# Patient Record
Sex: Female | Born: 1935
Health system: Southern US, Community
[De-identification: ages and names within clinical notes are randomized; demographics above are authoritative.]

## PROBLEM LIST (undated history)

## (undated) DIAGNOSIS — F419 Anxiety disorder, unspecified: Secondary | ICD-10-CM

## (undated) DIAGNOSIS — I10 Essential (primary) hypertension: Secondary | ICD-10-CM

## (undated) DIAGNOSIS — R200 Anesthesia of skin: Secondary | ICD-10-CM

## (undated) DIAGNOSIS — E785 Hyperlipidemia, unspecified: Secondary | ICD-10-CM

## (undated) DIAGNOSIS — E876 Hypokalemia: Secondary | ICD-10-CM

## (undated) DIAGNOSIS — R770 Abnormality of albumin: Secondary | ICD-10-CM

## (undated) HISTORY — DX: Anesthesia of skin: R20.0

## (undated) HISTORY — DX: Abnormality of albumin: R77.0

## (undated) HISTORY — DX: Essential (primary) hypertension: I10

## (undated) HISTORY — PX: LUMBAR DISC SURGERY: SHX700

## (undated) HISTORY — DX: Anxiety disorder, unspecified: F41.9

## (undated) HISTORY — DX: Hypokalemia: E87.6

## (undated) HISTORY — DX: Hyperlipidemia, unspecified: E78.5

## (undated) HISTORY — DX: Hypomagnesemia: E83.42

---

## 2003-07-18 ENCOUNTER — Emergency Department (HOSPITAL_COMMUNITY): Admission: EM | Admit: 2003-07-18 | Discharge: 2003-07-18 | Payer: Self-pay | Admitting: Emergency Medicine

## 2004-05-24 ENCOUNTER — Encounter: Admission: RE | Admit: 2004-05-24 | Discharge: 2004-05-24 | Payer: Self-pay | Admitting: Geriatric Medicine

## 2004-07-05 ENCOUNTER — Other Ambulatory Visit: Admission: RE | Admit: 2004-07-05 | Discharge: 2004-07-05 | Payer: Self-pay | Admitting: Geriatric Medicine

## 2005-03-25 ENCOUNTER — Encounter: Admission: RE | Admit: 2005-03-25 | Discharge: 2005-03-25 | Payer: Self-pay | Admitting: Geriatric Medicine

## 2005-04-21 ENCOUNTER — Inpatient Hospital Stay (HOSPITAL_COMMUNITY): Admission: EM | Admit: 2005-04-21 | Discharge: 2005-04-22 | Payer: Self-pay | Admitting: *Deleted

## 2005-08-07 ENCOUNTER — Encounter: Admission: RE | Admit: 2005-08-07 | Discharge: 2005-08-07 | Payer: Self-pay | Admitting: Geriatric Medicine

## 2005-12-11 ENCOUNTER — Encounter: Admission: RE | Admit: 2005-12-11 | Discharge: 2005-12-11 | Payer: Self-pay | Admitting: Geriatric Medicine

## 2006-10-09 ENCOUNTER — Other Ambulatory Visit: Admission: RE | Admit: 2006-10-09 | Discharge: 2006-10-09 | Payer: Self-pay | Admitting: Geriatric Medicine

## 2007-04-16 ENCOUNTER — Encounter: Admission: RE | Admit: 2007-04-16 | Discharge: 2007-04-16 | Payer: Self-pay | Admitting: Geriatric Medicine

## 2007-04-27 ENCOUNTER — Encounter: Admission: RE | Admit: 2007-04-27 | Discharge: 2007-04-27 | Payer: Self-pay | Admitting: Geriatric Medicine

## 2007-08-17 ENCOUNTER — Encounter: Admission: RE | Admit: 2007-08-17 | Discharge: 2007-08-17 | Payer: Self-pay | Admitting: Geriatric Medicine

## 2007-12-24 ENCOUNTER — Emergency Department (HOSPITAL_COMMUNITY): Admission: EM | Admit: 2007-12-24 | Discharge: 2007-12-24 | Payer: Self-pay | Admitting: Emergency Medicine

## 2008-10-24 ENCOUNTER — Other Ambulatory Visit: Admission: RE | Admit: 2008-10-24 | Discharge: 2008-10-24 | Payer: Self-pay | Admitting: Geriatric Medicine

## 2009-11-02 ENCOUNTER — Encounter: Admission: RE | Admit: 2009-11-02 | Discharge: 2009-11-02 | Payer: Self-pay | Admitting: Gastroenterology

## 2010-07-02 NOTE — H&P (Signed)
[name] [name], [name] [name]               ACCOUNT NO.:  [id number]   MEDICAL RECORD NO.:  [id number]          PATIENT TYPE:  AMB   LOCATION:  SDC                           FACILITY:  [facility]   PHYSICIAN:  Charles A. Delcambre, MDDATE OF BIRTH:  [birth year]   DATE OF ADMISSION:  DATE OF DISCHARGE:                              HISTORY & PHYSICAL   CHIEF COMPLAINT:  Cystocele.   HISTORY OF PRESENT ILLNESS:  A 77 year old para 1-0-0-1, postmenopausal  female who presents complaining of 1-2 months of a bulge at her  introitus that is bothersome, rubs on her clothes, becomes irritated,  and just a pressure type of feeling.  She has seen her PCP, Dr.  Pete Glatter and PA and Dorethea Clan, seen in the past but now it is getting  worse so she is referred here for management.  She has minimal stress  urinary incontinence and no bowel changes.   PAST MEDICAL HISTORY:  1. Hypertension.  2. Hypercholesterolemia.   MEDICATION:  1. Verapamil ER one and half tablets per day.  2. Quinapril 20 mg 2 tablets per day.  3. Hydrochlorothiazide 25 mg one half tablet daily.  4. Lipitor 20 mg daily.  5. Calcium 600 mg 2 tablets daily.  6. Aspirin 81 mg once daily.  7. Multivitamin once daily.  8. Omeprazole once daily.   SURGICAL HISTORY:  Lumbar fourth and fifth disease.   ALLERGIES:  PENICILLIN actually not specified.   SOCIAL HISTORY:  She no longer smokes.  She has smoked for 20 years.  No  alcohol or drug use.  She is not sexually active.   FAMILY HISTORY:  Hypertension and osteoporosis.   REVIEW OF SYSTEMS:  Stomach and bowel problems, history of colitis,  otherwise no chest pain, mitral valve, shortness of breath, chronic  cough, liver problems, gallbladder problems, depression, bleeding  disorder, anemia, breast lump or discharge or vaginal discharge, pain  with intercourse, history of STDs, acne or skin problems, no migraine  headaches, seizures, epilepsy, numbness in her arms and legs  (recurring), no  unexplained weight gain or weight loss, currently  without night sweats or hot flashes.   PHYSICAL EXAMINATION:  GENERAL:  Alert and oriented x3.  VITAL SIGNS:  Blood pressure 150/84, heart rate 60, respirations 18, and  weight 140 pounds.  CORONARY:  Regular rate and rhythm without murmur, rub or gallop.  LUNGS:  Clear bilaterally.  ABDOMEN:  Soft, flat, and nontender.  PELVIC:  Normal external female genitalia.  Bartholin, urethral and  Skene within normal limits.  Moderate-to-severe cystocele is noted  prolapsing out the introitus.  Palpating the vaginal vault, I can  palpate the cervix deep and high without descent with Valsalva and at  about 4-5 o'clock, there is a tight band almost going from the cervix to  the sidewall posteriorly almost like an exposed uterosacral ligament  would be if it wished to be so present.  Area was nontender.  Valsalva,  the uterus did not move downward and it was difficult to see because of  the cystocele   ASSESSMENT:  Cystocele.   PLAN:  I  discussed pessary versus expectant management versus surgical  management.  She opted for surgical management with the uterus high and  no significant rectocele will only proceed with cystocele repair.  She  will return for preoperative management excepting this heart and lung  exam, accepts risks of infection, bleeding, blood product risk including  hepatitis and HIV exposure, failed procedure, increased incontinence  rate, increased urge incontinence, bladder injury and postoperative  retaining catheter with inability to void transiently.  All questions  were answered and surgery is scheduled for October 11, 2007, at 12 noon.      Charles A. Sydnee Cabal, MD  Electronically Signed     CAD/MEDQ  D:  09/20/2007  T:  09/21/2007  Job:  [id number]

## 2010-07-05 NOTE — Discharge Summary (Signed)
[name] [name], [name] [name]               ACCOUNT NO.:  [id number]   MEDICAL RECORD NO.:  [id number]          PATIENT TYPE:  INP   LOCATION:  4740                         FACILITY:  [facility]   PHYSICIAN:  Lyn Records, M.D.   DATE OF BIRTH:  26-Aug-1929   DATE OF ADMISSION:  04/21/2005  DATE OF DISCHARGE:  04/22/2005                                 DISCHARGE SUMMARY   ADMISSION DIAGNOSIS:  1.  Left shoulder pain, left neck pain, left arm discomfort, and chest pain.   DISCHARGE DIAGNOSES:  1.  Left shoulder blade pain, left neck pain, left arm discomfort, and chest      pain, resolved.  2.  Dyspnea, resolved.  3.  Hypertension.  4.  Dyslipidemia.  5.  Gastroesophageal reflux disease.  6.  Chronic obstructive pulmonary disease-emphysema.   PROCEDURES:  Stress Cardiolite.   HOSPITAL COURSE:  [name] [name] is a 77 year old Caucasian female with no  known history of coronary artery disease; however, she does have a history  of hypertension, dyslipidemia, GERD, and COPD/emphysema.  She presented to  the emergency department on yesterday after a two-day history of nausea,  flatulence and bloating and then a sudden onset on Sunday evening of  nonexertional left shoulder blade pain rated a 7/10.  She also complained of  left neck pain, left arm discomfort and chest pain that was dull in quality  and rated 3/10.  Associated with the chest pain was shortness of breath,  diaphoresis, and dizziness without syncope.  The duration of the chest pain  was a few seconds.  The symptoms recurred on Monday morning; however, the  chest pain was rated a 4/10.  Upon arrival to the [facility] emergency  department by private car, she was given sublingual nitroglycerin x2 plus  aspirin 81 mg x4 plus Zofran 4 mg plus morphine 2 mg plus Dilaudid 1 mg/hr.,  with complete pain relief.  Her EKG in the emergency department revealed  normal sinus rhythm with a heart rate of 64 beats per minute.  The patient  reported  that her left shoulder pain was aggravated with movement and  palpation.  The patient also reported that she previously had a normal  thallium stress test in the past.  Her initial point of care enzymes were  negative x2 and she was currently pain-free.  On today, she underwent a  stress Cardiolite exercise test with normal results and an EF of 91%.  Her  serial enzymes including CK total, CK-MB and troponin I were negative x2.  The patient is being discharged to home today in stable condition with no  further cardiac workup needed.   LABORATORY DATA:  Most recent BMET:  Sodium 137, potassium 3.9, chloride 99,  CO2 29, glucose of 101, BUN 8, creatinine 0.8, calcium 9.7.  White blood  count 6.1, red blood count 4.84, hemoglobin 14.6, hematocrit 41.7, platelets  255.  Fasting lipid profile:  Total cholesterol 238, triglycerides 220, HDL  cholesterol 52, LDL cholesterol 142.  BNP less than 30.  Magnesium 2.5.  PT  12.2, INR 0.9, PTT 24.  X-RAYS:  Chest x-ray single view, April 21, 2005:  (1) Emphysema.  (2) No  acute cardiopulmonary process.   EKG on April 22, 2005:  Normal sinus rhythm with early repolarization.   CONDITION UPON DISCHARGE:  [name] [name] is currently left shoulder pain, left  neck pain, left arm discomfort and chest pain-free.  She has ambulated well  without angina and is being discharged to home today in stable condition.   DISCHARGE MEDICATIONS:  1.  Accupril 20 mg twice daily.  2.  Verapamil 240 mg one tablet in the a.m. and one-half tablet in the p.m.  3.  Lipitor 20 mg daily.  4.  Hydrochlorothiazide 25 mg one-half tablet daily.  5.  Aspirin 81 mg daily.  6.  Nexium 40 mg daily.  7.  Calcium 600 mg three times daily.  8.  Vitamin E 400 international units daily.  9.  Vitamin B daily.   DISCHARGE INSTRUCTIONS:  It is recommended that the patient follow up with  her primary care physician, Hal T. Stoneking, M.D.  The patient has been  instructed to follow a  low-salt and low-cholesterol diet.   FOLLOW-UP ARRANGEMENTS:  1.  There is no further cardiac workup necessary.  2.  The patient has been instructed to follow up with her primary care      physician, Dr. Merlene Laughter.  The patient will need to schedule an      appointment with Dr. Merlene Laughter.      Tylene Fantasia, [place]      Lyn Records, M.D.  Electronically Signed    RDM/MEDQ  D:  04/22/2005  T:  04/23/2005  Job:  [id number]   cc:   Hal T. Stoneking, M.D.  Fax: [phone]

## 2010-07-05 NOTE — H&P (Signed)
NAMESHIRLINE, [name] [name]               ACCOUNT NO.:  [id number]   MEDICAL RECORD NO.:  [id number]          PATIENT TYPE:  INP   LOCATION:  4740                         FACILITY:  [facility]   PHYSICIAN:  Lyn Records, M.D.   DATE OF BIRTH:  January 23, 1930   DATE OF ADMISSION:  04/21/2005  DATE OF DISCHARGE:                                HISTORY & PHYSICAL   CARDIOLOGIST:  Verdis Prime, M.D.   PRIMARY CARE PHYSICIAN:  Merlene Laughter, M.D.   CHIEF COMPLAINT:  Left shoulder pain, left arm discomfort, and chest pain.   HISTORY AND PHYSICAL:  [name] [name] is a 77 year old Caucasian female with  no known history of coronary artery disease; however, she does have a  history of hypertension, dyslipidemia, and GERD.  She complained of a two-  day history of nausea, flatulence, and bloating.  On yesterday at 5 p.m. she  had a sudden onset of non-exertional left shoulder blade pain.  The pain  felt as if like someone was punching me in the back.  She rated the pain a  7/10.  She also complained of left neck pain as well as left arm discomfort.  She denies heaviness, numbness, or tingling in her left arm.  She also  complained of a dull ache in her chest rated a 3/10.  Accompanied with the  chest pain was shortness of breath, diaphoresis, and dizziness.  She denied  syncope.  The duration of the chest pain was a few seconds.  This morning  the symptoms recurred; however, the chest pain was rated a 4/10.  She  arrived to the [facility] Emergency Department by private car.  Upon arrival  she was given sublingual nitroglycerin x2 plus aspirin 81 mg x4 plus Zofran  4 mg plus morphine 2 mg plus Dilaudid 1 mg per hour with complete pain  relief.  Upon arrival an EKG was obtained and revealed normal sinus rhythm  with a ventricular rate of 64 beats per minute.  Point of care enzymes were  negative x2.  The patient admitted to currently being pain-free; however,  the left shoulder pain is aggravated with movement  and with palpation.  The  patient also reported a normal thallium stress test in the past.   PAST MEDICAL HISTORY:  1.  Hypertension.  2.  Dyslipidemia.  3.  GERD.   ALLERGIES:  PENICILLIN, questionable CONTRAST DYE allergy.   HOME MEDICATIONS:  1.  Verapamil 360 mg daily.  2.  Lipitor 20 mg daily.  3.  Accupril 20 mg daily.  4.  Hydrochlorothiazide 25 mg daily.  5.  Miacalcin.  6.  Calcium chloride.  7.  Aspirin 81 mg daily.   PAST SURGICAL HISTORY:  Lower back surgery L4-L5 in 1987.   FAMILY HISTORY:  Father deceased age 28, cancer.  Mother deceased age 38,  CHF, bradycardia status post permanent pacemaker, hypertension.  Sister  deceased, emphysema.   SOCIAL HISTORY:  [name] [name] is married and lives with her husband in a  retirement community in [place].  The couple retired to [place] two  years ago from [place],  [place].  [name] [name] walks three miles a  day five times per week.  She was a former tobacco user, however, smoking  cessation occurred 15 years ago.  She denies alcohol and illicit drug use.   REVIEW OF SYSTEMS:  Positive for chest pain, shortness of breath,  diaphoresis, dizziness, nausea, flatulence, left shoulder blade pain, left  neck pain, left arm discomfort, left leg and knee pain.  She denies vomiting  and syncope.  All other systems reviewed are negative other than what is  stated in the HPI.   PHYSICAL EXAMINATION:  GENERAL:  [name] [name] is a 77 year old Caucasian  female who is pleasant and cooperative and NAD.  VITAL SIGNS:  Temperature 96.8, pulse 54, respirations 18, blood pressure  140/72, O2 saturation 98% over 4 L.  HEENT:  Unremarkable.  NECK:  No JVD or carotid bruits.  LUNGS:  Breath sounds are equal and clear to auscultation without wheezes,  rhonchi, crackles, or rales.  HEART:  Regular rate and rhythm.  S1 and S2 normal without murmurs, gallops,  clicks, or rubs.  ABDOMEN:  Soft, nontender, nondistended with active  bowel sounds.  No aortic  or iliac bruits.  No masses or hepatomegaly.  EXTREMITIES:  No peripheral edema.  DP and PT pulses 2+/2 bilaterally.  SKIN:  Warm and dry without rashes or lesions.  BACK:  No kyphosis or scoliosis.  NEUROLOGIC:  Alert and oriented x3.  No focal deficits.  Normal gait.  PSYCH:  Normal mood and affect.   LABORATORY DATA:  Sodium 136, potassium 3.9, chloride 104, CO2 30.4, BUN 12,  creatinine 0.4, glucose 95.  Point of care cardiac markers:  Myoglobin 82.8  and 57.3, respectively, CK-MB 3.1 and 1.3, respectively, troponin I less  than 0.05 and less than 0.05, respectively.  Chest x-ray:  COPD, emphysema  noted.  No cardiopulmonary disease process noted.  EKG:  Normal sinus rhythm  with a ventricular rate of 64 BPM.   ASSESSMENT:  1.  Chest pain.  2.  Dyspnea.  3.  Left arm pain.  4.  Left neck pain.  5.  Left shoulder pain.  6.  Hypertension.  7.  Dyslipidemia.  8.  Gastroesophageal reflux disease.  9.  Chronic obstructive pulmonary disease/emphysema.   PLAN:  1.  Rule out MI.  Obtain serial cardiac enzymes now and every eight hours      including CK total, CK-MB, and troponin I.  2.  Stress Cardiolite test in the a.m.  n.p.o. after midnight on April 21, 2005 except medications.  3.  EKG in the a.m. and with recurrent chest/back pain.  4.  Check BMET, PT, INR, PTT.  5.  Sublingual nitroglycerin 0.4 mg every five minutes x3 p.r.n. chest/back      pain.  6.  Initiate cardiac p.r.n. orders.  7.  Admit to a cardiac telemetry unit to Dr. Corky Sing service.      Diagnosis:  Unstable angina.  8.  Check CBC with differential.  9.  Check BNP.  10. Hold verapamil.  11. Start Norvasc 5 mg daily this p.m. in preparation for stress Cardiolite.  12. Start Protonix 40 mg daily.      Tylene Fantasia, [place]      Lyn Records, M.D.  Electronically Signed    RDM/MEDQ  D:  04/21/2005  T:  04/22/2005  Job:  [id number]  cc:   Hal T. Stoneking, M.D.   Fax: [phone]

## 2010-10-15 ENCOUNTER — Emergency Department (HOSPITAL_COMMUNITY): Payer: Medicare Other

## 2010-10-15 ENCOUNTER — Encounter (HOSPITAL_COMMUNITY): Payer: Self-pay

## 2010-10-15 ENCOUNTER — Inpatient Hospital Stay (HOSPITAL_COMMUNITY)
Admission: EM | Admit: 2010-10-15 | Discharge: 2010-10-17 | DRG: 641 | Disposition: A | Discharge status: Home / Self Care | Payer: Medicare Other | Attending: Internal Medicine | Admitting: Internal Medicine

## 2010-10-15 DIAGNOSIS — R209 Unspecified disturbances of skin sensation: Secondary | ICD-10-CM | POA: Diagnosis present

## 2010-10-15 DIAGNOSIS — E8809 Other disorders of plasma-protein metabolism, not elsewhere classified: Secondary | ICD-10-CM | POA: Diagnosis present

## 2010-10-15 DIAGNOSIS — Z7982 Long term (current) use of aspirin: Secondary | ICD-10-CM

## 2010-10-15 DIAGNOSIS — F411 Generalized anxiety disorder: Secondary | ICD-10-CM | POA: Diagnosis present

## 2010-10-15 DIAGNOSIS — Z79899 Other long term (current) drug therapy: Secondary | ICD-10-CM

## 2010-10-15 DIAGNOSIS — I10 Essential (primary) hypertension: Secondary | ICD-10-CM | POA: Diagnosis present

## 2010-10-15 DIAGNOSIS — D45 Polycythemia vera: Secondary | ICD-10-CM | POA: Diagnosis present

## 2010-10-15 DIAGNOSIS — E876 Hypokalemia: Principal | ICD-10-CM | POA: Diagnosis present

## 2010-10-15 DIAGNOSIS — Z87891 Personal history of nicotine dependence: Secondary | ICD-10-CM

## 2010-10-15 DIAGNOSIS — E785 Hyperlipidemia, unspecified: Secondary | ICD-10-CM | POA: Diagnosis present

## 2010-10-15 DIAGNOSIS — R1310 Dysphagia, unspecified: Secondary | ICD-10-CM | POA: Diagnosis present

## 2010-10-15 DIAGNOSIS — E860 Dehydration: Secondary | ICD-10-CM | POA: Diagnosis present

## 2010-10-15 DIAGNOSIS — K219 Gastro-esophageal reflux disease without esophagitis: Secondary | ICD-10-CM | POA: Diagnosis present

## 2010-10-15 HISTORY — DX: Essential (primary) hypertension: I10

## 2010-10-15 LAB — URINALYSIS, ROUTINE W REFLEX MICROSCOPIC
Ketones, ur: NEGATIVE mg/dL
Nitrite: NEGATIVE
Protein, ur: NEGATIVE mg/dL

## 2010-10-15 LAB — CALCIUM: Calcium: 5.6 mg/dL — CL (ref 8.4–10.5)

## 2010-10-15 LAB — BASIC METABOLIC PANEL
BUN: 7 mg/dL (ref 6–23)
BUN: 12 mg/dL (ref 6–23)
CO2: 18 mEq/L — ABNORMAL LOW (ref 19–32)
CO2: 27 mEq/L (ref 19–32)
Calcium: 5.4 mg/dL — CL (ref 8.4–10.5)
Calcium: 9.8 mg/dL (ref 8.4–10.5)
Chloride: 119 mEq/L — ABNORMAL HIGH (ref 96–112)
Creatinine, Ser: <0.47 mg/dL — ABNORMAL LOW (ref 0.50–1.10)
Glucose, Bld: 68 mg/dL — ABNORMAL LOW (ref 70–99)
Glucose, Bld: 105 mg/dL — ABNORMAL HIGH (ref 70–99)
Sodium: 139 mEq/L (ref 135–145)

## 2010-10-15 LAB — POTASSIUM: Potassium: 2.1 mEq/L — CL (ref 3.5–5.1)

## 2010-10-15 LAB — DIFFERENTIAL
Eosinophils Absolute: 0.1 10*3/uL (ref 0.0–0.7)
Lymphs Abs: 2.4 10*3/uL (ref 0.7–4.0)
Monocytes Absolute: 0.4 10*3/uL (ref 0.1–1.0)
Monocytes Relative: 7 % (ref 3–12)
Neutro Abs: 2 10*3/uL (ref 1.7–7.7)
Neutrophils Relative %: 41 % — ABNORMAL LOW (ref 43–77)

## 2010-10-15 LAB — CBC
Hemoglobin: 15.8 g/dL — ABNORMAL HIGH (ref 12.0–15.0)
MCH: 29.2 pg (ref 26.0–34.0)
MCHC: 35 g/dL (ref 30.0–36.0)
MCV: 83.4 fL (ref 78.0–100.0)
RBC: 5.41 MIL/uL — ABNORMAL HIGH (ref 3.87–5.11)

## 2010-10-15 LAB — MAGNESIUM: Magnesium: 1.3 mg/dL — ABNORMAL LOW (ref 1.5–2.5)

## 2010-10-15 LAB — TROPONIN I: Troponin I: <0.3 ng/mL (ref <0.30)

## 2010-10-15 LAB — ALBUMIN: Albumin: 2.3 g/dL — ABNORMAL LOW (ref 3.5–5.2)

## 2010-10-15 LAB — CK TOTAL AND CKMB (NOT AT ARMC): Total CK: 69 U/L (ref 7–177)

## 2010-10-15 LAB — URINE MICROSCOPIC-ADD ON

## 2010-10-15 NOTE — H&P (Signed)
NAMEPATRICIANN, BECHT               ACCOUNT NO.:  [id number]  MEDICAL RECORD NO.:  [id number]  LOCATION:  [facility]                         FACILITY:  [facility]  PHYSICIAN:  Andreas Blower, MD       DATE OF BIRTH:  Aug 06, 1929  DATE OF ADMISSION:  10/15/2010 DATE OF DISCHARGE:                             HISTORY & PHYSICAL   PRIMARY CARE PHYSICIAN:  Hal T. Stoneking, MD  CHIEF COMPLAINT:  Altered sensation on her left side.  HISTORY OF PRESENT ILLNESS:  Ms. Spadoni is a 77 year old Caucasian female with history of hypertension, hyperlipidemia, and GERD, who presented with the above complaints.  The patient reported that about 4 or 5 weeks ago she had gone to her primary care physician's office Dr. Pete Glatter at which time lab showed that she had elevated calcium.  She had cut down her calcium from 2 tablets daily to 1 tablet a day.  Also over the last month or so she has been having some pain in her left thigh and left knee.  She had gone to Dr. Ardyth Gal office with the Orthopedic Service and had workup done which was negative.  Over the last 2 days she has been feeling dizzy at home, but has not fallen or has not lost consciousness.  She woke up this morning at 4 o'clock with having altered sensation on the left side.  She also checked her blood pressure which was elevated at 210/100.  She took her home medications and as a result presented to the ER for further evaluation.  In the ER, the patient had MRI and MRA which did show any acute infarct.  However, labs did show that she was hypokalemic with a potassium of less than 2.0 and hypocalcemia with calcium of 5.4.  As a result, the Hospitalist Service was asked to admit the patient for further care and management. The patient denies any recent fevers or chills.  Denies any nausea or vomiting.  The patient while in the emergency department choked on the potassium tablet and Heimlich maneuver was performed.  Denies any abdominal pain.  Denies  any diarrhea, headaches, or vision changes.  REVIEW OF SYSTEMS:  All systems were reviewed with the patient was positive as per HPI, otherwise all other systems are negative.  PAST MEDICAL HISTORY: 1. Hypertension. 2. Hyperlipidemia. 3. GERD.  SOCIAL HISTORY:  The patient quit smoking about 20 years ago.  Denies any illegal drugs or substances.  Drinks one glass of wine every 2 weeks.  Lives at home with husband, who recently had surgery and had a lung resection.  FAMILY HISTORY:  Significant for mother dying from congestive heart failure at age 45.  Father died at the age of 47, likely from colon cancer.  Sister had COPD.  PHYSICAL EXAMINATION:  VITALS:  Temperature was 97.7, blood pressure is 140/57, heart rate 73, respirations 18, satting at 97% on 3 liters of oxygen. GENERAL:  The patient is alert, oriented, not appeared to be in acute distress, lying in bed comfortably. HEENT:  Extraocular motions are intact.  Pupils equal and round.  Had moist membranes. No oropharyn lesions. NECK:  Supple. HEART:  Regular with S1 and S2. LUNGS:  Clear to auscultation bilaterally. ABDOMEN:  Soft, nontender, nondistended.  Positive bowel sounds. EXTREMITIES:  The patient had good peripheral pulses with trace edema. NEURO:  Cranial nerves II-XII are grossly intact.  Had 5/5 motor strength in upper as well as lower extremities.  Trousseau and Chvostek sign was negative.  RADIOLOGY/IMAGING: 1. The patient had a head CT which was negative for bleed or other     acute intracranial process.  Right basal ganglia was small of     lacunar infarct. 2. The patient had an MRI and MRA of brain which was negative.  It     did show chronic ischemic changes.  LABORATORY DATA:  CBC shows a white count of 4.9, hemoglobin 15.8, hematocrit 45.1, and platelet count 181.  Electrolytes, sodium 143, potassium less than 2.0, chloride 119, CO2 of 18, BUN 7, creatinine less than 0.47, and calcium 5.4.   Initial troponin less than 0.30.  UA negative for nitrites and trace leukocytes.  ASSESSMENT AND PLAN: 1. Hypocalcemia, etiology unclear at this time.  The patient reports     that she has been taking supplemental calcium at home.  However,     has decreased the dose as her laboratory workup about for 5 weeks     ago showed hypercalcemia.  We will initiate hypokalemia workup, we     will send for phosphorus, albumin, PTH, vitamin B, 25 hydroxy, 24-     hour urine calcium, and LDH.  The patient's Chvostek and Trousseau     sign were negative.  We will give the patient calcium gluconate and     will observe. 2. Hypokalemia, etiology unclear.  We will send for a magnesium level.     We will replace the potassium aggressive with both IV and oral     potassium. 3. Dysphagia.  The patient showed on potassium pills.  We will change     oral potassium tablets to liquid.  We will also have the patient on     soft diet.  If the patient continued to have dysphagia, can consider     dysphagia workup with a speech therapy at that time.  The patient     was drinking and swallowing appropriately while she was in the     room. 4. Altered sensation on the left side likely due to hypokalemia     calcium and hypokalemia as the MRI is negative.  On my examination     with the patient, her sensation had improved. If the patient has     persistent symptoms, consider further evaluation with perhaps     Neurology consultation. 5. Hypertension not well controlled.  The patient is on verapamil     which will be held as the patient has hypocalcemia.  We will have     p.r.n. hydralazine.  The patient has accelerated hypertension,     likely due to stress from being here in the hospital.  We will     monitor blood pressure carefully and adjust medications as     needed. 6. Hyperlipidemia.  Continue statin. 7. Polycythemia suspect the patient may be mildly dehydrated. 8. Questionable dehydration.  We will check  orthostatics on admission.     We will hydrate the patient on IV fluids. 9. Gastroesophageal reflux disease.  Continue the patient on PPI. 10.Prophylaxis, Lovenox for DVT prophylaxis. 11.Code status.  The patient is full code.  TIME SPENT ON ADMISSION:  Talking to the patient's family and coordinating care was  1 hour.     Andreas Blower, MD     SR/MEDQ  D:  10/15/2010  T:  10/15/2010  Job:  [id number]  Electronically Signed by Wardell Heath Sharif Rendell  on 10/15/2010 11:33:21 PM

## 2010-10-16 LAB — CBC
Hemoglobin: 13.9 g/dL (ref 12.0–15.0)
MCHC: 34.1 g/dL (ref 30.0–36.0)
RDW: 13.1 % (ref 11.5–15.5)

## 2010-10-16 LAB — MAGNESIUM
Magnesium: 2.5 mg/dL (ref 1.5–2.5)
Magnesium: 2.5 mg/dL (ref 1.5–2.5)

## 2010-10-16 LAB — COMPREHENSIVE METABOLIC PANEL
Alkaline Phosphatase: 92 U/L (ref 39–117)
BUN: 10 mg/dL (ref 6–23)
Creatinine, Ser: 0.54 mg/dL (ref 0.50–1.10)
GFR calc Af Amer: >60 mL/min (ref >60)
Glucose, Bld: 112 mg/dL — ABNORMAL HIGH (ref 70–99)
Potassium: 4.1 mEq/L (ref 3.5–5.1)
Total Bilirubin: 0.4 mg/dL (ref 0.3–1.2)
Total Protein: 7.1 g/dL (ref 6.0–8.3)

## 2010-10-16 LAB — VITAMIN D 25 HYDROXY (VIT D DEFICIENCY, FRACTURES): Vit D, 25-Hydroxy: 44 ng/mL (ref 30–89)

## 2010-10-17 LAB — BASIC METABOLIC PANEL
GFR calc Af Amer: >60 mL/min (ref >60)
GFR calc non Af Amer: >60 mL/min (ref >60)
Potassium: 4.1 mEq/L (ref 3.5–5.1)
Sodium: 135 mEq/L (ref 135–145)

## 2010-10-17 LAB — LIPID PANEL
Cholesterol: 196 mg/dL (ref 0–200)
HDL: 47 mg/dL (ref >39)
Triglycerides: 203 mg/dL — ABNORMAL HIGH (ref <150)

## 2010-10-17 LAB — CBC
MCHC: 33.8 g/dL (ref 30.0–36.0)
RDW: 13.2 % (ref 11.5–15.5)

## 2010-10-17 LAB — VITAMIN B12: Vitamin B-12: 916 pg/mL — ABNORMAL HIGH (ref 211–911)

## 2010-10-17 LAB — CALCIUM, URINE, 24 HOUR: Calcium, 24 hour urine: 84 mg/d — ABNORMAL LOW (ref 100–250)

## 2010-10-19 LAB — PTH-RELATED PEPTIDE

## 2010-11-06 NOTE — Discharge Summary (Signed)
[name], [name]               ACCOUNT NO.:  [id number]  MEDICAL RECORD NO.:  [id number]  LOCATION:  3703                         FACILITY:  [facility]  PHYSICIAN:  Rosanna Randy, MDDATE OF BIRTH:  Nov 02, 1929  DATE OF ADMISSION:  10/15/2010 DATE OF DISCHARGE:  10/17/2010                              DISCHARGE SUMMARY   DISCHARGE DIAGNOSES: 1. Accelerated hypertension. 2. Hypokalemia. 3. Hypomagnesemia. 4. Hypokalemia. 5. Hyperlipidemia. 6. Mild low albumin. 7. Gastroesophageal reflux disease. 8. Anxiety/moderate to severe stress. 9. Numbness of the left side secondary to electrolytes abnormalities.  DISCHARGE MEDICATIONS: 1. Xanax 0.25 mg to take 1 tablet by mouth q.12 h as needed for     anxiety. 2. Ensure 237 mL by mouth daily. 3. HCTZ 25 mg 1 tablet by mouth daily. 4. Amlodipine 10 mg 1 tablet by mouth daily. 5. Aspirin 81 mg 1 tablet by mouth daily. 6. Co-Q 10 over the counter 1 tablet by mouth daily. 7. Crestor 5 mg 1 tablet by mouth daily. 8. Fish oil 1 capsule by mouth daily. 9. Metronidazole cream 0.75% one application transdermally daily to     the affected area on her skin. 10.Multivitamins 1 tablet by mouth daily. 11.Omeprazole 20 mg 1 capsule by mouth daily. 12.Quinapril  2 tablets by mouth daily.  DISPOSITION AND FOLLOWUP:  The patient had been discharged in a stable improved condition:  Not having any acute complaints.  She is going to arrange a followup appointment with primary care physician Dr. Pete [name] over the next 7-10 days.  During that appointment, will be important to repeat a basic metabolic panel to make sure that no further abnormalities of the range into her electrolyte to renal function has happened since discharge.  The patient will also need a followup of her blood pressure and have further adjustment on her medications depending the level.  The patient was advised to follow a low-fat diet and also to follow a low-sodium diet in  order to help with her cholesterol and blood pressure problems.  PROCEDURE PERFORMED DURING THIS HOSPITALIZATION:  On August 28th she had a CT of the head without contrast that demonstrated no acute bleeding or intracranial process.  There was a right basal ganglia small lacunar infarct, age indeterminate and nonspecific deep white matter changes observed during this study.  An MRI of the brain demonstrated chronic ischemic changes without any acute infarct and the MRA was negative.  A chest x-ray done August 28th demonstrated chronic lung changes without acute borderline pulmonary process.  No other procedures were performed during this hospitalization.  No consultations were made during this hospitalization.  HISTORY OF PRESENT ILLNESS:  For full details, please refer to dictation done by Dr. [name] on October 15, 2010, but briefly, this is a 77 year old female with past medical history significant for hypertension, hyperlipidemia and gastroesophageal reflux disease, who presents with an altered sensation on the left side of her body and headaches.  The patient woke up on the day of admission reporting that she had been feeling a little bit dizzy at home, but she did not had any falls or loss of consciousness.  This is something that had been done for the last  2 days prior to admission.  She woke up on the day of admission around 4 o'clock with having altered sensation of the left side and also headache, blood pressure was checked at that time and was 210/100.  The patient took her medications that she normally take for blood pressure, even that it was slightly to be ahead of time for her usual pattern and come to the emergency department for further evaluation and treatment. The patient was admitted for further evaluation.  PERTINENT LABORATORY DATA THROUGHOUT THIS HOSPITALIZATION:  The patient had a CBC with differential on admission with a white blood cells of 4.9, hemoglobin  15.8, platelets 181.  Urinalysis was essentially negative with a microscopy showing 0-2 white blood cells and 0-2 red blood cells.  Cardiac markers negative x3.  Her BMET on admission showed sodium of 143, potassium 2.0, chloride 119, bicarb 18, glucose 68, BUN 7, creatinine 0.47, calcium 5.4, magnesium was 1.3, albumin 2.3, LDH 148, phosphorus was 1.6.  The patient had subsequent BMET and CBC throughout this hospitalization following on cardiac lab work with a BMET at discharge showing a sodium of 135, potassium 4.1, chloride 99, bicarb 25, glucose 103, BUN 16, creatinine 0.68, calcium was 10.6.  A lipid profile done during this admission showed a total cholesterol 196, triglycerides 203, HDL of 47, LDL 108, and magnesium at discharge 2.4 and vitamin D was 44.  Magnesium at discharge 2.5.  HOSPITAL COURSE BY PROBLEMS: 1. The patient's accelerated hypertension, she was treated with     hydralazine and adjustment has been made to her medications to make     easier and to followup as an outpatient.  We have adjust her HCTZ     to 25 mg daily.  We have changed her verapamil to Norvasc 10 mg     daily and she is going to continue using her lisinopril.  She will     follow a low-sodium diet and will follow with primary care     physician Dr. Pete [name] for further adjustment as an outpatient. 2. Hypomagnesemia, hypokalemia, electrolytes were repleted during this     admission. 3. Hypocalcemia which was also repleted and back to normal.  She had a     PTH check with value of 38.6, vitamin D of 44, at this point, after     repletion calcium is a baseline.  Plan is to have a calcium     repeated as an outpatient, at the moment that she will follow with     Dr. Pete [name] and have further adjustment and treatment if needed. 4. Hyperlipidemia.  A fasting lipid profile demonstrated well control     of her cholesterol level.  She will continue using her statins and     also Co-Q10. 5.  Gastroesophageal reflux disease.  We are going to continue PPI. 6. Low albumin.  We are going to recommend the use of supplemental     Ensure. 7. Anxiety/stress all driven by the situation that she is right now     involved with a disease of her husband who had been diagnosed with     a lung cancer and is status post a lobectomy of the right lung.  At     this point, we had started treatment with low dose Xanax q.12 h as     needed, but is something that needs to be followed on adjusted     depending improvement by Dr. Pete [name] as an outpatient. 8. Numbness of the  left side of her body, almost likely secondary to     the electrolytes abnormalities, specifically the low potassium and     low calcium.  Workup including CT and MRI to rule out strokes were     negative.  At this point, no further treatment or workup will be     needed for this problem.  Discharge Vital Signs:  Temperature 97.8, heart rate 73, respiratory rate 16, blood pressure 144/82, oxygen saturation 96% on room air.  In general, the patient in no acute distress.  Respiratory system, clear to auscultation bilaterally with just a scattered rhonchi.  Heart: Regular rate and rhythm.  No murmurs or gallops.  Abdomen: Soft, nontender with positive bowel sounds.  Extremities: There was no edema or cyanosis.  Neurologic exam was nonfocal.     Rosanna Randy, MD     CEM/MEDQ  D:  10/17/2010  T:  10/17/2010  Job:  [id number]  cc:   Hal T. Stoneking, M.D.  Electronically Signed by Vassie Loll MD on 11/06/2010 08:18:53 AM

## 2010-11-19 LAB — URINALYSIS, ROUTINE W REFLEX MICROSCOPIC
Bilirubin Urine: NEGATIVE
Nitrite: NEGATIVE
Specific Gravity, Urine: 1.006
Urobilinogen, UA: 0.2

## 2010-11-19 LAB — CBC
HCT: 44
Platelets: 234
RDW: 13.3

## 2010-11-19 LAB — DIFFERENTIAL
Basophils Absolute: 0.1
Eosinophils Absolute: 0.1
Eosinophils Relative: 1
Lymphocytes Relative: 22

## 2010-11-19 LAB — POCT I-STAT, CHEM 8
BUN: 12
Hemoglobin: 15
Sodium: 141
TCO2: 30

## 2010-11-19 LAB — URINE MICROSCOPIC-ADD ON

## 2011-03-05 DIAGNOSIS — R198 Other specified symptoms and signs involving the digestive system and abdomen: Secondary | ICD-10-CM | POA: Diagnosis not present

## 2011-03-11 DIAGNOSIS — R252 Cramp and spasm: Secondary | ICD-10-CM | POA: Diagnosis not present

## 2011-03-11 DIAGNOSIS — R300 Dysuria: Secondary | ICD-10-CM | POA: Diagnosis not present

## 2011-03-11 DIAGNOSIS — R109 Unspecified abdominal pain: Secondary | ICD-10-CM | POA: Diagnosis not present

## 2011-03-11 DIAGNOSIS — M810 Age-related osteoporosis without current pathological fracture: Secondary | ICD-10-CM | POA: Diagnosis not present

## 2011-03-11 DIAGNOSIS — I10 Essential (primary) hypertension: Secondary | ICD-10-CM | POA: Diagnosis not present

## 2011-03-11 DIAGNOSIS — Z79899 Other long term (current) drug therapy: Secondary | ICD-10-CM | POA: Diagnosis not present

## 2011-03-21 ENCOUNTER — Other Ambulatory Visit: Payer: Self-pay | Admitting: Gastroenterology

## 2011-03-21 DIAGNOSIS — R109 Unspecified abdominal pain: Secondary | ICD-10-CM | POA: Diagnosis not present

## 2011-03-25 ENCOUNTER — Ambulatory Visit
Admission: RE | Admit: 2011-03-25 | Discharge: 2011-03-25 | Disposition: A | Discharge status: Home / Self Care | Payer: Medicare Other | Source: Ambulatory Visit | Attending: Gastroenterology | Admitting: Gastroenterology

## 2011-03-25 DIAGNOSIS — R109 Unspecified abdominal pain: Secondary | ICD-10-CM | POA: Diagnosis not present

## 2011-03-25 DIAGNOSIS — K5730 Diverticulosis of large intestine without perforation or abscess without bleeding: Secondary | ICD-10-CM | POA: Diagnosis not present

## 2011-03-25 DIAGNOSIS — N281 Cyst of kidney, acquired: Secondary | ICD-10-CM | POA: Diagnosis not present

## 2011-03-28 DIAGNOSIS — M810 Age-related osteoporosis without current pathological fracture: Secondary | ICD-10-CM | POA: Diagnosis not present

## 2011-04-01 ENCOUNTER — Other Ambulatory Visit (HOSPITAL_COMMUNITY)
Admission: RE | Admit: 2011-04-01 | Discharge: 2011-04-01 | Disposition: A | Discharge status: Home / Self Care | Payer: Medicare Other | Source: Ambulatory Visit | Attending: Geriatric Medicine | Admitting: Geriatric Medicine

## 2011-04-01 ENCOUNTER — Other Ambulatory Visit: Payer: Self-pay | Admitting: Geriatric Medicine

## 2011-04-01 DIAGNOSIS — Z1159 Encounter for screening for other viral diseases: Secondary | ICD-10-CM | POA: Insufficient documentation

## 2011-04-01 DIAGNOSIS — N949 Unspecified condition associated with female genital organs and menstrual cycle: Secondary | ICD-10-CM | POA: Diagnosis not present

## 2011-04-01 DIAGNOSIS — Z124 Encounter for screening for malignant neoplasm of cervix: Secondary | ICD-10-CM | POA: Insufficient documentation

## 2011-04-01 DIAGNOSIS — I10 Essential (primary) hypertension: Secondary | ICD-10-CM | POA: Diagnosis not present

## 2011-04-04 ENCOUNTER — Other Ambulatory Visit: Payer: Self-pay | Admitting: Dermatology

## 2011-04-04 DIAGNOSIS — L723 Sebaceous cyst: Secondary | ICD-10-CM | POA: Diagnosis not present

## 2011-04-07 DIAGNOSIS — Z800 Family history of malignant neoplasm of digestive organs: Secondary | ICD-10-CM | POA: Diagnosis not present

## 2011-04-07 DIAGNOSIS — R109 Unspecified abdominal pain: Secondary | ICD-10-CM | POA: Diagnosis not present

## 2011-04-07 DIAGNOSIS — Z86010 Personal history of colonic polyps: Secondary | ICD-10-CM | POA: Diagnosis not present

## 2011-04-29 DIAGNOSIS — N8111 Cystocele, midline: Secondary | ICD-10-CM | POA: Diagnosis not present

## 2011-04-29 DIAGNOSIS — R110 Nausea: Secondary | ICD-10-CM | POA: Diagnosis not present

## 2011-04-29 DIAGNOSIS — I10 Essential (primary) hypertension: Secondary | ICD-10-CM | POA: Diagnosis not present

## 2011-04-29 DIAGNOSIS — R300 Dysuria: Secondary | ICD-10-CM | POA: Diagnosis not present

## 2011-06-11 DIAGNOSIS — H01009 Unspecified blepharitis unspecified eye, unspecified eyelid: Secondary | ICD-10-CM | POA: Diagnosis not present

## 2011-06-11 DIAGNOSIS — H353190 Nonexudative age-related macular degeneration, unspecified eye, stage unspecified: Secondary | ICD-10-CM | POA: Diagnosis not present

## 2011-06-11 DIAGNOSIS — H40019 Open angle with borderline findings, low risk, unspecified eye: Secondary | ICD-10-CM | POA: Diagnosis not present

## 2011-06-11 DIAGNOSIS — H04129 Dry eye syndrome of unspecified lacrimal gland: Secondary | ICD-10-CM | POA: Diagnosis not present

## 2011-06-11 DIAGNOSIS — Z961 Presence of intraocular lens: Secondary | ICD-10-CM | POA: Diagnosis not present

## 2011-06-11 DIAGNOSIS — H26499 Other secondary cataract, unspecified eye: Secondary | ICD-10-CM | POA: Diagnosis not present

## 2011-07-01 DIAGNOSIS — M25559 Pain in unspecified hip: Secondary | ICD-10-CM | POA: Diagnosis not present

## 2011-08-12 DIAGNOSIS — R142 Eructation: Secondary | ICD-10-CM | POA: Diagnosis not present

## 2011-08-12 DIAGNOSIS — R141 Gas pain: Secondary | ICD-10-CM | POA: Diagnosis not present

## 2011-08-12 DIAGNOSIS — I10 Essential (primary) hypertension: Secondary | ICD-10-CM | POA: Diagnosis not present

## 2011-08-12 DIAGNOSIS — H6120 Impacted cerumen, unspecified ear: Secondary | ICD-10-CM | POA: Diagnosis not present

## 2011-08-12 DIAGNOSIS — E7800 Pure hypercholesterolemia, unspecified: Secondary | ICD-10-CM | POA: Diagnosis not present

## 2011-08-12 DIAGNOSIS — Z79899 Other long term (current) drug therapy: Secondary | ICD-10-CM | POA: Diagnosis not present

## 2011-08-18 DIAGNOSIS — N390 Urinary tract infection, site not specified: Secondary | ICD-10-CM | POA: Diagnosis not present

## 2011-09-11 DIAGNOSIS — E7800 Pure hypercholesterolemia, unspecified: Secondary | ICD-10-CM | POA: Diagnosis not present

## 2011-09-21 ENCOUNTER — Emergency Department (HOSPITAL_COMMUNITY)
Admission: EM | Admit: 2011-09-21 | Discharge: 2011-09-21 | Disposition: A | Discharge status: Home / Self Care | Payer: Medicare Other | Attending: Emergency Medicine | Admitting: Emergency Medicine

## 2011-09-21 ENCOUNTER — Emergency Department (HOSPITAL_COMMUNITY): Payer: Medicare Other

## 2011-09-21 ENCOUNTER — Encounter (HOSPITAL_COMMUNITY): Payer: Self-pay | Admitting: Emergency Medicine

## 2011-09-21 DIAGNOSIS — K5730 Diverticulosis of large intestine without perforation or abscess without bleeding: Secondary | ICD-10-CM | POA: Diagnosis not present

## 2011-09-21 DIAGNOSIS — N289 Disorder of kidney and ureter, unspecified: Secondary | ICD-10-CM | POA: Diagnosis not present

## 2011-09-21 DIAGNOSIS — R6889 Other general symptoms and signs: Secondary | ICD-10-CM | POA: Diagnosis not present

## 2011-09-21 DIAGNOSIS — R110 Nausea: Secondary | ICD-10-CM | POA: Insufficient documentation

## 2011-09-21 DIAGNOSIS — I10 Essential (primary) hypertension: Secondary | ICD-10-CM | POA: Diagnosis not present

## 2011-09-21 DIAGNOSIS — R109 Unspecified abdominal pain: Secondary | ICD-10-CM | POA: Insufficient documentation

## 2011-09-21 DIAGNOSIS — Z79899 Other long term (current) drug therapy: Secondary | ICD-10-CM | POA: Diagnosis not present

## 2011-09-21 LAB — CBC WITH DIFFERENTIAL/PLATELET
Basophils Absolute: 0 10*3/uL (ref 0.0–0.1)
Eosinophils Relative: 2 % (ref 0–5)
Lymphocytes Relative: 39 % (ref 12–46)
Neutro Abs: 2.6 10*3/uL (ref 1.7–7.7)
Platelets: 230 10*3/uL (ref 150–400)
RDW: 12.9 % (ref 11.5–15.5)
WBC: 4.9 10*3/uL (ref 4.0–10.5)

## 2011-09-21 LAB — OCCULT BLOOD, POC DEVICE: Fecal Occult Bld: NEGATIVE

## 2011-09-21 LAB — COMPREHENSIVE METABOLIC PANEL
ALT: 22 U/L (ref 0–35)
AST: 22 U/L (ref 0–37)
CO2: 25 mEq/L (ref 19–32)
Calcium: 10.3 mg/dL (ref 8.4–10.5)
Chloride: 98 mEq/L (ref 96–112)
GFR calc non Af Amer: 86 mL/min — ABNORMAL LOW (ref >90)
Sodium: 139 mEq/L (ref 135–145)

## 2011-09-21 LAB — URINALYSIS, ROUTINE W REFLEX MICROSCOPIC
Leukocytes, UA: NEGATIVE
Nitrite: NEGATIVE
Protein, ur: NEGATIVE mg/dL
Urobilinogen, UA: 0.2 mg/dL (ref 0.0–1.0)

## 2011-09-21 MED ORDER — POTASSIUM CHLORIDE 20 MEQ/15ML (10%) PO LIQD
40.0000 meq | Freq: Once | ORAL | Status: AC
  Administered 2011-09-21: 40 meq via ORAL
  Filled 2011-09-21: qty 30

## 2011-09-21 MED ORDER — POTASSIUM CHLORIDE CRYS ER 20 MEQ PO TBCR: 40.0000 meq | EXTENDED_RELEASE_TABLET | Freq: Once | ORAL | Status: DC

## 2011-09-21 MED ORDER — HYDROCODONE-ACETAMINOPHEN 5-500 MG PO TABS: 1.0000 | ORAL_TABLET | Freq: Four times a day (QID) | ORAL | Status: AC | PRN

## 2011-09-21 MED ORDER — POTASSIUM CHLORIDE 20 MEQ/15ML (10%) PO LIQD: 40.0000 meq | Freq: Once | ORAL | Status: DC

## 2011-09-21 NOTE — ED Notes (Signed)
Pt d/c home in NAD. Pt voiced understanding of d/c instructions and follow up care. Pt instructed not to drive after taking norco. Pt able to ambulate with quick, steady gait. Pt denies any pain at this time.

## 2011-09-21 NOTE — ED Provider Notes (Signed)
78 yo woman with episodes of lower abdominal pain starting early in the morning, worsened if she has a stool, and subsiding during the morning, only to recur the next night.  She has seen Carman Ching, M.D., her GI physician, about this, without a diagnosis.  Exam shows pain localization to the suprapubic region.  There is no mass or tenderness there.  Lab work including abdominal/pelvic CT negative.  I spoke to Dr. Bosie Clos, covering for Dr. Randa Evens, and their office will call pt tomorrow morning with a time to be seen.  Carleene Cooper III, MD 09/21/11 [phone]

## 2011-09-21 NOTE — ED Notes (Signed)
D/W PA Dahlia Client- Wants to proceed with CT abd/pel with PO contrast (Barium) only due to hx of allergy Given timeline and hx, unknown agent for IVP- Could have been non-ionic at that time (Patient would have been 62) and non-ionic was readily available

## 2011-09-21 NOTE — ED Notes (Signed)
Patient is resting,  She has completed her oral contrast.  She is scheduled for approx 1020 per CT.  Spoke with patient regarding potassium.  She is unable to take the pills.  Will provide in liquid form.  Ok to administer per ct

## 2011-09-21 NOTE — ED Notes (Signed)
Patient complains of lower left quad abdominal pain worse after BM. Pain radiates into the left upper leg causing with numbness. Denies pain on the right side. Last BM this morning. Normal BM, Patient advises that she has had this problem intermittently over the last two months. Abdomen soft with positive bowel sounds.

## 2011-09-21 NOTE — ED Provider Notes (Signed)
History     CSN: [id number]  Arrival date & time 09/21/11  0727   First MD Initiated Contact with Patient 09/21/11 [phone]      Chief Complaint  Patient presents with  . Abdominal Pain    (Consider location/radiation/quality/duration/timing/severity/associated sxs/prior treatment) HPI Comments: SHATERA RENNERT 78 y.o. female   The chief complaint is: Patient presents with:   Abdominal Pain   The patient has medical history significant for:   Past Medical History:   Hypertension                                                The onset of the symptoms was  abrupt starting 3 months ago,  The Course is  intermittent, gradually worsened defecation makes symptoms worse, nothing makes symptoms better Has associated nausea Denies  fever, chills, diaphoresis, headache, chest pain, shortness of breath, vomiting, diarrhea, constipation, back pain, fatigue, syncope or near syncope, dizziness, lightheadedness or blood in her stool.      Patient is a 78 y.o. female presenting with abdominal pain. The history is provided by the patient, the spouse and medical records. History Limited By: none.  Abdominal Pain The primary symptoms of the illness include abdominal pain and nausea. The primary symptoms of the illness do not include fever, fatigue, shortness of breath, vomiting, diarrhea or dysuria.  Symptoms associated with the illness do not include diaphoresis, constipation, urgency, hematuria, frequency or back pain.    KATLYNNE MCKERCHER is a 78 y.o. female who presents to the emergency department complaining of intermittent abdominal pain x3 months. She states the pain begins every night around 2 AM and persist for 6-8 hours the resolves spontaneously. It is described as cramping and pressure, it does not radiate, it is rated a 10 out of 10. The pain is located in the lower abdomen and pelvis region.  The pain is worse on the left. She has tried Metamucil without relief. She states when the  pain begins she feels the need to defecate. When she does so it is often small and the pain intensifies afterwards. She has seen Dr. Randa Evens at [facility] about 3 months ago and he prescribed the Metamucil. She had colonoscopy 3 years ago which was negative. She has associated nausea with the pain and some mild anorexia. She denies fever, chills, diaphoresis, headache, chest pain, shortness of breath, vomiting, diarrhea, constipation, back pain, fatigue, syncope or near syncope, dizziness, lightheadedness or blood in her stool.  Past Medical History  Diagnosis Date  . Hypertension     History reviewed. No pertinent past surgical history.  History reviewed. No pertinent family history.  History  Substance Use Topics  . Smoking status: Not on file  . Smokeless tobacco: Not on file  . Alcohol Use: Not on file    OB History    Grav Para Term Preterm Abortions TAB SAB Ect Mult Living                  Review of Systems  Constitutional: Positive for appetite change (decrease). Negative for fever, diaphoresis, fatigue and unexpected weight change.  HENT: Negative for mouth sores, trouble swallowing, neck pain and neck stiffness.   Respiratory: Negative for cough, chest tightness, shortness of breath, wheezing and stridor.   Cardiovascular: Negative for chest pain and palpitations.  Gastrointestinal: Positive for nausea and abdominal  pain. Negative for vomiting, diarrhea, constipation, blood in stool, abdominal distention and rectal pain.  Genitourinary: Negative for dysuria, urgency, frequency, hematuria, flank pain and difficulty urinating.  Musculoskeletal: Negative for back pain.  Skin: Negative for rash.  Neurological: Negative for dizziness, syncope, weakness, light-headedness, numbness and headaches.  Hematological: Negative for adenopathy.  Psychiatric/Behavioral: Negative for confusion.  All other systems reviewed and are negative.    Allergies  Demerol; Iohexol;  Suprax; and Penicillins  Home Medications   Current Outpatient Rx  Name Route Sig Dispense Refill  . AMLODIPINE BESYLATE 5 MG PO TABS Oral Take 5 mg by mouth daily.    . ASPIRIN EC 81 MG PO TBEC Oral Take 81 mg by mouth daily.    Marland Kitchen CALCIUM 600 PO Oral Take 1 tablet by mouth 2 (two) times daily.    Marland Kitchen COENZYME Q-10 100 MG PO CAPS Oral Take 100 mg by mouth daily.    Marland Kitchen HYDROCHLOROTHIAZIDE 25 MG PO TABS Oral Take 25 mg by mouth daily.    . ADULT MULTIVITAMIN W/MINERALS CH Oral Take 1 tablet by mouth daily.    . OMEGA-3-ACID ETHYL ESTERS 1 G PO CAPS Oral Take 1 g by mouth daily.    Marland Kitchen OMEPRAZOLE MAGNESIUM 20 MG PO TBEC Oral Take 20 mg by mouth daily.    . QUINAPRIL HCL 40 MG PO TABS Oral Take 40 mg by mouth at bedtime.    Marland Kitchen ROSUVASTATIN CALCIUM 5 MG PO TABS Oral Take 5 mg by mouth daily.    Marland Kitchen HYDROCODONE-ACETAMINOPHEN 5-500 MG PO TABS Oral Take 1 tablet by mouth every 6 (six) hours as needed for pain. 15 tablet 0    BP 138/76  Pulse 74  Temp 97.7 F (36.5 C) (Oral)  Resp 18  SpO2 95%  Physical Exam  Nursing note and vitals reviewed. Constitutional: She is oriented to person, place, and time. She appears well-developed and well-nourished.  HENT:  Head: Normocephalic and atraumatic.  Mouth/Throat: Oropharynx is clear and moist.  Eyes: Conjunctivae are normal. No scleral icterus.  Neck: Neck supple.  Cardiovascular: Normal rate, regular rhythm, normal heart sounds and intact distal pulses.  Exam reveals no gallop and no friction rub.   No murmur heard. Pulmonary/Chest: Effort normal and breath sounds normal. No respiratory distress. She has no wheezes. She exhibits no tenderness.  Abdominal: Soft. Normal appearance and bowel sounds are normal. She exhibits no distension, no ascites and no mass. There is no hepatosplenomegaly. There is tenderness (very mild) in the right lower quadrant, suprapubic area and left lower quadrant. There is no rigidity, no rebound, no guarding, no CVA  tenderness, no tenderness at McBurney's point and negative Murphy's sign.  Musculoskeletal: Normal range of motion.  Lymphadenopathy:    She has no cervical adenopathy.  Neurological: She is alert and oriented to person, place, and time. She exhibits normal muscle tone. Coordination normal.  Skin: Skin is warm and dry. No rash noted.  Psychiatric: She has a normal mood and affect. Her behavior is normal. Judgment and thought content normal.    ED Course  Procedures (including critical care time)  Labs Reviewed  CBC WITH DIFFERENTIAL - Abnormal; Notable for the following:    RBC 5.15 (*)     All other components within normal limits  COMPREHENSIVE METABOLIC PANEL - Abnormal; Notable for the following:    Potassium 3.3 (*)     GFR calc non Af Amer 86 (*)     All other components within normal limits  URINALYSIS, ROUTINE W REFLEX MICROSCOPIC  OCCULT BLOOD, POC DEVICE   Ct Abdomen Pelvis Wo Contrast  09/21/2011  *RADIOLOGY REPORT*  Clinical Data: Abdominal pain after defecation.  CT ABDOMEN AND PELVIS WITHOUT CONTRAST  Technique:  Multidetector CT imaging of the abdomen and pelvis was performed following the standard protocol without intravenous contrast.  Comparison: CT of the abdomen and pelvis 03/25/2011.  Findings:  Lung Bases: Small hiatal hernia.  Abdomen/Pelvis:  The unenhanced appearance of the liver, gallbladder, pancreas, spleen, bilateral adrenal glands and the right kidney is unremarkable.  There is a 5.8 cm low attenuation lesion in the left kidney that is similar to the prior study, likely to represent a large cyst (technically not characterize on today's noncontrast examination).  Extensive atherosclerosis in the abdominal and pelvic vasculature, without definite aneurysm.  There are a few colonic diverticula, without surrounding inflammatory changes to suggest acute diverticulitis.  No ascites or pneumoperitoneum and no pathologic distension of bowel.  No definite pathologic  lymphadenopathy identified within the abdomen or pelvis.  The uterus and ovaries are atrophic, but otherwise unremarkable in appearance.  Urinary bladder is normal in appearance.  Musculoskeletal: There are no aggressive appearing lytic or blastic lesions noted in the visualized portions of the skeleton.  IMPRESSION: 1.  No acute findings in the abdomen or pelvis to account for the patient's symptoms. 2.  There is very mild colonic diverticulosis, without findings to suggest acute diverticulitis at this time. 3.  Extensive atherosclerosis. 4.  5.8 cm low attenuation lesion in the left kidney likely represents a large cyst. 5.  Small hiatal hernia.  Original Report Authenticated By: Florencia Reasons, M.D.    Results for orders placed during the hospital encounter of 09/21/11  URINALYSIS, ROUTINE W REFLEX MICROSCOPIC      Component Value Range   Color, Urine YELLOW  YELLOW   APPearance CLEAR  CLEAR   Specific Gravity, Urine 1.010  1.005 - 1.030   pH 7.0  5.0 - 8.0   Glucose, UA NEGATIVE  NEGATIVE mg/dL   Hgb urine dipstick NEGATIVE  NEGATIVE   Bilirubin Urine NEGATIVE  NEGATIVE   Ketones, ur NEGATIVE  NEGATIVE mg/dL   Protein, ur NEGATIVE  NEGATIVE mg/dL   Urobilinogen, UA 0.2  0.0 - 1.0 mg/dL   Nitrite NEGATIVE  NEGATIVE   Leukocytes, UA NEGATIVE  NEGATIVE  CBC WITH DIFFERENTIAL      Component Value Range   WBC 4.9  4.0 - 10.5 K/uL   RBC 5.15 (*) 3.87 - 5.11 MIL/uL   Hemoglobin 14.9  12.0 - 15.0 g/dL   HCT 96.0  45.4 - 09.8 %   MCV 84.1  78.0 - 100.0 fL   MCH 28.9  26.0 - 34.0 pg   MCHC 34.4  30.0 - 36.0 g/dL   RDW 11.9  14.7 - 82.9 %   Platelets 230  150 - 400 K/uL   Neutrophils Relative 52  43 - 77 %   Neutro Abs 2.6  1.7 - 7.7 K/uL   Lymphocytes Relative 39  12 - 46 %   Lymphs Abs 1.9  0.7 - 4.0 K/uL   Monocytes Relative 6  3 - 12 %   Monocytes Absolute 0.3  0.1 - 1.0 K/uL   Eosinophils Relative 2  0 - 5 %   Eosinophils Absolute 0.1  0.0 - 0.7 K/uL   Basophils Relative 1  0 -  1 %   Basophils Absolute 0.0  0.0 - 0.1 K/uL  COMPREHENSIVE  METABOLIC PANEL      Component Value Range   Sodium 139  135 - 145 mEq/L   Potassium 3.3 (*) 3.5 - 5.1 mEq/L   Chloride 98  96 - 112 mEq/L   CO2 25  19 - 32 mEq/L   Glucose, Bld 98  70 - 99 mg/dL   BUN 8  6 - 23 mg/dL   Creatinine, Ser 4.09  0.50 - 1.10 mg/dL   Calcium 81.1  8.4 - 91.4 mg/dL   Total Protein 8.3  6.0 - 8.3 g/dL   Albumin 4.6  3.5 - 5.2 g/dL   AST 22  0 - 37 U/L   ALT 22  0 - 35 U/L   Alkaline Phosphatase 112  39 - 117 U/L   Total Bilirubin 0.5  0.3 - 1.2 mg/dL   GFR calc non Af Amer 86 (*) >90 mL/min   GFR calc Af Amer >90  >90 mL/min  OCCULT BLOOD, POC DEVICE      Component Value Range   Fecal Occult Bld NEGATIVE     Ct Abdomen Pelvis Wo Contrast  09/21/2011  *RADIOLOGY REPORT*  Clinical Data: Abdominal pain after defecation.  CT ABDOMEN AND PELVIS WITHOUT CONTRAST  Technique:  Multidetector CT imaging of the abdomen and pelvis was performed following the standard protocol without intravenous contrast.  Comparison: CT of the abdomen and pelvis 03/25/2011.  Findings:  Lung Bases: Small hiatal hernia.  Abdomen/Pelvis:  The unenhanced appearance of the liver, gallbladder, pancreas, spleen, bilateral adrenal glands and the right kidney is unremarkable.  There is a 5.8 cm low attenuation lesion in the left kidney that is similar to the prior study, likely to represent a large cyst (technically not characterize on today's noncontrast examination).  Extensive atherosclerosis in the abdominal and pelvic vasculature, without definite aneurysm.  There are a few colonic diverticula, without surrounding inflammatory changes to suggest acute diverticulitis.  No ascites or pneumoperitoneum and no pathologic distension of bowel.  No definite pathologic lymphadenopathy identified within the abdomen or pelvis.  The uterus and ovaries are atrophic, but otherwise unremarkable in appearance.  Urinary bladder is normal in appearance.   Musculoskeletal: There are no aggressive appearing lytic or blastic lesions noted in the visualized portions of the skeleton.  IMPRESSION: 1.  No acute findings in the abdomen or pelvis to account for the patient's symptoms. 2.  There is very mild colonic diverticulosis, without findings to suggest acute diverticulitis at this time. 3.  Extensive atherosclerosis. 4.  5.8 cm low attenuation lesion in the left kidney likely represents a large cyst. 5.  Small hiatal hernia.  Original Report Authenticated By: Florencia Reasons, M.D.     1. Abdominal pain of unknown etiology       MDM  Kirkland Hun presents complaining of intermittent abdominal pain x3 months. Her abdomen is soft and only mildly tender she's had no syncopal episode and aortic aneurysm is low on my differential.  She is no history of abdominal surgery therefore a bowel obstruction is unlikely.  Mesenteric ischemia is a consideration, but timing of the symptoms is unusual as she does not have any difficulty Will evaluate for UTI, diverticulitis, other acute abdominal.  Labs are largely normal with no evidence of anemia making a GI bleed less likely.  Potassium resulted at 3.3 and was replaced orally.  CT is without acute findings to account for the patient's symptoms.  I have discussed the patient with Dr Ignacia Palma and a consult was called  to [facility].  They would like to see her at the office tomorrow morning and will call the patient in the morning with an appointment time.  I will give PO pain control until she can be seen by them.    1. Medications: Vicodin, usual home medications 2. Treatment: rest, hydration, take the pain medication as needed 3. Follow Up: with [facility] in the morning.  They will call with an appointment time for tomorrow.  If you have not heard from them by 10am, please call them.             Dahlia Client Markea Ruzich, PA-C 09/21/11 1239

## 2011-09-21 NOTE — ED Notes (Signed)
Pt ambulated to restroom independently with quick steady gait and in NAD.

## 2011-09-21 NOTE — ED Notes (Signed)
Patient now feels slight nausea, no vomiting

## 2011-09-23 DIAGNOSIS — R1084 Generalized abdominal pain: Secondary | ICD-10-CM | POA: Diagnosis not present

## 2011-10-09 DIAGNOSIS — R109 Unspecified abdominal pain: Secondary | ICD-10-CM | POA: Diagnosis not present

## 2011-10-09 DIAGNOSIS — F411 Generalized anxiety disorder: Secondary | ICD-10-CM | POA: Diagnosis not present

## 2011-10-14 DIAGNOSIS — R109 Unspecified abdominal pain: Secondary | ICD-10-CM | POA: Diagnosis not present

## 2011-10-21 DIAGNOSIS — I10 Essential (primary) hypertension: Secondary | ICD-10-CM | POA: Diagnosis not present

## 2011-10-21 DIAGNOSIS — R109 Unspecified abdominal pain: Secondary | ICD-10-CM | POA: Diagnosis not present

## 2011-11-19 DIAGNOSIS — M810 Age-related osteoporosis without current pathological fracture: Secondary | ICD-10-CM | POA: Diagnosis not present

## 2011-11-19 DIAGNOSIS — Z79899 Other long term (current) drug therapy: Secondary | ICD-10-CM | POA: Diagnosis not present

## 2011-11-19 DIAGNOSIS — I10 Essential (primary) hypertension: Secondary | ICD-10-CM | POA: Diagnosis not present

## 2011-11-19 DIAGNOSIS — Z1331 Encounter for screening for depression: Secondary | ICD-10-CM | POA: Diagnosis not present

## 2011-11-19 DIAGNOSIS — R109 Unspecified abdominal pain: Secondary | ICD-10-CM | POA: Diagnosis not present

## 2011-11-19 DIAGNOSIS — Z0000 Encounter for general adult medical examination without abnormal findings: Secondary | ICD-10-CM | POA: Diagnosis not present

## 2011-11-19 DIAGNOSIS — Z23 Encounter for immunization: Secondary | ICD-10-CM | POA: Diagnosis not present

## 2011-11-19 DIAGNOSIS — E7800 Pure hypercholesterolemia, unspecified: Secondary | ICD-10-CM | POA: Diagnosis not present

## 2011-12-03 DIAGNOSIS — H04129 Dry eye syndrome of unspecified lacrimal gland: Secondary | ICD-10-CM | POA: Diagnosis not present

## 2011-12-03 DIAGNOSIS — H40019 Open angle with borderline findings, low risk, unspecified eye: Secondary | ICD-10-CM | POA: Diagnosis not present

## 2011-12-11 DIAGNOSIS — Z1231 Encounter for screening mammogram for malignant neoplasm of breast: Secondary | ICD-10-CM | POA: Diagnosis not present

## 2011-12-18 DIAGNOSIS — R300 Dysuria: Secondary | ICD-10-CM | POA: Diagnosis not present

## 2011-12-23 DIAGNOSIS — R109 Unspecified abdominal pain: Secondary | ICD-10-CM | POA: Diagnosis not present

## 2012-03-02 DIAGNOSIS — I10 Essential (primary) hypertension: Secondary | ICD-10-CM | POA: Diagnosis not present

## 2012-03-02 DIAGNOSIS — R1084 Generalized abdominal pain: Secondary | ICD-10-CM | POA: Diagnosis not present

## 2012-04-09 ENCOUNTER — Other Ambulatory Visit: Payer: Self-pay | Admitting: Dermatology

## 2012-04-09 DIAGNOSIS — D485 Neoplasm of uncertain behavior of skin: Secondary | ICD-10-CM | POA: Diagnosis not present

## 2012-04-09 DIAGNOSIS — C44621 Squamous cell carcinoma of skin of unspecified upper limb, including shoulder: Secondary | ICD-10-CM | POA: Diagnosis not present

## 2012-04-12 DIAGNOSIS — J329 Chronic sinusitis, unspecified: Secondary | ICD-10-CM | POA: Diagnosis not present

## 2012-04-12 DIAGNOSIS — J3489 Other specified disorders of nose and nasal sinuses: Secondary | ICD-10-CM | POA: Diagnosis not present

## 2012-04-12 DIAGNOSIS — R05 Cough: Secondary | ICD-10-CM | POA: Diagnosis not present

## 2012-04-12 DIAGNOSIS — R059 Cough, unspecified: Secondary | ICD-10-CM | POA: Diagnosis not present

## 2012-06-29 DIAGNOSIS — Z79899 Other long term (current) drug therapy: Secondary | ICD-10-CM | POA: Diagnosis not present

## 2012-06-29 DIAGNOSIS — I10 Essential (primary) hypertension: Secondary | ICD-10-CM | POA: Diagnosis not present

## 2012-06-29 DIAGNOSIS — R21 Rash and other nonspecific skin eruption: Secondary | ICD-10-CM | POA: Diagnosis not present

## 2012-07-01 ENCOUNTER — Other Ambulatory Visit: Payer: Self-pay | Admitting: Dermatology

## 2012-07-01 DIAGNOSIS — C44621 Squamous cell carcinoma of skin of unspecified upper limb, including shoulder: Secondary | ICD-10-CM | POA: Diagnosis not present

## 2012-07-01 DIAGNOSIS — Z85828 Personal history of other malignant neoplasm of skin: Secondary | ICD-10-CM | POA: Diagnosis not present

## 2012-07-01 DIAGNOSIS — D0460 Carcinoma in situ of skin of unspecified upper limb, including shoulder: Secondary | ICD-10-CM | POA: Diagnosis not present

## 2012-07-01 DIAGNOSIS — D485 Neoplasm of uncertain behavior of skin: Secondary | ICD-10-CM | POA: Diagnosis not present

## 2012-07-05 DIAGNOSIS — Z961 Presence of intraocular lens: Secondary | ICD-10-CM | POA: Diagnosis not present

## 2012-07-05 DIAGNOSIS — H18519 Endothelial corneal dystrophy, unspecified eye: Secondary | ICD-10-CM | POA: Diagnosis not present

## 2012-07-05 DIAGNOSIS — H40019 Open angle with borderline findings, low risk, unspecified eye: Secondary | ICD-10-CM | POA: Diagnosis not present

## 2012-07-05 DIAGNOSIS — H353190 Nonexudative age-related macular degeneration, unspecified eye, stage unspecified: Secondary | ICD-10-CM | POA: Diagnosis not present

## 2012-07-05 DIAGNOSIS — H26499 Other secondary cataract, unspecified eye: Secondary | ICD-10-CM | POA: Diagnosis not present

## 2012-07-05 DIAGNOSIS — H04129 Dry eye syndrome of unspecified lacrimal gland: Secondary | ICD-10-CM | POA: Diagnosis not present

## 2012-07-13 DIAGNOSIS — Z1211 Encounter for screening for malignant neoplasm of colon: Secondary | ICD-10-CM | POA: Diagnosis not present

## 2012-07-16 DIAGNOSIS — L570 Actinic keratosis: Secondary | ICD-10-CM | POA: Diagnosis not present

## 2012-07-16 DIAGNOSIS — Z85828 Personal history of other malignant neoplasm of skin: Secondary | ICD-10-CM | POA: Diagnosis not present

## 2012-07-19 DIAGNOSIS — Z961 Presence of intraocular lens: Secondary | ICD-10-CM | POA: Diagnosis not present

## 2012-07-19 DIAGNOSIS — H35359 Cystoid macular degeneration, unspecified eye: Secondary | ICD-10-CM | POA: Diagnosis not present

## 2012-10-27 DIAGNOSIS — D692 Other nonthrombocytopenic purpura: Secondary | ICD-10-CM | POA: Diagnosis not present

## 2012-10-27 DIAGNOSIS — Z85828 Personal history of other malignant neoplasm of skin: Secondary | ICD-10-CM | POA: Diagnosis not present

## 2012-10-27 DIAGNOSIS — L820 Inflamed seborrheic keratosis: Secondary | ICD-10-CM | POA: Diagnosis not present

## 2012-10-27 DIAGNOSIS — L570 Actinic keratosis: Secondary | ICD-10-CM | POA: Diagnosis not present

## 2012-11-29 DIAGNOSIS — H01009 Unspecified blepharitis unspecified eye, unspecified eyelid: Secondary | ICD-10-CM | POA: Diagnosis not present

## 2012-11-29 DIAGNOSIS — H04129 Dry eye syndrome of unspecified lacrimal gland: Secondary | ICD-10-CM | POA: Diagnosis not present

## 2012-11-29 DIAGNOSIS — H40019 Open angle with borderline findings, low risk, unspecified eye: Secondary | ICD-10-CM | POA: Diagnosis not present

## 2012-11-30 DIAGNOSIS — I10 Essential (primary) hypertension: Secondary | ICD-10-CM | POA: Diagnosis not present

## 2012-11-30 DIAGNOSIS — Z1331 Encounter for screening for depression: Secondary | ICD-10-CM | POA: Diagnosis not present

## 2012-11-30 DIAGNOSIS — Z0000 Encounter for general adult medical examination without abnormal findings: Secondary | ICD-10-CM | POA: Diagnosis not present

## 2012-11-30 DIAGNOSIS — E7800 Pure hypercholesterolemia, unspecified: Secondary | ICD-10-CM | POA: Diagnosis not present

## 2012-11-30 DIAGNOSIS — G479 Sleep disorder, unspecified: Secondary | ICD-10-CM | POA: Diagnosis not present

## 2012-11-30 DIAGNOSIS — Z79899 Other long term (current) drug therapy: Secondary | ICD-10-CM | POA: Diagnosis not present

## 2012-12-14 DIAGNOSIS — M810 Age-related osteoporosis without current pathological fracture: Secondary | ICD-10-CM | POA: Diagnosis not present

## 2012-12-14 DIAGNOSIS — Z1231 Encounter for screening mammogram for malignant neoplasm of breast: Secondary | ICD-10-CM | POA: Diagnosis not present

## 2013-01-10 DIAGNOSIS — M810 Age-related osteoporosis without current pathological fracture: Secondary | ICD-10-CM | POA: Diagnosis not present

## 2013-01-10 DIAGNOSIS — I10 Essential (primary) hypertension: Secondary | ICD-10-CM | POA: Diagnosis not present

## 2013-01-10 DIAGNOSIS — M546 Pain in thoracic spine: Secondary | ICD-10-CM | POA: Diagnosis not present

## 2013-02-16 ENCOUNTER — Other Ambulatory Visit: Payer: Self-pay | Admitting: Dermatology

## 2013-02-16 DIAGNOSIS — C44721 Squamous cell carcinoma of skin of unspecified lower limb, including hip: Secondary | ICD-10-CM | POA: Diagnosis not present

## 2013-02-16 DIAGNOSIS — Z85828 Personal history of other malignant neoplasm of skin: Secondary | ICD-10-CM | POA: Diagnosis not present

## 2013-02-16 DIAGNOSIS — L570 Actinic keratosis: Secondary | ICD-10-CM | POA: Diagnosis not present

## 2013-02-16 DIAGNOSIS — D485 Neoplasm of uncertain behavior of skin: Secondary | ICD-10-CM | POA: Diagnosis not present

## 2013-06-07 DIAGNOSIS — I10 Essential (primary) hypertension: Secondary | ICD-10-CM | POA: Diagnosis not present

## 2013-06-07 DIAGNOSIS — Z79899 Other long term (current) drug therapy: Secondary | ICD-10-CM | POA: Diagnosis not present

## 2013-06-07 DIAGNOSIS — E7800 Pure hypercholesterolemia, unspecified: Secondary | ICD-10-CM | POA: Diagnosis not present

## 2013-06-07 DIAGNOSIS — E782 Mixed hyperlipidemia: Secondary | ICD-10-CM | POA: Diagnosis not present

## 2013-07-04 DIAGNOSIS — J309 Allergic rhinitis, unspecified: Secondary | ICD-10-CM | POA: Diagnosis not present

## 2013-07-28 DIAGNOSIS — H40019 Open angle with borderline findings, low risk, unspecified eye: Secondary | ICD-10-CM | POA: Diagnosis not present

## 2013-07-28 DIAGNOSIS — H04129 Dry eye syndrome of unspecified lacrimal gland: Secondary | ICD-10-CM | POA: Diagnosis not present

## 2013-07-28 DIAGNOSIS — Z961 Presence of intraocular lens: Secondary | ICD-10-CM | POA: Diagnosis not present

## 2013-07-28 DIAGNOSIS — H353190 Nonexudative age-related macular degeneration, unspecified eye, stage unspecified: Secondary | ICD-10-CM | POA: Diagnosis not present

## 2013-10-04 DIAGNOSIS — R109 Unspecified abdominal pain: Secondary | ICD-10-CM | POA: Diagnosis not present

## 2013-10-07 DIAGNOSIS — K5732 Diverticulitis of large intestine without perforation or abscess without bleeding: Secondary | ICD-10-CM | POA: Diagnosis not present

## 2013-10-17 ENCOUNTER — Encounter: Payer: Self-pay | Admitting: Internal Medicine

## 2013-10-18 DIAGNOSIS — K5900 Constipation, unspecified: Secondary | ICD-10-CM | POA: Diagnosis not present

## 2013-10-18 DIAGNOSIS — K5732 Diverticulitis of large intestine without perforation or abscess without bleeding: Secondary | ICD-10-CM | POA: Diagnosis not present

## 2013-10-18 DIAGNOSIS — I10 Essential (primary) hypertension: Secondary | ICD-10-CM | POA: Diagnosis not present

## 2013-10-31 DIAGNOSIS — H40019 Open angle with borderline findings, low risk, unspecified eye: Secondary | ICD-10-CM | POA: Diagnosis not present

## 2013-10-31 DIAGNOSIS — H353190 Nonexudative age-related macular degeneration, unspecified eye, stage unspecified: Secondary | ICD-10-CM | POA: Diagnosis not present

## 2013-11-11 DIAGNOSIS — M940 Chondrocostal junction syndrome [Tietze]: Secondary | ICD-10-CM | POA: Diagnosis not present

## 2013-12-02 ENCOUNTER — Other Ambulatory Visit: Payer: Self-pay | Admitting: Dermatology

## 2013-12-02 DIAGNOSIS — L821 Other seborrheic keratosis: Secondary | ICD-10-CM | POA: Diagnosis not present

## 2013-12-02 DIAGNOSIS — D225 Melanocytic nevi of trunk: Secondary | ICD-10-CM | POA: Diagnosis not present

## 2013-12-02 DIAGNOSIS — L570 Actinic keratosis: Secondary | ICD-10-CM | POA: Diagnosis not present

## 2013-12-02 DIAGNOSIS — D485 Neoplasm of uncertain behavior of skin: Secondary | ICD-10-CM | POA: Diagnosis not present

## 2013-12-02 DIAGNOSIS — L814 Other melanin hyperpigmentation: Secondary | ICD-10-CM | POA: Diagnosis not present

## 2013-12-02 DIAGNOSIS — D692 Other nonthrombocytopenic purpura: Secondary | ICD-10-CM | POA: Diagnosis not present

## 2013-12-02 DIAGNOSIS — Z85828 Personal history of other malignant neoplasm of skin: Secondary | ICD-10-CM | POA: Diagnosis not present

## 2013-12-15 ENCOUNTER — Ambulatory Visit: Payer: Medicare Other | Admitting: Internal Medicine

## 2013-12-19 ENCOUNTER — Other Ambulatory Visit: Payer: Self-pay | Admitting: Geriatric Medicine

## 2013-12-19 ENCOUNTER — Ambulatory Visit
Admission: RE | Admit: 2013-12-19 | Discharge: 2013-12-19 | Disposition: A | Discharge status: Home / Self Care | Payer: Medicare Other | Source: Ambulatory Visit | Attending: Geriatric Medicine | Admitting: Geriatric Medicine

## 2013-12-19 DIAGNOSIS — R0781 Pleurodynia: Secondary | ICD-10-CM

## 2013-12-19 DIAGNOSIS — Z0000 Encounter for general adult medical examination without abnormal findings: Secondary | ICD-10-CM | POA: Diagnosis not present

## 2013-12-19 DIAGNOSIS — Z87891 Personal history of nicotine dependence: Secondary | ICD-10-CM | POA: Diagnosis not present

## 2013-12-19 DIAGNOSIS — Z23 Encounter for immunization: Secondary | ICD-10-CM | POA: Diagnosis not present

## 2013-12-19 DIAGNOSIS — Z1389 Encounter for screening for other disorder: Secondary | ICD-10-CM | POA: Diagnosis not present

## 2013-12-19 DIAGNOSIS — I10 Essential (primary) hypertension: Secondary | ICD-10-CM | POA: Diagnosis not present

## 2013-12-19 DIAGNOSIS — J309 Allergic rhinitis, unspecified: Secondary | ICD-10-CM | POA: Diagnosis not present

## 2014-01-25 ENCOUNTER — Other Ambulatory Visit (HOSPITAL_COMMUNITY): Payer: Self-pay | Admitting: Geriatric Medicine

## 2014-01-25 DIAGNOSIS — R0781 Pleurodynia: Secondary | ICD-10-CM | POA: Diagnosis not present

## 2014-02-21 ENCOUNTER — Encounter (HOSPITAL_COMMUNITY)
Admission: RE | Admit: 2014-02-21 | Discharge: 2014-02-21 | Disposition: A | Discharge status: Home / Self Care | Payer: Medicare Other | Source: Ambulatory Visit | Attending: Geriatric Medicine | Admitting: Geriatric Medicine

## 2014-02-21 DIAGNOSIS — R0781 Pleurodynia: Secondary | ICD-10-CM

## 2014-02-21 MED ORDER — TECHNETIUM TC 99M MEDRONATE IV KIT
25.7000 | PACK | Freq: Once | INTRAVENOUS | Status: AC | PRN
Start: 2014-02-21 — End: 2014-02-21
  Administered 2014-02-21: 25.7 via INTRAVENOUS

## 2014-02-28 DIAGNOSIS — J309 Allergic rhinitis, unspecified: Secondary | ICD-10-CM | POA: Diagnosis not present

## 2014-02-28 DIAGNOSIS — R21 Rash and other nonspecific skin eruption: Secondary | ICD-10-CM | POA: Diagnosis not present

## 2014-02-28 DIAGNOSIS — I10 Essential (primary) hypertension: Secondary | ICD-10-CM | POA: Diagnosis not present

## 2014-02-28 DIAGNOSIS — R0789 Other chest pain: Secondary | ICD-10-CM | POA: Diagnosis not present

## 2014-03-17 DIAGNOSIS — N390 Urinary tract infection, site not specified: Secondary | ICD-10-CM | POA: Diagnosis not present

## 2014-03-29 DIAGNOSIS — H3531 Nonexudative age-related macular degeneration: Secondary | ICD-10-CM | POA: Diagnosis not present

## 2014-03-29 DIAGNOSIS — H40013 Open angle with borderline findings, low risk, bilateral: Secondary | ICD-10-CM | POA: Diagnosis not present

## 2014-03-29 DIAGNOSIS — Z961 Presence of intraocular lens: Secondary | ICD-10-CM | POA: Diagnosis not present

## 2014-03-29 DIAGNOSIS — H1851 Endothelial corneal dystrophy: Secondary | ICD-10-CM | POA: Diagnosis not present

## 2014-05-10 DIAGNOSIS — M940 Chondrocostal junction syndrome [Tietze]: Secondary | ICD-10-CM | POA: Diagnosis not present

## 2014-05-10 DIAGNOSIS — R300 Dysuria: Secondary | ICD-10-CM | POA: Diagnosis not present

## 2014-05-10 DIAGNOSIS — J309 Allergic rhinitis, unspecified: Secondary | ICD-10-CM | POA: Diagnosis not present

## 2014-05-25 DIAGNOSIS — N8110 Cystocele, unspecified: Secondary | ICD-10-CM | POA: Diagnosis not present

## 2014-05-25 DIAGNOSIS — N952 Postmenopausal atrophic vaginitis: Secondary | ICD-10-CM | POA: Diagnosis not present

## 2014-06-08 DIAGNOSIS — N814 Uterovaginal prolapse, unspecified: Secondary | ICD-10-CM | POA: Diagnosis not present

## 2014-06-08 DIAGNOSIS — Z124 Encounter for screening for malignant neoplasm of cervix: Secondary | ICD-10-CM | POA: Diagnosis not present

## 2014-06-08 DIAGNOSIS — N952 Postmenopausal atrophic vaginitis: Secondary | ICD-10-CM | POA: Diagnosis not present

## 2014-06-08 DIAGNOSIS — R3915 Urgency of urination: Secondary | ICD-10-CM | POA: Diagnosis not present

## 2014-06-19 DIAGNOSIS — Z79899 Other long term (current) drug therapy: Secondary | ICD-10-CM | POA: Diagnosis not present

## 2014-06-19 DIAGNOSIS — I10 Essential (primary) hypertension: Secondary | ICD-10-CM | POA: Diagnosis not present

## 2014-06-19 DIAGNOSIS — R0789 Other chest pain: Secondary | ICD-10-CM | POA: Diagnosis not present

## 2014-06-27 DIAGNOSIS — N8110 Cystocele, unspecified: Secondary | ICD-10-CM | POA: Diagnosis not present

## 2014-06-27 DIAGNOSIS — N952 Postmenopausal atrophic vaginitis: Secondary | ICD-10-CM | POA: Diagnosis not present

## 2014-07-27 DIAGNOSIS — N952 Postmenopausal atrophic vaginitis: Secondary | ICD-10-CM | POA: Diagnosis not present

## 2014-07-27 DIAGNOSIS — N8110 Cystocele, unspecified: Secondary | ICD-10-CM | POA: Diagnosis not present

## 2014-08-25 DIAGNOSIS — N899 Noninflammatory disorder of vagina, unspecified: Secondary | ICD-10-CM | POA: Diagnosis not present

## 2014-08-25 DIAGNOSIS — K5900 Constipation, unspecified: Secondary | ICD-10-CM | POA: Diagnosis not present

## 2014-08-25 DIAGNOSIS — Z9289 Personal history of other medical treatment: Secondary | ICD-10-CM | POA: Diagnosis not present

## 2014-08-31 DIAGNOSIS — N952 Postmenopausal atrophic vaginitis: Secondary | ICD-10-CM | POA: Diagnosis not present

## 2014-08-31 DIAGNOSIS — N899 Noninflammatory disorder of vagina, unspecified: Secondary | ICD-10-CM | POA: Diagnosis not present

## 2014-08-31 DIAGNOSIS — N8110 Cystocele, unspecified: Secondary | ICD-10-CM | POA: Diagnosis not present

## 2014-09-13 DIAGNOSIS — Z4689 Encounter for fitting and adjustment of other specified devices: Secondary | ICD-10-CM | POA: Diagnosis not present

## 2014-09-13 DIAGNOSIS — N8110 Cystocele, unspecified: Secondary | ICD-10-CM | POA: Diagnosis not present

## 2014-09-13 DIAGNOSIS — N952 Postmenopausal atrophic vaginitis: Secondary | ICD-10-CM | POA: Diagnosis not present

## 2014-10-10 DIAGNOSIS — Z1231 Encounter for screening mammogram for malignant neoplasm of breast: Secondary | ICD-10-CM | POA: Diagnosis not present

## 2014-10-30 DIAGNOSIS — H40013 Open angle with borderline findings, low risk, bilateral: Secondary | ICD-10-CM | POA: Diagnosis not present

## 2014-10-30 DIAGNOSIS — H1851 Endothelial corneal dystrophy: Secondary | ICD-10-CM | POA: Diagnosis not present

## 2014-10-30 DIAGNOSIS — H01003 Unspecified blepharitis right eye, unspecified eyelid: Secondary | ICD-10-CM | POA: Diagnosis not present

## 2014-10-30 DIAGNOSIS — H04123 Dry eye syndrome of bilateral lacrimal glands: Secondary | ICD-10-CM | POA: Diagnosis not present

## 2014-11-24 DIAGNOSIS — I8392 Asymptomatic varicose veins of left lower extremity: Secondary | ICD-10-CM | POA: Diagnosis not present

## 2014-11-24 DIAGNOSIS — D225 Melanocytic nevi of trunk: Secondary | ICD-10-CM | POA: Diagnosis not present

## 2014-11-24 DIAGNOSIS — L720 Epidermal cyst: Secondary | ICD-10-CM | POA: Diagnosis not present

## 2014-11-24 DIAGNOSIS — L570 Actinic keratosis: Secondary | ICD-10-CM | POA: Diagnosis not present

## 2014-11-24 DIAGNOSIS — L821 Other seborrheic keratosis: Secondary | ICD-10-CM | POA: Diagnosis not present

## 2014-11-24 DIAGNOSIS — I8391 Asymptomatic varicose veins of right lower extremity: Secondary | ICD-10-CM | POA: Diagnosis not present

## 2014-11-24 DIAGNOSIS — Z85828 Personal history of other malignant neoplasm of skin: Secondary | ICD-10-CM | POA: Diagnosis not present

## 2014-11-24 DIAGNOSIS — L918 Other hypertrophic disorders of the skin: Secondary | ICD-10-CM | POA: Diagnosis not present

## 2014-11-30 DIAGNOSIS — Z4689 Encounter for fitting and adjustment of other specified devices: Secondary | ICD-10-CM | POA: Diagnosis not present

## 2014-11-30 DIAGNOSIS — N952 Postmenopausal atrophic vaginitis: Secondary | ICD-10-CM | POA: Diagnosis not present

## 2014-11-30 DIAGNOSIS — N8110 Cystocele, unspecified: Secondary | ICD-10-CM | POA: Diagnosis not present

## 2014-12-07 DIAGNOSIS — L821 Other seborrheic keratosis: Secondary | ICD-10-CM | POA: Diagnosis not present

## 2014-12-07 DIAGNOSIS — L570 Actinic keratosis: Secondary | ICD-10-CM | POA: Diagnosis not present

## 2014-12-07 DIAGNOSIS — Z85828 Personal history of other malignant neoplasm of skin: Secondary | ICD-10-CM | POA: Diagnosis not present

## 2014-12-12 DIAGNOSIS — H6980 Other specified disorders of Eustachian tube, unspecified ear: Secondary | ICD-10-CM | POA: Diagnosis not present

## 2014-12-12 DIAGNOSIS — J309 Allergic rhinitis, unspecified: Secondary | ICD-10-CM | POA: Diagnosis not present

## 2015-01-02 ENCOUNTER — Ambulatory Visit
Admission: RE | Admit: 2015-01-02 | Discharge: 2015-01-02 | Disposition: A | Discharge status: Home / Self Care | Payer: Medicare Other | Source: Ambulatory Visit | Attending: Geriatric Medicine | Admitting: Geriatric Medicine

## 2015-01-02 ENCOUNTER — Other Ambulatory Visit: Payer: Self-pay | Admitting: Geriatric Medicine

## 2015-01-02 DIAGNOSIS — M25531 Pain in right wrist: Secondary | ICD-10-CM | POA: Diagnosis not present

## 2015-01-02 DIAGNOSIS — I10 Essential (primary) hypertension: Secondary | ICD-10-CM | POA: Diagnosis not present

## 2015-01-18 DIAGNOSIS — I10 Essential (primary) hypertension: Secondary | ICD-10-CM | POA: Diagnosis not present

## 2015-01-18 DIAGNOSIS — Z0000 Encounter for general adult medical examination without abnormal findings: Secondary | ICD-10-CM | POA: Diagnosis not present

## 2015-01-18 DIAGNOSIS — Z79899 Other long term (current) drug therapy: Secondary | ICD-10-CM | POA: Diagnosis not present

## 2015-01-18 DIAGNOSIS — E7800 Pure hypercholesterolemia, unspecified: Secondary | ICD-10-CM | POA: Diagnosis not present

## 2015-01-18 DIAGNOSIS — Z1389 Encounter for screening for other disorder: Secondary | ICD-10-CM | POA: Diagnosis not present

## 2015-01-19 DIAGNOSIS — S6991XA Unspecified injury of right wrist, hand and finger(s), initial encounter: Secondary | ICD-10-CM | POA: Diagnosis not present

## 2015-01-19 DIAGNOSIS — M654 Radial styloid tenosynovitis [de Quervain]: Secondary | ICD-10-CM | POA: Diagnosis not present

## 2015-02-14 DIAGNOSIS — M549 Dorsalgia, unspecified: Secondary | ICD-10-CM | POA: Diagnosis not present

## 2015-02-14 DIAGNOSIS — N9489 Other specified conditions associated with female genital organs and menstrual cycle: Secondary | ICD-10-CM | POA: Diagnosis not present

## 2015-02-14 DIAGNOSIS — N8110 Cystocele, unspecified: Secondary | ICD-10-CM | POA: Diagnosis not present

## 2015-03-28 DIAGNOSIS — R079 Chest pain, unspecified: Secondary | ICD-10-CM | POA: Insufficient documentation

## 2015-04-05 ENCOUNTER — Encounter: Payer: Medicare Other | Admitting: Interventional Cardiology

## 2015-04-05 DIAGNOSIS — H353113 Nonexudative age-related macular degeneration, right eye, advanced atrophic without subfoveal involvement: Secondary | ICD-10-CM | POA: Diagnosis not present

## 2015-04-05 DIAGNOSIS — H04123 Dry eye syndrome of bilateral lacrimal glands: Secondary | ICD-10-CM | POA: Diagnosis not present

## 2015-04-05 DIAGNOSIS — Z961 Presence of intraocular lens: Secondary | ICD-10-CM | POA: Diagnosis not present

## 2015-04-05 DIAGNOSIS — H40013 Open angle with borderline findings, low risk, bilateral: Secondary | ICD-10-CM | POA: Diagnosis not present

## 2015-04-05 DIAGNOSIS — H353123 Nonexudative age-related macular degeneration, left eye, advanced atrophic without subfoveal involvement: Secondary | ICD-10-CM | POA: Diagnosis not present

## 2015-04-05 DIAGNOSIS — H01003 Unspecified blepharitis right eye, unspecified eyelid: Secondary | ICD-10-CM | POA: Diagnosis not present

## 2015-05-11 DIAGNOSIS — N8110 Cystocele, unspecified: Secondary | ICD-10-CM | POA: Diagnosis not present

## 2015-05-11 DIAGNOSIS — Z4689 Encounter for fitting and adjustment of other specified devices: Secondary | ICD-10-CM | POA: Diagnosis not present

## 2015-05-11 DIAGNOSIS — R300 Dysuria: Secondary | ICD-10-CM | POA: Diagnosis not present

## 2015-05-11 DIAGNOSIS — N952 Postmenopausal atrophic vaginitis: Secondary | ICD-10-CM | POA: Diagnosis not present

## 2015-05-16 DIAGNOSIS — Z79899 Other long term (current) drug therapy: Secondary | ICD-10-CM | POA: Diagnosis not present

## 2015-05-16 DIAGNOSIS — R5383 Other fatigue: Secondary | ICD-10-CM | POA: Diagnosis not present

## 2015-05-16 DIAGNOSIS — I10 Essential (primary) hypertension: Secondary | ICD-10-CM | POA: Diagnosis not present

## 2015-05-22 DIAGNOSIS — E871 Hypo-osmolality and hyponatremia: Secondary | ICD-10-CM | POA: Diagnosis not present

## 2015-05-22 DIAGNOSIS — I10 Essential (primary) hypertension: Secondary | ICD-10-CM | POA: Diagnosis not present

## 2015-05-23 DIAGNOSIS — R300 Dysuria: Secondary | ICD-10-CM | POA: Diagnosis not present

## 2015-05-30 ENCOUNTER — Encounter: Payer: Medicare Other | Admitting: Interventional Cardiology

## 2015-06-06 DIAGNOSIS — I6789 Other cerebrovascular disease: Secondary | ICD-10-CM | POA: Diagnosis not present

## 2015-06-06 DIAGNOSIS — R200 Anesthesia of skin: Secondary | ICD-10-CM | POA: Diagnosis not present

## 2015-06-06 DIAGNOSIS — R202 Paresthesia of skin: Secondary | ICD-10-CM | POA: Diagnosis not present

## 2015-06-07 ENCOUNTER — Encounter (HOSPITAL_COMMUNITY): Payer: Self-pay | Admitting: Adult Health

## 2015-06-07 ENCOUNTER — Emergency Department (HOSPITAL_COMMUNITY)
Admission: EM | Admit: 2015-06-07 | Discharge: 2015-06-07 | Discharge status: Against Medical Advice | Payer: Medicare Other | Attending: Emergency Medicine | Admitting: Emergency Medicine

## 2015-06-07 DIAGNOSIS — R200 Anesthesia of skin: Secondary | ICD-10-CM

## 2015-06-07 DIAGNOSIS — Z79899 Other long term (current) drug therapy: Secondary | ICD-10-CM | POA: Insufficient documentation

## 2015-06-07 DIAGNOSIS — E785 Hyperlipidemia, unspecified: Secondary | ICD-10-CM | POA: Insufficient documentation

## 2015-06-07 DIAGNOSIS — I10 Essential (primary) hypertension: Secondary | ICD-10-CM | POA: Insufficient documentation

## 2015-06-07 DIAGNOSIS — Z7982 Long term (current) use of aspirin: Secondary | ICD-10-CM | POA: Diagnosis not present

## 2015-06-07 DIAGNOSIS — Z880 Allergy status to penicillin: Secondary | ICD-10-CM | POA: Diagnosis not present

## 2015-06-07 DIAGNOSIS — Z8659 Personal history of other mental and behavioral disorders: Secondary | ICD-10-CM | POA: Insufficient documentation

## 2015-06-07 DIAGNOSIS — Z8673 Personal history of transient ischemic attack (TIA), and cerebral infarction without residual deficits: Secondary | ICD-10-CM | POA: Diagnosis not present

## 2015-06-07 LAB — ETHANOL

## 2015-06-07 LAB — COMPREHENSIVE METABOLIC PANEL
ALT: 23 U/L (ref 14–54)
AST: 23 U/L (ref 15–41)
Albumin: 4.2 g/dL (ref 3.5–5.0)
Alkaline Phosphatase: 78 U/L (ref 38–126)
Anion gap: 12 (ref 5–15)
BUN: 11 mg/dL (ref 6–20)
CHLORIDE: 104 mmol/L (ref 101–111)
CO2: 26 mmol/L (ref 22–32)
CREATININE: 0.68 mg/dL (ref 0.44–1.00)
Calcium: 10.2 mg/dL (ref 8.9–10.3)
GFR calc non Af Amer: >60 mL/min (ref >60)
Glucose, Bld: 122 mg/dL — ABNORMAL HIGH (ref 65–99)
POTASSIUM: 4.6 mmol/L (ref 3.5–5.1)
SODIUM: 142 mmol/L (ref 135–145)
Total Bilirubin: 0.4 mg/dL (ref 0.3–1.2)
Total Protein: 7.3 g/dL (ref 6.5–8.1)

## 2015-06-07 LAB — CBC
HCT: 42 % (ref 36.0–46.0)
Hemoglobin: 14.2 g/dL (ref 12.0–15.0)
MCH: 29.2 pg (ref 26.0–34.0)
MCHC: 33.8 g/dL (ref 30.0–36.0)
MCV: 86.2 fL (ref 78.0–100.0)
PLATELETS: 263 10*3/uL (ref 150–400)
RBC: 4.87 MIL/uL (ref 3.87–5.11)
RDW: 13.5 % (ref 11.5–15.5)
WBC: 6.3 10*3/uL (ref 4.0–10.5)

## 2015-06-07 LAB — PROTIME-INR
INR: 0.97 (ref 0.00–1.49)
Prothrombin Time: 13.1 seconds (ref 11.6–15.2)

## 2015-06-07 LAB — URINALYSIS, ROUTINE W REFLEX MICROSCOPIC
Bilirubin Urine: NEGATIVE
GLUCOSE, UA: NEGATIVE mg/dL
HGB URINE DIPSTICK: NEGATIVE
Ketones, ur: NEGATIVE mg/dL
Nitrite: NEGATIVE
PH: 7 (ref 5.0–8.0)
Protein, ur: NEGATIVE mg/dL
Specific Gravity, Urine: 1.005 (ref 1.005–1.030)

## 2015-06-07 LAB — I-STAT TROPONIN, ED: TROPONIN I, POC: 0 ng/mL (ref 0.00–0.08)

## 2015-06-07 LAB — DIFFERENTIAL
BASOS ABS: 0 10*3/uL (ref 0.0–0.1)
BASOS PCT: 0 %
EOS ABS: 0.2 10*3/uL (ref 0.0–0.7)
Eosinophils Relative: 4 %
Lymphocytes Relative: 36 %
Lymphs Abs: 2.3 10*3/uL (ref 0.7–4.0)
MONO ABS: 0.5 10*3/uL (ref 0.1–1.0)
Monocytes Relative: 8 %
NEUTROS ABS: 3.3 10*3/uL (ref 1.7–7.7)
Neutrophils Relative %: 52 %

## 2015-06-07 LAB — URINE MICROSCOPIC-ADD ON

## 2015-06-07 LAB — RAPID URINE DRUG SCREEN, HOSP PERFORMED
AMPHETAMINES: NOT DETECTED
BENZODIAZEPINES: NOT DETECTED
Barbiturates: NOT DETECTED
COCAINE: NOT DETECTED
Opiates: NOT DETECTED
Tetrahydrocannabinol: NOT DETECTED

## 2015-06-07 LAB — I-STAT CHEM 8, ED
BUN: 13 mg/dL (ref 6–20)
CREATININE: 0.6 mg/dL (ref 0.44–1.00)
Calcium, Ion: 1.16 mmol/L (ref 1.13–1.30)
Chloride: 106 mmol/L (ref 101–111)
Glucose, Bld: 114 mg/dL — ABNORMAL HIGH (ref 65–99)
HEMATOCRIT: 46 % (ref 36.0–46.0)
HEMOGLOBIN: 15.6 g/dL — AB (ref 12.0–15.0)
POTASSIUM: 4 mmol/L (ref 3.5–5.1)
Sodium: 141 mmol/L (ref 135–145)
TCO2: 25 mmol/L (ref 0–100)

## 2015-06-07 LAB — APTT: APTT: 28 s (ref 24–37)

## 2015-06-07 NOTE — ED Notes (Signed)
MD at bedside. 

## 2015-06-07 NOTE — ED Notes (Addendum)
Presents with left sided face facial numbness. She states, "THe left side of my face felt a little sore and I thought I was getting a stye this am, I put some heat on it and then checked my BP and was over 200. The left eye feeling never went away. THe left side of my face felt strange one hour ago, but the eye felt funny this AM and never went away. Symptoms are resolved at this time. Alert, oriented and MAE x4.

## 2015-06-07 NOTE — ED Provider Notes (Signed)
CSN: [id number]     Arrival date & time 06/07/15  0000 History   First MD Initiated Contact with Patient 06/07/15 0007     Chief Complaint  Patient presents with  . Numbness     (Consider location/radiation/quality/duration/timing/severity/associated sxs/prior Treatment) HPI  This is an 82 year old female with history of hypertension, hyperlipidemia, hypokalemia, and recent hyponatremia who presents with left-sided facial numbness. Patient states that tonight at approximately 10:30 PM, she noted that her left face felt "funny." The symptoms resolved within minutes. She states that she woke up this morning and felt a soreness in her left eye. She did not note any rash or lesion. She states over the last 2-3 weeks she's had increasing generalized fatigue and weakness. She was evaluated by her primary physician and found to be hyponatremic. Her HCTZ was discontinued and she reports that her sodium improved on recheck. Currently she is asymptomatic. She denies any speech difficulty, vision changes, weakness numbness tingling otherwise.  Past Medical History  Diagnosis Date  . Hypertension   . Hypokalemia   . HTN (hypertension)   . Hyperlipidemia   . Numbness on right side     SECONDARY TO ELECTROLYTES ABNORMALITIES  . Hypomagnesemia   . Abnormal albumin     LOW  . Moderate anxiety     SERVE STRESS   Past Surgical History  Procedure Laterality Date  . Lumbar disc surgery     Family History  Problem Relation Age of Onset  . Other Mother 71  . Cancer - Colon Father 32  . Cancer Sister 7    BREAST   Social History  Substance Use Topics  . Smoking status: None  . Smokeless tobacco: None  . Alcohol Use: None   OB History    No data available     Review of Systems  Constitutional: Negative for fever.  Eyes: Negative for visual disturbance.  Respiratory: Negative for cough and chest tightness.   Cardiovascular: Negative for chest pain.  Gastrointestinal: Negative for  abdominal pain.  Genitourinary: Negative for dysuria.  Neurological: Positive for numbness. Negative for weakness and headaches.  Psychiatric/Behavioral: Negative for confusion.  All other systems reviewed and are negative.     Allergies  Demerol; Iohexol; Suprax; and Penicillins  Home Medications   Prior to Admission medications   Medication Sig Start Date End Date Taking? Authorizing Provider  amLODipine (NORVASC) 5 MG tablet Take 5 mg by mouth daily.   Yes Historical Provider, MD  aspirin EC 81 MG tablet Take 81 mg by mouth daily.   Yes Historical Provider, MD  Calcium Carbonate (CALCIUM 600 PO) Take 1 tablet by mouth 2 (two) times daily.   Yes Historical Provider, MD  Coenzyme Q-10 100 MG capsule Take 100 mg by mouth daily.   Yes Historical Provider, MD  Multiple Vitamin (MULTIVITAMIN WITH MINERALS) TABS Take 1 tablet by mouth daily.   Yes Historical Provider, MD  omega-3 acid ethyl esters (LOVAZA) 1 G capsule Take 1 g by mouth daily.   Yes Historical Provider, MD  omeprazole (PRILOSEC OTC) 20 MG tablet Take 20 mg by mouth daily.   Yes Historical Provider, MD  quinapril (ACCUPRIL) 40 MG tablet Take 40 mg by mouth at bedtime.   Yes Historical Provider, MD  rosuvastatin (CRESTOR) 5 MG tablet Take 5 mg by mouth daily.   Yes Historical Provider, MD   BP 134/53 mmHg  Pulse 74  Temp(Src) 97.7 F (36.5 C) (Oral)  Resp 15  SpO2 99% Physical Exam  Constitutional: She is oriented to person, place, and time. She appears well-developed and well-nourished.  Appears younger than stated age  HENT:  Head: Normocephalic and atraumatic.  Eyes: EOM are normal. Pupils are equal, round, and reactive to light.  Neck: Neck supple.  Cardiovascular: Normal rate, regular rhythm and normal heart sounds.   No murmur heard. Pulmonary/Chest: Effort normal and breath sounds normal. No respiratory distress. She has no wheezes.  Abdominal: Soft. Bowel sounds are normal. There is no tenderness. There is  no rebound.  Musculoskeletal: She exhibits no edema.  Neurological: She is alert and oriented to person, place, and time.  Cranial nerves II through XII intact, 5 out of 5 showing in all 4 extremities, no drift, sensation grossly intact, normal gait  Skin: Skin is warm and dry.  Psychiatric: She has a normal mood and affect.  Nursing note and vitals reviewed.   ED Course  Procedures (including critical care time) Labs Review Labs Reviewed  COMPREHENSIVE METABOLIC PANEL - Abnormal; Notable for the following:    Glucose, Bld 122 (*)    All other components within normal limits  URINALYSIS, ROUTINE W REFLEX MICROSCOPIC (NOT AT [facility]) - Abnormal; Notable for the following:    Leukocytes, UA SMALL (*)    All other components within normal limits  URINE MICROSCOPIC-ADD ON - Abnormal; Notable for the following:    Squamous Epithelial / LPF 0-5 (*)    Bacteria, UA RARE (*)    All other components within normal limits  I-STAT CHEM 8, ED - Abnormal; Notable for the following:    Glucose, Bld 114 (*)    Hemoglobin 15.6 (*)    All other components within normal limits  ETHANOL  PROTIME-INR  APTT  CBC  DIFFERENTIAL  URINE RAPID DRUG SCREEN, HOSP PERFORMED  I-STAT TROPOININ, ED    Imaging Review No results found. I have personally reviewed and evaluated these images and lab results as part of my medical decision-making.   EKG Interpretation   Date/Time:  Thursday June 07 2015 00:55:28 EDT Ventricular Rate:  75 PR Interval:  155 QRS Duration: 84 QT Interval:  395 QTC Calculation: 441 R Axis:   57 Text Interpretation:  Sinus rhythm Probable left atrial enlargement  Confirmed by Kaoru Rezendes  MD, Akeya Ryther ([id number]) on 06/07/2015 12:59:15 AM      MDM   Final diagnoses:  Left facial numbness    Presents with transient left facial numbness. Nontoxic on exam. Neurologically intact. She certainly has risk factors for stroke including hypertension and hyperlipidemia. Initial blood  pressure 182/81. Blood pressure trended downward while in the emergency department. Discussed with patient likely admission for further TIA workup. Initially, patient was agreeable and neurology was consulted. However, patient declined CT scan and "I change my mind." She understands the risk of being discharged without formal TIA workup. Concern for stroke and/or death. Patient states "I am going to die sometime." She appears to have capacity. She continues to be nonfocal. I have encouraged patient to continue her daily aspirin. Follow-up closely with her primary physician in neurology for formal TIA workup. Patient signed out [place].      Merryl Hacker, MD 06/07/15 [phone]

## 2015-06-07 NOTE — Discharge Instructions (Signed)
You were seen today for concerns for left facial numbness. This can sometimes be an indication of a TIA or mini stroke. You declined admission for further workup. Continue your aspirin daily. You need follow-up with her primary physician and neurology for further workup.  Transient Ischemic Attack A transient ischemic attack (TIA) is a "warning stroke" that causes stroke-like symptoms. Unlike a stroke, a TIA does not cause permanent damage to the brain. The symptoms of a TIA can happen very fast and do not last long. It is important to know the symptoms of a TIA and what to do. This can help prevent a major stroke or death. CAUSES  A TIA is caused by a temporary blockage in an artery in the brain or neck (carotid artery). The blockage does not allow the brain to get the blood supply it needs and can cause different symptoms. The blockage can be caused by either:  A blood clot.  Fatty buildup (plaque) in a neck or brain artery. RISK FACTORS  High blood pressure (hypertension).  High cholesterol.  Diabetes mellitus.  Heart disease.  The buildup of plaque in the blood vessels (peripheral artery disease or atherosclerosis).  The buildup of plaque in the blood vessels that provide blood and oxygen to the brain (carotid artery stenosis).  An abnormal heart rhythm (atrial fibrillation).  Obesity.  Using any tobacco products, including cigarettes, chewing tobacco, or electronic cigarettes.  Taking oral contraceptives, especially in combination with using tobacco.  Physical inactivity.  A diet high in fats, salt (sodium), and calories.  Excessive alcohol use.  Use of illegal drugs (especially cocaine and methamphetamine).  Being female.  Being African American.  Being over the age of 85 years.  Family history of stroke.  Previous history of blood clots, stroke, TIA, or heart attack.  Sickle cell disease. SIGNS AND SYMPTOMS  TIA symptoms are the same as a stroke but are  temporary. These symptoms usually develop suddenly, or may be newly present upon waking from sleep:  Sudden weakness or numbness of the face, arm, or leg, especially on one side of the body.  Sudden trouble walking or difficulty moving arms or legs.  Sudden confusion.  Sudden personality changes.  Trouble speaking (aphasia) or understanding.  Difficulty swallowing.  Sudden trouble seeing in one or both eyes.  Double vision.  Dizziness.  Loss of balance or coordination.  Sudden severe headache with no known cause.  Trouble reading or writing.  Loss of bowel or bladder control.  Loss of consciousness. DIAGNOSIS  Your health care provider may be able to determine the presence or absence of a TIA based on your symptoms, history, and physical exam. CT scan of the brain is usually performed to help identify a TIA. Other tests may include:  Electrocardiography (ECG).  Continuous heart monitoring.  Echocardiography.  Carotid ultrasonography.  MRI.  A scan of the brain circulation.  Blood tests. TREATMENT  Since the symptoms of TIA are the same as a stroke, it is important to seek treatment as soon as possible. You may need a medicine to dissolve a blood clot (thrombolytic) if that is the cause of the TIA. This medicine cannot be given if too much time has passed. Treatment may also include:   Rest, oxygen, fluids through an IV tube, and medicines to thin the blood (anticoagulants).  Measures will be taken to prevent short-term and long-term complications, including infection from breathing foreign material into the lungs (aspiration pneumonia), blood clots in the legs, and falls.  Procedures to either remove plaque in the carotid arteries or dilate carotid arteries that have narrowed due to plaque. Those procedures are:  Carotid endarterectomy.  Carotid angioplasty and stenting.  Medicines and diet may be used to address diabetes, high blood pressure, and other  underlying risk factors. HOME CARE INSTRUCTIONS   Take medicines only as directed by your health care provider. Follow the directions carefully. Medicines may be used to control risk factors for a stroke. Be sure you understand all your medicine instructions.  You may be told to take aspirin or the anticoagulant warfarin. Warfarin needs to be taken exactly as instructed.  Taking too much or too little warfarin is dangerous. Too much warfarin increases the risk of bleeding. Too little warfarin continues to allow the risk for blood clots. While taking warfarin, you will need to have regular blood tests to measure your blood clotting time. A PT blood test measures how long it takes for blood to clot. Your PT is used to calculate another value called an INR. Your PT and INR help your health care provider to adjust your dose of warfarin. The dose can change for many reasons. It is critically important that you take warfarin exactly as prescribed.  Many foods, especially foods high in vitamin K can interfere with warfarin and affect the PT and INR. Foods high in vitamin K include spinach, kale, broccoli, cabbage, collard and turnip greens, Brussels sprouts, peas, cauliflower, seaweed, and parsley, as well as beef and pork liver, green tea, and soybean oil. You should eat a consistent amount of foods high in vitamin K. Avoid major changes in your diet, or notify your health care provider before changing your diet. Arrange a visit with a dietitian to answer your questions.  Many medicines can interfere with warfarin and affect the PT and INR. You must tell your health care provider about any and all medicines you take; this includes all vitamins and supplements. Be especially cautious with aspirin and anti-inflammatory medicines. Do not take or discontinue any prescribed or over-the-counter medicine except on the advice of your health care provider or pharmacist.  Warfarin can have side effects, such as  excessive bruising or bleeding. You will need to hold pressure over cuts for longer than usual. Your health care provider or pharmacist will discuss other potential side effects.  Avoid sports or activities that may cause injury or bleeding.  Be careful when shaving, flossing your teeth, or handling sharp objects.  Alcohol can change the body's ability to handle warfarin. It is best to avoid alcoholic drinks or consume only very small amounts while taking warfarin. Notify your health care provider if you change your alcohol intake.  Notify your dentist or other health care providers before procedures.  Eat a diet that includes 5 or more servings of fruits and vegetables each day. This may reduce the risk of stroke. Certain diets may be prescribed to address high blood pressure, high cholesterol, diabetes, or obesity.  A diet low in sodium, saturated fat, trans fat, and cholesterol is recommended to manage high blood pressure.  A diet low in saturated fat, trans fat, and cholesterol, and high in fiber may control cholesterol levels.  A controlled-carbohydrate, controlled-sugar diet is recommended to manage diabetes.  A reduced-calorie diet that is low in sodium, saturated fat, trans fat, and cholesterol is recommended to manage obesity.  Maintain a healthy weight.  Stay physically active. It is recommended that you get at least 30 minutes of activity on most or  all days.  Do not use any tobacco products, including cigarettes, chewing tobacco, or electronic cigarettes. If you need help quitting, ask your health care provider.  Limit alcohol intake to no more than 1 drink per day for nonpregnant women and 2 drinks per day for men. One drink equals 12 ounces of beer, 5 ounces of wine, or 1 ounces of hard liquor.  Do not abuse drugs.  A safe home environment is important to reduce the risk of falls. Your health care provider may arrange for specialists to evaluate your home. Having grab  bars in the bedroom and bathroom is often important. Your health care provider may arrange for equipment to be used at home, such as raised toilets and a seat for the shower.  Follow all instructions for follow-up with your health care provider. This is very important. This includes any referrals and lab tests. Proper follow-up can prevent a stroke or another TIA from occurring. PREVENTION  The risk of a TIA can be decreased by appropriately treating high blood pressure, high cholesterol, diabetes, heart disease, and obesity, and by quitting smoking, limiting alcohol, and staying physically active. SEEK MEDICAL CARE IF:  You have personality changes.  You have difficulty swallowing.  You are seeing double.  You have dizziness.  You have a fever. SEEK IMMEDIATE MEDICAL CARE IF:  Any of the following symptoms may represent a serious problem that is an emergency. Do not wait to see if the symptoms will go away. Get medical help right away. Call your local emergency services (911 in [place] Do not drive yourself to the hospital.  You have sudden weakness or numbness of the face, arm, or leg, especially on one side of the body.  You have sudden trouble walking or difficulty moving arms or legs.  You have sudden confusion.  You have trouble speaking (aphasia) or understanding.  You have sudden trouble seeing in one or both eyes.  You have a loss of balance or coordination.  You have a sudden, severe headache with no known cause.  You have new chest pain or an irregular heartbeat.  You have a partial or total loss of consciousness. MAKE SURE YOU:   Understand these instructions.  Will watch your condition.  Will get help right away if you are not doing well or get worse.   This information is not intended to replace advice given to you by your health care provider. Make sure you discuss any questions you have with your health care provider.   Document Released: 11/13/2004  Document Revised: 02/24/2014 Document Reviewed: 05/11/2013 Elsevier Interactive Patient Education [employer].

## 2015-06-07 NOTE — ED Notes (Signed)
MD made aware of pt's refusal of CT.

## 2015-06-07 NOTE — ED Notes (Signed)
Patient refused to go for CT scan - states she is feeling a lot better and does not think she needs it.

## 2015-06-07 NOTE — ED Notes (Signed)
Pt states she believes she panicked when she called EMS. States that she is feeling fine other than a minor soreness in eye. Does not want to continue with CT. States she will be taking a cab home.

## 2015-06-08 DIAGNOSIS — R21 Rash and other nonspecific skin eruption: Secondary | ICD-10-CM | POA: Diagnosis not present

## 2015-06-09 DIAGNOSIS — R51 Headache: Secondary | ICD-10-CM | POA: Diagnosis not present

## 2015-06-09 DIAGNOSIS — R6884 Jaw pain: Secondary | ICD-10-CM | POA: Diagnosis not present

## 2015-06-12 ENCOUNTER — Encounter: Payer: Self-pay | Admitting: Neurology

## 2015-06-12 ENCOUNTER — Ambulatory Visit (INDEPENDENT_AMBULATORY_CARE_PROVIDER_SITE_OTHER): Payer: Medicare Other | Admitting: Neurology

## 2015-06-12 VITALS — BP 140/70 | HR 85 | Ht 64.5 in | Wt 131.4 lb

## 2015-06-12 DIAGNOSIS — B029 Zoster without complications: Secondary | ICD-10-CM | POA: Diagnosis not present

## 2015-06-12 DIAGNOSIS — G518 Other disorders of facial nerve: Secondary | ICD-10-CM | POA: Diagnosis not present

## 2015-06-12 NOTE — Progress Notes (Signed)
Chart forwarded.  

## 2015-06-12 NOTE — Progress Notes (Addendum)
NEUROLOGY CONSULTATION NOTE  [name] [name] MRN: [id number] DOB: 1929/05/04  Referring provider: Dr. Felipa Eth Primary care provider: Dr. Felipa Eth  Reason for consult:  Trigeminal neuralgia  HISTORY OF PRESENT ILLNESS: [name] [name] is an 82 year old right-handed female with hypertension, hyperlipidemia, and history of lumbar disc surgery who presents for trigeminal neuralgia.  History obtained by patient, clinic notes and ED note.  Labs reviewed.  On 06/05/15, the corner of the left eye felt sore.  Then on Wednesday, the left side of her jaw had the sensation that it was "being drawn", however she did not note any facial droop.  She also noticed a small bump on the corner of her left eye.  Symptoms and discomfort persisted, so she went to Urgent Care on 06/08/15.  X-ray of sinuses were unremarkable.  She only had that one lesion.  A diagnosis could not be made but shingles was suspected.  She was prescribed Valtrex, but didn't start it.  Over the course of the past week, symptoms have gotten worse.  Initially, she experienced shooting electric pain from the left side of her forehead to the back of her head.  She also has developed a left sided facial droop.  She has developed a rash on the left side of her face, consisting of blisters.  There are also lesions on her inner lip as well.  There is no numbness or pruritus.  However, she experiences severe pain to just the lightest touch of the face.  She is able to close her eye but the left eye tears.  She denies changes in hearing.  She denies headache.  She started on gabapentin, which was just titrated to 300mg  three times daily.  She notes some mild relief.  She has remote history of right sided trigeminal neuralgia, but this is different.   PAST MEDICAL HISTORY: Past Medical History  Diagnosis Date  . Hypertension   . Hypokalemia   . HTN (hypertension)   . Hyperlipidemia   . Numbness on right side     SECONDARY TO ELECTROLYTES  ABNORMALITIES  . Hypomagnesemia   . Abnormal albumin     LOW  . Moderate anxiety     SERVE STRESS    PAST SURGICAL HISTORY: Past Surgical History  Procedure Laterality Date  . Lumbar disc surgery      MEDICATIONS: Current Outpatient Prescriptions on File Prior to Visit  Medication Sig Dispense Refill  . aspirin EC 81 MG tablet Take 81 mg by mouth daily.    . Calcium Carbonate (CALCIUM 600 PO) Take 1 tablet by mouth 2 (two) times daily.    . Coenzyme Q-10 100 MG capsule Take 100 mg by mouth daily.    . Multiple Vitamin (MULTIVITAMIN WITH MINERALS) TABS Take 1 tablet by mouth daily.    Marland Kitchen omega-3 acid ethyl esters (LOVAZA) 1 G capsule Take 1 g by mouth daily.    Marland Kitchen omeprazole (PRILOSEC OTC) 20 MG tablet Take 20 mg by mouth daily.    . quinapril (ACCUPRIL) 40 MG tablet Take 40 mg by mouth at bedtime.    . rosuvastatin (CRESTOR) 5 MG tablet Take 5 mg by mouth daily.     No current facility-administered medications on file prior to visit.    ALLERGIES: Allergies  Allergen Reactions  . Demerol Other (See Comments)    Passed out  . Iohexol      Code: RASH, Desc: Pt states she had an IVP 15-46yrs ago and broke out in a  rash all over her body.  She's never had IV contrast since., Onset Date: [id number]   . Suprax [Cefixime] Other (See Comments)    c diff  . Penicillins Rash    FAMILY HISTORY: Family History  Problem Relation Age of Onset  . Other Mother 30  . Cancer - Colon Father 25  . Cancer Sister 42    BREAST    SOCIAL HISTORY: Social History   Social History  . Marital Status: Widowed    Spouse Name: N/A  . Number of Children: N/A  . Years of Education: N/A   Occupational History  . Not on file.   Social History Main Topics  . Smoking status: Former Research scientist (life sciences)  . Smokeless tobacco: Never Used  . Alcohol Use: 0.0 oz/week    0 Standard drinks or equivalent per week  . Drug Use: No  . Sexual Activity: Not on file   Other Topics Concern  . Not on file    Social History Narrative   Lives alone in a one story home.  Has 1 daughter.  Retired Futures trader.  Education: high school.    REVIEW OF SYSTEMS: Constitutional: No fevers, chills, or sweats, no generalized fatigue, change in appetite Eyes: No visual changes, double vision, eye pain Ear, nose and throat: No hearing loss, ear pain, nasal congestion, sore throat Cardiovascular: No chest pain, palpitations Respiratory:  No shortness of breath at rest or with exertion, wheezes GastrointestinaI: No nausea, vomiting, diarrhea, abdominal pain, fecal incontinence Genitourinary:  No dysuria, urinary retention or frequency Musculoskeletal:  No neck pain, back pain Integumentary: as above Neurological: as above Psychiatric: No depression, insomnia, anxiety Endocrine: No palpitations, fatigue, diaphoresis, mood swings, change in appetite, change in weight, increased thirst Hematologic/Lymphatic:  No anemia, purpura, petechiae. Allergic/Immunologic: no itchy/runny eyes, nasal congestion, recent allergic reactions, rashes  PHYSICAL EXAM: Filed Vitals:   06/12/15 1242  BP: 140/70  Pulse: 85   General: No acute distress.  Patient appears well-groomed.  Head:  Normocephalic/atraumatic Eyes:  fundi examined but not visualized Neck: supple, no paraspinal tenderness, full range of motion Back: No paraspinal tenderness Heart: regular rate and rhythm Lungs: Clear to auscultation bilaterally. Vascular: No carotid bruits. Neurological Exam: Mental status: alert and oriented to person, place, and time, recent and remote memory intact, fund of knowledge intact, attention and concentration intact, speech fluent and not dysarthric, language intact. Cranial nerves: CN I: not tested CN II: pupils equal, round and reactive to light, visual fields intact CN III, IV, VI:  full range of motion, no nystagmus, no ptosis CN V: facial sensation intact CN VII: left facial weakness CN VIII: hearing  intact CN IX, X: gag intact, uvula midline CN XI: sternocleidomastoid and trapezius muscles intact CN XII: tongue midline Bulk & Tone: normal, no fasciculations. Motor:  5/5 throughout  Sensation:  Pinprick and vibration sensation intact. Deep Tendon Reflexes:  2+ throughout, toes downgoing.  Finger to nose testing:  Without dysmetria.  Heel to shin:  Without dysmetria.  Gait:  Normal station and stride.  Able to turn and tandem walk. Romberg negative.  IMPRESSION: Left facial weakness and neuralgia, associated with left facial rash.  It doesn't look like shingles to me.  They look like blisters but not pustules and are not crusted over.  There is no associated hyperacusis or lesions in her external auditory canal to suggest Ramsay Hunt syndrome.  It is likely some type of infectious process affecting the cranial nerve.  It is not trigeminal neuralgia.  There is no clear upper facial weakness, although her eye tears, but I cannot imagine that this is a CNS process given this accompanying rash.  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  We will contact [name] [name] to get an ophthalmologic exam.  PLAN: 1.  I will try and get her to see a dermatologist ASAP to help determine what type of rash this is and to help determine if any other testing from my end is necessary 2.  In the meantime, I will have her continue gabapentin 300mg  three times daily.  I want her to contact me on Friday with update.  If pain not controlled, we will continue titrating up. 3.  I think the rash needs to be evaluated ASAP.  Her PCP office is downstairs.  They never saw her since onset of this rash, so she will go down to see if she can be evaluated, since this is out of scope of my practice. 4.  Follow up  Thank you for allowing me to take part in the care of this  patient.  Metta Clines, DO  CC:  Lajean Manes, MD

## 2015-06-12 NOTE — Patient Instructions (Signed)
1.  I would like you evaluated first by Dr. Ubaldo Glassing to determine what this rash could be.  This will help me decide how we should proceed in regards to further workup 2.  Continue gabapentin 300mg  three times daily.  Contact me on Friday with update.  If pain not significantly better, we can increase dose. 3.  Follow up

## 2015-06-13 DIAGNOSIS — B029 Zoster without complications: Secondary | ICD-10-CM | POA: Diagnosis not present

## 2015-06-13 DIAGNOSIS — Z85828 Personal history of other malignant neoplasm of skin: Secondary | ICD-10-CM | POA: Diagnosis not present

## 2015-06-13 DIAGNOSIS — B0059 Other herpesviral disease of eye: Secondary | ICD-10-CM | POA: Diagnosis not present

## 2015-06-15 ENCOUNTER — Telehealth: Payer: Self-pay

## 2015-06-15 NOTE — Telephone Encounter (Signed)
Pt states she appreciates the offer, however, states that her dermatologist and her ophthalmologist are taking care of her.

## 2015-06-15 NOTE — Telephone Encounter (Signed)
-----   Message from Pieter Partridge, DO sent at 06/14/2015  7:54 AM EDT ----- I received Dr. Delene Ruffini note.  We can proceed with the gabapentin to help the pain.  Otherwise, we can try Lyrica 75mg  twice daily, which is indicated for post-herpetic pain.  Because she has the facial droop, I would also like to get an MRI of the brain with and without contrast.  Shingles can affect any nerve but the facial nerve is atypical, so I want to make sure there isn't anything else to account for it.  (indication:  Facial droop)

## 2015-06-18 ENCOUNTER — Telehealth: Payer: Self-pay

## 2015-06-18 DIAGNOSIS — G518 Other disorders of facial nerve: Secondary | ICD-10-CM

## 2015-06-18 NOTE — Telephone Encounter (Signed)
-----   Message from Pieter Partridge, DO sent at 06/18/2015  8:18 AM EDT ----- I would like for Ms. Gatling to see an ophthalmologist as this rash and nerve problem may sometimes affect the eye.

## 2015-06-18 NOTE — Telephone Encounter (Signed)
Pt has seen ophthalmologist, does have antibody for the eye.

## 2015-06-19 ENCOUNTER — Ambulatory Visit: Payer: Medicare Other | Admitting: Neurology

## 2015-06-21 DIAGNOSIS — Z85828 Personal history of other malignant neoplasm of skin: Secondary | ICD-10-CM | POA: Diagnosis not present

## 2015-06-21 DIAGNOSIS — B029 Zoster without complications: Secondary | ICD-10-CM | POA: Diagnosis not present

## 2015-06-27 DIAGNOSIS — B0059 Other herpesviral disease of eye: Secondary | ICD-10-CM | POA: Diagnosis not present

## 2015-06-27 DIAGNOSIS — H01003 Unspecified blepharitis right eye, unspecified eyelid: Secondary | ICD-10-CM | POA: Diagnosis not present

## 2015-06-29 DIAGNOSIS — B029 Zoster without complications: Secondary | ICD-10-CM | POA: Diagnosis not present

## 2015-06-29 DIAGNOSIS — Z85828 Personal history of other malignant neoplasm of skin: Secondary | ICD-10-CM | POA: Diagnosis not present

## 2015-07-10 DIAGNOSIS — B029 Zoster without complications: Secondary | ICD-10-CM | POA: Diagnosis not present

## 2015-07-10 DIAGNOSIS — B0059 Other herpesviral disease of eye: Secondary | ICD-10-CM | POA: Diagnosis not present

## 2015-07-18 DIAGNOSIS — B0229 Other postherpetic nervous system involvement: Secondary | ICD-10-CM | POA: Diagnosis not present

## 2015-07-18 DIAGNOSIS — Z85828 Personal history of other malignant neoplasm of skin: Secondary | ICD-10-CM | POA: Diagnosis not present

## 2015-08-13 DIAGNOSIS — Z4689 Encounter for fitting and adjustment of other specified devices: Secondary | ICD-10-CM | POA: Diagnosis not present

## 2015-08-13 DIAGNOSIS — N8110 Cystocele, unspecified: Secondary | ICD-10-CM | POA: Diagnosis not present

## 2015-08-13 DIAGNOSIS — N952 Postmenopausal atrophic vaginitis: Secondary | ICD-10-CM | POA: Diagnosis not present

## 2015-08-23 DIAGNOSIS — L720 Epidermal cyst: Secondary | ICD-10-CM | POA: Diagnosis not present

## 2015-08-23 DIAGNOSIS — B0229 Other postherpetic nervous system involvement: Secondary | ICD-10-CM | POA: Diagnosis not present

## 2015-08-23 DIAGNOSIS — Z85828 Personal history of other malignant neoplasm of skin: Secondary | ICD-10-CM | POA: Diagnosis not present

## 2015-08-23 DIAGNOSIS — L570 Actinic keratosis: Secondary | ICD-10-CM | POA: Diagnosis not present

## 2015-08-23 DIAGNOSIS — L821 Other seborrheic keratosis: Secondary | ICD-10-CM | POA: Diagnosis not present

## 2015-08-23 DIAGNOSIS — D485 Neoplasm of uncertain behavior of skin: Secondary | ICD-10-CM | POA: Diagnosis not present

## 2015-09-06 ENCOUNTER — Encounter: Payer: Medicare Other | Admitting: Interventional Cardiology

## 2015-10-10 DIAGNOSIS — Z85828 Personal history of other malignant neoplasm of skin: Secondary | ICD-10-CM | POA: Diagnosis not present

## 2015-10-10 DIAGNOSIS — B0229 Other postherpetic nervous system involvement: Secondary | ICD-10-CM | POA: Diagnosis not present

## 2015-10-10 DIAGNOSIS — L720 Epidermal cyst: Secondary | ICD-10-CM | POA: Diagnosis not present

## 2015-10-10 DIAGNOSIS — L821 Other seborrheic keratosis: Secondary | ICD-10-CM | POA: Diagnosis not present

## 2015-10-17 DIAGNOSIS — B029 Zoster without complications: Secondary | ICD-10-CM | POA: Diagnosis not present

## 2015-10-17 DIAGNOSIS — H00022 Hordeolum internum right lower eyelid: Secondary | ICD-10-CM | POA: Diagnosis not present

## 2015-10-17 DIAGNOSIS — B0059 Other herpesviral disease of eye: Secondary | ICD-10-CM | POA: Diagnosis not present

## 2015-10-23 DIAGNOSIS — H00025 Hordeolum internum left lower eyelid: Secondary | ICD-10-CM | POA: Diagnosis not present

## 2015-10-23 DIAGNOSIS — H00022 Hordeolum internum right lower eyelid: Secondary | ICD-10-CM | POA: Diagnosis not present

## 2015-10-30 DIAGNOSIS — I10 Essential (primary) hypertension: Secondary | ICD-10-CM | POA: Diagnosis not present

## 2015-10-30 DIAGNOSIS — Z79899 Other long term (current) drug therapy: Secondary | ICD-10-CM | POA: Diagnosis not present

## 2015-10-30 DIAGNOSIS — H00022 Hordeolum internum right lower eyelid: Secondary | ICD-10-CM | POA: Diagnosis not present

## 2015-10-30 DIAGNOSIS — H00025 Hordeolum internum left lower eyelid: Secondary | ICD-10-CM | POA: Diagnosis not present

## 2015-10-30 DIAGNOSIS — E7800 Pure hypercholesterolemia, unspecified: Secondary | ICD-10-CM | POA: Diagnosis not present

## 2015-10-30 DIAGNOSIS — B0229 Other postherpetic nervous system involvement: Secondary | ICD-10-CM | POA: Diagnosis not present

## 2015-10-30 DIAGNOSIS — H5712 Ocular pain, left eye: Secondary | ICD-10-CM | POA: Diagnosis not present

## 2015-11-08 DIAGNOSIS — Z85828 Personal history of other malignant neoplasm of skin: Secondary | ICD-10-CM | POA: Diagnosis not present

## 2015-11-08 DIAGNOSIS — B0229 Other postherpetic nervous system involvement: Secondary | ICD-10-CM | POA: Diagnosis not present

## 2015-11-12 DIAGNOSIS — H02056 Trichiasis without entropian left eye, unspecified eyelid: Secondary | ICD-10-CM | POA: Diagnosis not present

## 2015-11-12 DIAGNOSIS — H00025 Hordeolum internum left lower eyelid: Secondary | ICD-10-CM | POA: Diagnosis not present

## 2015-11-13 DIAGNOSIS — N952 Postmenopausal atrophic vaginitis: Secondary | ICD-10-CM | POA: Diagnosis not present

## 2015-11-13 DIAGNOSIS — N8110 Cystocele, unspecified: Secondary | ICD-10-CM | POA: Diagnosis not present

## 2015-11-13 DIAGNOSIS — H00025 Hordeolum internum left lower eyelid: Secondary | ICD-10-CM | POA: Diagnosis not present

## 2015-11-13 DIAGNOSIS — Z4689 Encounter for fitting and adjustment of other specified devices: Secondary | ICD-10-CM | POA: Diagnosis not present

## 2015-11-21 DIAGNOSIS — R300 Dysuria: Secondary | ICD-10-CM | POA: Diagnosis not present

## 2015-11-21 DIAGNOSIS — Z23 Encounter for immunization: Secondary | ICD-10-CM | POA: Diagnosis not present

## 2015-11-21 DIAGNOSIS — B0229 Other postherpetic nervous system involvement: Secondary | ICD-10-CM | POA: Diagnosis not present

## 2015-11-29 DIAGNOSIS — N390 Urinary tract infection, site not specified: Secondary | ICD-10-CM | POA: Diagnosis not present

## 2015-12-07 DIAGNOSIS — N390 Urinary tract infection, site not specified: Secondary | ICD-10-CM | POA: Diagnosis not present

## 2015-12-10 DIAGNOSIS — N390 Urinary tract infection, site not specified: Secondary | ICD-10-CM | POA: Diagnosis not present

## 2015-12-10 DIAGNOSIS — B0229 Other postherpetic nervous system involvement: Secondary | ICD-10-CM | POA: Diagnosis not present

## 2015-12-10 DIAGNOSIS — I10 Essential (primary) hypertension: Secondary | ICD-10-CM | POA: Diagnosis not present

## 2015-12-26 DIAGNOSIS — M654 Radial styloid tenosynovitis [de Quervain]: Secondary | ICD-10-CM | POA: Diagnosis not present

## 2015-12-26 DIAGNOSIS — M25532 Pain in left wrist: Secondary | ICD-10-CM | POA: Diagnosis not present

## 2016-01-01 DIAGNOSIS — B0229 Other postherpetic nervous system involvement: Secondary | ICD-10-CM | POA: Diagnosis not present

## 2016-01-01 DIAGNOSIS — I10 Essential (primary) hypertension: Secondary | ICD-10-CM | POA: Diagnosis not present

## 2016-01-01 DIAGNOSIS — M25532 Pain in left wrist: Secondary | ICD-10-CM | POA: Diagnosis not present

## 2016-01-16 DIAGNOSIS — B0229 Other postherpetic nervous system involvement: Secondary | ICD-10-CM | POA: Diagnosis not present

## 2016-01-16 DIAGNOSIS — L720 Epidermal cyst: Secondary | ICD-10-CM | POA: Diagnosis not present

## 2016-01-16 DIAGNOSIS — Z85828 Personal history of other malignant neoplasm of skin: Secondary | ICD-10-CM | POA: Diagnosis not present

## 2016-01-16 DIAGNOSIS — L570 Actinic keratosis: Secondary | ICD-10-CM | POA: Diagnosis not present

## 2016-01-21 DIAGNOSIS — H02105 Unspecified ectropion of left lower eyelid: Secondary | ICD-10-CM | POA: Diagnosis not present

## 2016-01-21 DIAGNOSIS — H40013 Open angle with borderline findings, low risk, bilateral: Secondary | ICD-10-CM | POA: Diagnosis not present

## 2016-01-21 DIAGNOSIS — H00022 Hordeolum internum right lower eyelid: Secondary | ICD-10-CM | POA: Diagnosis not present

## 2016-01-21 DIAGNOSIS — H04123 Dry eye syndrome of bilateral lacrimal glands: Secondary | ICD-10-CM | POA: Diagnosis not present

## 2016-01-29 DIAGNOSIS — N390 Urinary tract infection, site not specified: Secondary | ICD-10-CM | POA: Diagnosis not present

## 2016-02-14 DIAGNOSIS — N952 Postmenopausal atrophic vaginitis: Secondary | ICD-10-CM | POA: Diagnosis not present

## 2016-02-14 DIAGNOSIS — N8110 Cystocele, unspecified: Secondary | ICD-10-CM | POA: Diagnosis not present

## 2016-02-14 DIAGNOSIS — Z4689 Encounter for fitting and adjustment of other specified devices: Secondary | ICD-10-CM | POA: Diagnosis not present

## 2016-02-28 DIAGNOSIS — D2272 Melanocytic nevi of left lower limb, including hip: Secondary | ICD-10-CM | POA: Diagnosis not present

## 2016-02-28 DIAGNOSIS — L723 Sebaceous cyst: Secondary | ICD-10-CM | POA: Diagnosis not present

## 2016-02-28 DIAGNOSIS — D225 Melanocytic nevi of trunk: Secondary | ICD-10-CM | POA: Diagnosis not present

## 2016-02-28 DIAGNOSIS — Z85828 Personal history of other malignant neoplasm of skin: Secondary | ICD-10-CM | POA: Diagnosis not present

## 2016-02-28 DIAGNOSIS — L821 Other seborrheic keratosis: Secondary | ICD-10-CM | POA: Diagnosis not present

## 2016-02-28 DIAGNOSIS — D1801 Hemangioma of skin and subcutaneous tissue: Secondary | ICD-10-CM | POA: Diagnosis not present

## 2016-02-28 DIAGNOSIS — D2271 Melanocytic nevi of right lower limb, including hip: Secondary | ICD-10-CM | POA: Diagnosis not present

## 2016-02-28 DIAGNOSIS — L814 Other melanin hyperpigmentation: Secondary | ICD-10-CM | POA: Diagnosis not present

## 2016-04-14 DIAGNOSIS — R1084 Generalized abdominal pain: Secondary | ICD-10-CM | POA: Diagnosis not present

## 2016-04-14 DIAGNOSIS — I10 Essential (primary) hypertension: Secondary | ICD-10-CM | POA: Diagnosis not present

## 2016-04-14 DIAGNOSIS — E7800 Pure hypercholesterolemia, unspecified: Secondary | ICD-10-CM | POA: Diagnosis not present

## 2016-04-14 DIAGNOSIS — Z79899 Other long term (current) drug therapy: Secondary | ICD-10-CM | POA: Diagnosis not present

## 2016-04-14 DIAGNOSIS — R5383 Other fatigue: Secondary | ICD-10-CM | POA: Diagnosis not present

## 2016-04-14 DIAGNOSIS — B0229 Other postherpetic nervous system involvement: Secondary | ICD-10-CM | POA: Diagnosis not present

## 2016-05-01 ENCOUNTER — Other Ambulatory Visit: Payer: Self-pay | Admitting: Internal Medicine

## 2016-05-01 ENCOUNTER — Ambulatory Visit
Admission: RE | Admit: 2016-05-01 | Discharge: 2016-05-01 | Disposition: A | Discharge status: Home / Self Care | Payer: Medicare Other | Source: Ambulatory Visit | Attending: Internal Medicine | Admitting: Internal Medicine

## 2016-05-01 DIAGNOSIS — M25511 Pain in right shoulder: Secondary | ICD-10-CM

## 2016-05-01 DIAGNOSIS — M79601 Pain in right arm: Secondary | ICD-10-CM

## 2016-05-07 DIAGNOSIS — M25511 Pain in right shoulder: Secondary | ICD-10-CM | POA: Diagnosis not present

## 2016-05-15 ENCOUNTER — Other Ambulatory Visit: Payer: Self-pay | Admitting: Obstetrics & Gynecology

## 2016-05-15 DIAGNOSIS — N952 Postmenopausal atrophic vaginitis: Secondary | ICD-10-CM | POA: Diagnosis not present

## 2016-05-15 DIAGNOSIS — N8110 Cystocele, unspecified: Secondary | ICD-10-CM | POA: Diagnosis not present

## 2016-05-15 DIAGNOSIS — N841 Polyp of cervix uteri: Secondary | ICD-10-CM | POA: Diagnosis not present

## 2016-05-21 DIAGNOSIS — I10 Essential (primary) hypertension: Secondary | ICD-10-CM | POA: Diagnosis not present

## 2016-05-21 DIAGNOSIS — E7800 Pure hypercholesterolemia, unspecified: Secondary | ICD-10-CM | POA: Diagnosis not present

## 2016-05-21 DIAGNOSIS — B0229 Other postherpetic nervous system involvement: Secondary | ICD-10-CM | POA: Diagnosis not present

## 2016-05-21 DIAGNOSIS — Z0000 Encounter for general adult medical examination without abnormal findings: Secondary | ICD-10-CM | POA: Diagnosis not present

## 2016-05-21 DIAGNOSIS — Z1389 Encounter for screening for other disorder: Secondary | ICD-10-CM | POA: Diagnosis not present

## 2016-05-23 DIAGNOSIS — Z4689 Encounter for fitting and adjustment of other specified devices: Secondary | ICD-10-CM | POA: Diagnosis not present

## 2016-05-23 DIAGNOSIS — N8110 Cystocele, unspecified: Secondary | ICD-10-CM | POA: Diagnosis not present

## 2016-05-23 DIAGNOSIS — N952 Postmenopausal atrophic vaginitis: Secondary | ICD-10-CM | POA: Diagnosis not present

## 2016-05-26 DIAGNOSIS — H5712 Ocular pain, left eye: Secondary | ICD-10-CM | POA: Diagnosis not present

## 2016-05-26 DIAGNOSIS — H40013 Open angle with borderline findings, low risk, bilateral: Secondary | ICD-10-CM | POA: Diagnosis not present

## 2016-05-26 DIAGNOSIS — B0229 Other postherpetic nervous system involvement: Secondary | ICD-10-CM | POA: Diagnosis not present

## 2016-05-26 DIAGNOSIS — H01003 Unspecified blepharitis right eye, unspecified eyelid: Secondary | ICD-10-CM | POA: Diagnosis not present

## 2016-06-04 DIAGNOSIS — M5442 Lumbago with sciatica, left side: Secondary | ICD-10-CM | POA: Diagnosis not present

## 2016-06-04 DIAGNOSIS — M25562 Pain in left knee: Secondary | ICD-10-CM | POA: Diagnosis not present

## 2016-06-18 DIAGNOSIS — H353133 Nonexudative age-related macular degeneration, bilateral, advanced atrophic without subfoveal involvement: Secondary | ICD-10-CM | POA: Diagnosis not present

## 2016-06-18 DIAGNOSIS — H40013 Open angle with borderline findings, low risk, bilateral: Secondary | ICD-10-CM | POA: Diagnosis not present

## 2016-06-18 DIAGNOSIS — Z961 Presence of intraocular lens: Secondary | ICD-10-CM | POA: Diagnosis not present

## 2016-06-18 DIAGNOSIS — H1851 Endothelial corneal dystrophy: Secondary | ICD-10-CM | POA: Diagnosis not present

## 2016-07-03 DIAGNOSIS — M25562 Pain in left knee: Secondary | ICD-10-CM | POA: Diagnosis not present

## 2016-07-03 DIAGNOSIS — R2681 Unsteadiness on feet: Secondary | ICD-10-CM | POA: Diagnosis not present

## 2016-07-09 DIAGNOSIS — M6281 Muscle weakness (generalized): Secondary | ICD-10-CM | POA: Diagnosis not present

## 2016-07-09 DIAGNOSIS — I10 Essential (primary) hypertension: Secondary | ICD-10-CM | POA: Diagnosis not present

## 2016-07-09 DIAGNOSIS — R262 Difficulty in walking, not elsewhere classified: Secondary | ICD-10-CM | POA: Diagnosis not present

## 2016-07-15 DIAGNOSIS — R262 Difficulty in walking, not elsewhere classified: Secondary | ICD-10-CM | POA: Diagnosis not present

## 2016-07-15 DIAGNOSIS — I10 Essential (primary) hypertension: Secondary | ICD-10-CM | POA: Diagnosis not present

## 2016-07-15 DIAGNOSIS — M6281 Muscle weakness (generalized): Secondary | ICD-10-CM | POA: Diagnosis not present

## 2016-07-17 DIAGNOSIS — R262 Difficulty in walking, not elsewhere classified: Secondary | ICD-10-CM | POA: Diagnosis not present

## 2016-07-17 DIAGNOSIS — I10 Essential (primary) hypertension: Secondary | ICD-10-CM | POA: Diagnosis not present

## 2016-07-17 DIAGNOSIS — M6281 Muscle weakness (generalized): Secondary | ICD-10-CM | POA: Diagnosis not present

## 2016-07-21 DIAGNOSIS — F321 Major depressive disorder, single episode, moderate: Secondary | ICD-10-CM | POA: Diagnosis not present

## 2016-07-21 DIAGNOSIS — I10 Essential (primary) hypertension: Secondary | ICD-10-CM | POA: Diagnosis not present

## 2016-07-21 DIAGNOSIS — B0229 Other postherpetic nervous system involvement: Secondary | ICD-10-CM | POA: Diagnosis not present

## 2016-07-22 DIAGNOSIS — I10 Essential (primary) hypertension: Secondary | ICD-10-CM | POA: Diagnosis not present

## 2016-07-22 DIAGNOSIS — M6281 Muscle weakness (generalized): Secondary | ICD-10-CM | POA: Diagnosis not present

## 2016-07-22 DIAGNOSIS — R262 Difficulty in walking, not elsewhere classified: Secondary | ICD-10-CM | POA: Diagnosis not present

## 2016-07-24 ENCOUNTER — Encounter (HOSPITAL_COMMUNITY): Payer: Self-pay

## 2016-07-24 ENCOUNTER — Emergency Department (HOSPITAL_COMMUNITY)
Admission: EM | Admit: 2016-07-24 | Discharge: 2016-07-24 | Disposition: A | Discharge status: Home / Self Care | Payer: Medicare Other | Attending: Emergency Medicine | Admitting: Emergency Medicine

## 2016-07-24 DIAGNOSIS — R110 Nausea: Secondary | ICD-10-CM | POA: Diagnosis not present

## 2016-07-24 DIAGNOSIS — K2970 Gastritis, unspecified, without bleeding: Secondary | ICD-10-CM | POA: Diagnosis not present

## 2016-07-24 DIAGNOSIS — E860 Dehydration: Secondary | ICD-10-CM | POA: Diagnosis not present

## 2016-07-24 DIAGNOSIS — R531 Weakness: Secondary | ICD-10-CM | POA: Diagnosis not present

## 2016-07-24 DIAGNOSIS — G8929 Other chronic pain: Secondary | ICD-10-CM | POA: Diagnosis not present

## 2016-07-24 DIAGNOSIS — Z87891 Personal history of nicotine dependence: Secondary | ICD-10-CM | POA: Diagnosis not present

## 2016-07-24 DIAGNOSIS — Z7982 Long term (current) use of aspirin: Secondary | ICD-10-CM | POA: Insufficient documentation

## 2016-07-24 DIAGNOSIS — Z79899 Other long term (current) drug therapy: Secondary | ICD-10-CM | POA: Insufficient documentation

## 2016-07-24 DIAGNOSIS — I10 Essential (primary) hypertension: Secondary | ICD-10-CM | POA: Diagnosis not present

## 2016-07-24 LAB — COMPREHENSIVE METABOLIC PANEL
ALBUMIN: 4.8 g/dL (ref 3.5–5.0)
ALK PHOS: 97 U/L (ref 38–126)
ALT: 20 U/L (ref 14–54)
AST: 21 U/L (ref 15–41)
Anion gap: 12 (ref 5–15)
BILIRUBIN TOTAL: 0.5 mg/dL (ref 0.3–1.2)
BUN: 7 mg/dL (ref 6–20)
CALCIUM: 9.8 mg/dL (ref 8.9–10.3)
CO2: 25 mmol/L (ref 22–32)
CREATININE: 0.47 mg/dL (ref 0.44–1.00)
Chloride: 98 mmol/L — ABNORMAL LOW (ref 101–111)
GFR calc Af Amer: >60 mL/min (ref >60)
GFR calc non Af Amer: >60 mL/min (ref >60)
GLUCOSE: 119 mg/dL — AB (ref 65–99)
Potassium: 3.5 mmol/L (ref 3.5–5.1)
SODIUM: 135 mmol/L (ref 135–145)
TOTAL PROTEIN: 8.3 g/dL — AB (ref 6.5–8.1)

## 2016-07-24 LAB — URINALYSIS, ROUTINE W REFLEX MICROSCOPIC
Bilirubin Urine: NEGATIVE
GLUCOSE, UA: NEGATIVE mg/dL
Hgb urine dipstick: NEGATIVE
Ketones, ur: NEGATIVE mg/dL
Nitrite: NEGATIVE
PROTEIN: NEGATIVE mg/dL
SPECIFIC GRAVITY, URINE: 1.005 (ref 1.005–1.030)
pH: 8 (ref 5.0–8.0)

## 2016-07-24 LAB — CBC WITH DIFFERENTIAL/PLATELET
BASOS ABS: 0 10*3/uL (ref 0.0–0.1)
BASOS PCT: 1 %
EOS ABS: 0.1 10*3/uL (ref 0.0–0.7)
EOS PCT: 1 %
HEMATOCRIT: 42.5 % (ref 36.0–46.0)
Hemoglobin: 15.4 g/dL — ABNORMAL HIGH (ref 12.0–15.0)
Lymphocytes Relative: 29 %
Lymphs Abs: 1.6 10*3/uL (ref 0.7–4.0)
MCH: 30 pg (ref 26.0–34.0)
MCHC: 36.2 g/dL — AB (ref 30.0–36.0)
MCV: 82.7 fL (ref 78.0–100.0)
MONO ABS: 0.3 10*3/uL (ref 0.1–1.0)
MONOS PCT: 5 %
NEUTROS ABS: 3.6 10*3/uL (ref 1.7–7.7)
Neutrophils Relative %: 64 %
PLATELETS: 298 10*3/uL (ref 150–400)
RBC: 5.14 MIL/uL — ABNORMAL HIGH (ref 3.87–5.11)
RDW: 13.3 % (ref 11.5–15.5)
WBC: 5.5 10*3/uL (ref 4.0–10.5)

## 2016-07-24 LAB — I-STAT TROPONIN, ED: Troponin i, poc: 0 ng/mL (ref 0.00–0.08)

## 2016-07-24 MED ORDER — SODIUM CHLORIDE 0.9 % IV BOLUS (SEPSIS)
1000.0000 mL | Freq: Once | INTRAVENOUS | Status: AC
  Administered 2016-07-24: 1000 mL via INTRAVENOUS

## 2016-07-24 NOTE — Discharge Instructions (Signed)
Make sure that you drink at least six 8 ounce glasses of water or Gatorade each day in order to stay well-hydrated. Call Dr. Felipa Eth to discuss the possibility of referral to pain clinic. Return if concern for any reason

## 2016-07-24 NOTE — ED Triage Notes (Signed)
Per [facility]- Pt resides at home. Full Code. Generalized weakness x 1 month. Shingles since January. PCP on Monday for weakness and nausea (chronic). However no medication for nausea. DX weakness from PCP. Stomach pain x 1 week. Denies chest pain and shortness of breath and fever. Pt continues to decline. Pt here for evaluation. PCP was not going to do blood work because of complete evaluation in February.

## 2016-07-24 NOTE — ED Notes (Signed)
Bed: RESB Expected date:  Expected time:  Means of arrival:  Comments: EMS weakness

## 2016-07-24 NOTE — ED Provider Notes (Signed)
[facility] DEPT Provider Note   CSN: [id number] Arrival date & time: 07/24/16  8032     History   Chief Complaint Chief Complaint  Patient presents with  . Weakness  . Nausea  . Abdominal Pain    HPI [name] [name] is a 83 y.o. female.Complains of generalized weakness for one month. Accompanied by intermittent nausea. She felt more weak this morning and was nauseated this morning. Weakness worse with standing improve with lying down. She denies any nausea presently. She feels improved presently over earlier this morning. She denies any chest pain denies abdominal pain she was prescribed Cymbalta for depression by her PCP Dr. Felipa Eth earlier this week, has had 1 dose thus far. She is no longer nauseated. No other associated symptoms. Last bowel movement yesterday, normal. No blood per rectum. No black stools. No treatment prior to coming here  HPI  Past Medical History:  Diagnosis Date  . Abnormal albumin    LOW  . HTN (hypertension)   . Hyperlipidemia   . Hypertension   . Hypokalemia   . Hypomagnesemia   . Moderate anxiety    SERVE STRESS  . Numbness on right side    SECONDARY TO ELECTROLYTES ABNORMALITIES    Patient Active Problem List   Diagnosis Date Noted  . Chest pain 03/28/2015    Past Surgical History:  Procedure Laterality Date  . LUMBAR DISC SURGERY      OB History    No data available       Home Medications    Prior to Admission medications   Medication Sig Start Date End Date Taking? Authorizing Provider  amLODipine (NORVASC) 10 MG tablet  05/14/15   [provider]  aspirin EC 81 MG tablet Take 81 mg by mouth daily.    [provider]  Calcium Carbonate (CALCIUM 600 PO) Take 1 tablet by mouth 2 (two) times daily.    [provider]  Coenzyme Q-10 100 MG capsule Take 100 mg by mouth daily.    [provider]  ESTRACE VAGINAL 0.1 MG/GM vaginal cream APPLY ONE APPLICATION TO AFFECTED AREA VAGINALLY ONCE  DAILY FOR 30 DAYS 05/11/15   [provider]  gabapentin (NEURONTIN) 300 MG capsule TK ONE C PO TID 06/09/15   [provider]  Multiple Vitamin (MULTIVITAMIN WITH MINERALS) TABS Take 1 tablet by mouth daily.    [provider]  omega-3 acid ethyl esters (LOVAZA) 1 G capsule Take 1 g by mouth daily.    [provider]  omeprazole (PRILOSEC OTC) 20 MG tablet Take 20 mg by mouth daily.    [provider]  quinapril (ACCUPRIL) 40 MG tablet Take 40 mg by mouth at bedtime.    [provider]  rosuvastatin (CRESTOR) 5 MG tablet Take 5 mg by mouth daily.    [provider]  sulfamethoxazole-trimethoprim (BACTRIM DS,SEPTRA DS) 800-160 MG tablet TK 1 T PO BID FOR 7 DAYS 05/11/15   [provider]  valACYclovir (VALTREX) 1000 MG tablet TK 1 T PO TID 06/08/15   [provider]    Family History Family History  Problem Relation Age of Onset  . Other Mother 40  . Cancer - Colon Father 23  . Cancer Sister 39       BREAST    Social History Social History  Substance Use Topics  . Smoking status: Former Research scientist (life sciences)  . Smokeless tobacco: Never Used  . Alcohol use 0.0 oz/week     Allergies  Demerol; Iohexol; Suprax [cefixime]; and Penicillins   Review of Systems Review of Systems  Constitutional: Positive for unexpected weight change.       Reports losing 6 pounds over the past year  HENT: Negative.        Facial pain from post herpetic neuralgia for the past one year  Respiratory: Negative.   Cardiovascular: Negative.   Gastrointestinal: Positive for nausea.  Musculoskeletal: Negative.   Skin: Negative.   Neurological: Positive for weakness.  Psychiatric/Behavioral: Negative.   All other systems reviewed and are negative.    Physical Exam Updated Vital Signs BP (!) 175/83 (BP Location: Right Arm)   Pulse 77   Temp 97.7 F (36.5 C) (Oral)   Resp 18   SpO2 100%   Physical Exam  Constitutional: She appears  well-developed and well-nourished.  HENT:  Head: Normocephalic and atraumatic.  Mucous membranes dry  Eyes: Conjunctivae are normal. Pupils are equal, round, and reactive to light.  Neck: Neck supple. No tracheal deviation present. No thyromegaly present.  Cardiovascular: Normal rate and regular rhythm.   No murmur heard. Pulmonary/Chest: Effort normal and breath sounds normal.  Abdominal: Soft. Bowel sounds are normal. She exhibits no distension. There is no tenderness.  Musculoskeletal: Normal range of motion. She exhibits no edema or tenderness.  Neurological: She is alert. Coordination normal.  Skin: Skin is warm and dry. No rash noted.  Psychiatric: She has a normal mood and affect.  Nursing note and vitals reviewed.    ED Treatments / Results  Labs (all labs ordered are listed, but only abnormal results are displayed) Labs Reviewed  COMPREHENSIVE METABOLIC PANEL  CBC WITH DIFFERENTIAL/PLATELET  URINALYSIS, ROUTINE W REFLEX MICROSCOPIC  I-STAT TROPOININ, ED    EKG  EKG Interpretation  Date/Time:  Thursday July 24 2016 11:05:39 EDT Ventricular Rate:  69 PR Interval:    QRS Duration: 93 QT Interval:  418 QTC Calculation: 448 R Axis:   55 Text Interpretation:  Sinus rhythm No significant change since last tracing Confirmed by Orlie Dakin [phone]) on 07/24/2016 11:11:34 AM      Results for orders placed or performed during the hospital encounter of 07/24/16  Comprehensive metabolic panel  Result Value Ref Range   Sodium 135 135 - 145 mmol/L   Potassium 3.5 3.5 - 5.1 mmol/L   Chloride 98 (L) 101 - 111 mmol/L   CO2 25 22 - 32 mmol/L   Glucose, Bld 119 (H) 65 - 99 mg/dL   BUN 7 6 - 20 mg/dL   Creatinine, Ser 0.47 0.44 - 1.00 mg/dL   Calcium 9.8 8.9 - 10.3 mg/dL   Total Protein 8.3 (H) 6.5 - 8.1 g/dL   Albumin 4.8 3.5 - 5.0 g/dL   AST 21 15 - 41 U/L   ALT 20 14 - 54 U/L   Alkaline Phosphatase 97 38 - 126 U/L   Total Bilirubin 0.5 0.3 - 1.2 mg/dL   GFR calc non  Af Amer >60 >60 mL/min   GFR calc Af Amer >60 >60 mL/min   Anion gap 12 5 - 15  CBC with Differential/Platelet  Result Value Ref Range   WBC 5.5 4.0 - 10.5 K/uL   RBC 5.14 (H) 3.87 - 5.11 MIL/uL   Hemoglobin 15.4 (H) 12.0 - 15.0 g/dL   HCT 42.5 36.0 - 46.0 %   MCV 82.7 78.0 - 100.0 fL   MCH 30.0 26.0 - 34.0 pg   MCHC 36.2 (H) 30.0 - 36.0 g/dL   RDW 13.3  11.5 - 15.5 %   Platelets 298 150 - 400 K/uL   Neutrophils Relative % 64 %   Neutro Abs 3.6 1.7 - 7.7 K/uL   Lymphocytes Relative 29 %   Lymphs Abs 1.6 0.7 - 4.0 K/uL   Monocytes Relative 5 %   Monocytes Absolute 0.3 0.1 - 1.0 K/uL   Eosinophils Relative 1 %   Eosinophils Absolute 0.1 0.0 - 0.7 K/uL   Basophils Relative 1 %   Basophils Absolute 0.0 0.0 - 0.1 K/uL  Urinalysis, Routine w reflex microscopic  Result Value Ref Range   Color, Urine COLORLESS (A) YELLOW   APPearance CLEAR CLEAR   Specific Gravity, Urine 1.005 1.005 - 1.030   pH 8.0 5.0 - 8.0   Glucose, UA NEGATIVE NEGATIVE mg/dL   Hgb urine dipstick NEGATIVE NEGATIVE   Bilirubin Urine NEGATIVE NEGATIVE   Ketones, ur NEGATIVE NEGATIVE mg/dL   Protein, ur NEGATIVE NEGATIVE mg/dL   Nitrite NEGATIVE NEGATIVE   Leukocytes, UA SMALL (A) NEGATIVE   RBC / HPF 0-5 0 - 5 RBC/hpf   WBC, UA 0-5 0 - 5 WBC/hpf   Bacteria, UA RARE (A) NONE SEEN   Squamous Epithelial / LPF 0-5 (A) NONE SEEN  I-stat troponin, ED  Result Value Ref Range   Troponin i, poc 0.00 0.00 - 0.08 ng/mL   Comment 3           No results found.  Radiology No results found.  Procedures Procedures (including critical care time)  Medications Ordered in ED Medications  sodium chloride 0.9 % bolus 1,000 mL (not administered)   Initial Impression/assessment and plan/ED course  1:45 PM feels improved after treatment with intravenous hydration. She also ate all here. Patient requests referral to pain clinic. I suggest that she contact Dr. Felipa Eth for referral to pain clinic. I also encourage oral  hydration. I feel weakness is likely segmented mild dehydration.  Initial Impression / Assessment and Plan / ED Course  I have reviewed the triage vital signs and the nursing notes.  Pertinent labs & imaging results that were available during my care of the patient were reviewed by me and considered in my medical decision making (see chart for details).       Final Clinical Impressions(s) / ED Diagnoses  Dx #1 dehydration #2 generalized weakness #3 chronic pain Final diagnoses:  None    New Prescriptions New Prescriptions   No medications on file     Orlie Dakin, MD 07/24/16 1351

## 2016-07-24 NOTE — ED Notes (Signed)
PT STATES SHE IS READY TO GO HOME. TOLERATED [place] SANDWICH AND GINGER-ALE

## 2016-07-29 DIAGNOSIS — M6281 Muscle weakness (generalized): Secondary | ICD-10-CM | POA: Diagnosis not present

## 2016-07-29 DIAGNOSIS — I10 Essential (primary) hypertension: Secondary | ICD-10-CM | POA: Diagnosis not present

## 2016-07-29 DIAGNOSIS — R262 Difficulty in walking, not elsewhere classified: Secondary | ICD-10-CM | POA: Diagnosis not present

## 2016-08-28 DIAGNOSIS — N952 Postmenopausal atrophic vaginitis: Secondary | ICD-10-CM | POA: Diagnosis not present

## 2016-08-28 DIAGNOSIS — N8110 Cystocele, unspecified: Secondary | ICD-10-CM | POA: Diagnosis not present

## 2016-08-28 DIAGNOSIS — Z4689 Encounter for fitting and adjustment of other specified devices: Secondary | ICD-10-CM | POA: Diagnosis not present

## 2016-09-23 DIAGNOSIS — B0229 Other postherpetic nervous system involvement: Secondary | ICD-10-CM | POA: Diagnosis not present

## 2016-10-22 DIAGNOSIS — I10 Essential (primary) hypertension: Secondary | ICD-10-CM | POA: Diagnosis not present

## 2016-10-22 DIAGNOSIS — B0229 Other postherpetic nervous system involvement: Secondary | ICD-10-CM | POA: Diagnosis not present

## 2016-10-22 DIAGNOSIS — M25561 Pain in right knee: Secondary | ICD-10-CM | POA: Diagnosis not present

## 2016-11-06 DIAGNOSIS — M25561 Pain in right knee: Secondary | ICD-10-CM | POA: Diagnosis not present

## 2016-11-10 DIAGNOSIS — Z79899 Other long term (current) drug therapy: Secondary | ICD-10-CM | POA: Diagnosis not present

## 2016-11-10 DIAGNOSIS — I10 Essential (primary) hypertension: Secondary | ICD-10-CM | POA: Diagnosis not present

## 2016-11-10 DIAGNOSIS — E7800 Pure hypercholesterolemia, unspecified: Secondary | ICD-10-CM | POA: Diagnosis not present

## 2016-11-12 DIAGNOSIS — L570 Actinic keratosis: Secondary | ICD-10-CM | POA: Diagnosis not present

## 2016-11-12 DIAGNOSIS — B0229 Other postherpetic nervous system involvement: Secondary | ICD-10-CM | POA: Diagnosis not present

## 2016-11-12 DIAGNOSIS — Z85828 Personal history of other malignant neoplasm of skin: Secondary | ICD-10-CM | POA: Diagnosis not present

## 2016-11-13 DIAGNOSIS — B0229 Other postherpetic nervous system involvement: Secondary | ICD-10-CM | POA: Diagnosis not present

## 2016-11-13 DIAGNOSIS — I10 Essential (primary) hypertension: Secondary | ICD-10-CM | POA: Diagnosis not present

## 2016-11-13 DIAGNOSIS — E781 Pure hyperglyceridemia: Secondary | ICD-10-CM | POA: Diagnosis not present

## 2016-11-19 DIAGNOSIS — Z23 Encounter for immunization: Secondary | ICD-10-CM | POA: Diagnosis not present

## 2016-11-27 DIAGNOSIS — N952 Postmenopausal atrophic vaginitis: Secondary | ICD-10-CM | POA: Diagnosis not present

## 2016-11-27 DIAGNOSIS — Z4689 Encounter for fitting and adjustment of other specified devices: Secondary | ICD-10-CM | POA: Diagnosis not present

## 2016-11-27 DIAGNOSIS — N8110 Cystocele, unspecified: Secondary | ICD-10-CM | POA: Diagnosis not present

## 2016-12-31 DIAGNOSIS — B0229 Other postherpetic nervous system involvement: Secondary | ICD-10-CM | POA: Diagnosis not present

## 2016-12-31 DIAGNOSIS — D485 Neoplasm of uncertain behavior of skin: Secondary | ICD-10-CM | POA: Diagnosis not present

## 2016-12-31 DIAGNOSIS — C44729 Squamous cell carcinoma of skin of left lower limb, including hip: Secondary | ICD-10-CM | POA: Diagnosis not present

## 2016-12-31 DIAGNOSIS — Z85828 Personal history of other malignant neoplasm of skin: Secondary | ICD-10-CM | POA: Diagnosis not present

## 2017-01-13 DIAGNOSIS — G8929 Other chronic pain: Secondary | ICD-10-CM | POA: Diagnosis not present

## 2017-01-13 DIAGNOSIS — M25561 Pain in right knee: Secondary | ICD-10-CM | POA: Diagnosis not present

## 2017-01-21 DIAGNOSIS — H04123 Dry eye syndrome of bilateral lacrimal glands: Secondary | ICD-10-CM | POA: Diagnosis not present

## 2017-01-21 DIAGNOSIS — H40013 Open angle with borderline findings, low risk, bilateral: Secondary | ICD-10-CM | POA: Diagnosis not present

## 2017-01-29 DIAGNOSIS — Z85828 Personal history of other malignant neoplasm of skin: Secondary | ICD-10-CM | POA: Diagnosis not present

## 2017-01-29 DIAGNOSIS — L570 Actinic keratosis: Secondary | ICD-10-CM | POA: Diagnosis not present

## 2017-01-29 DIAGNOSIS — C44729 Squamous cell carcinoma of skin of left lower limb, including hip: Secondary | ICD-10-CM | POA: Diagnosis not present

## 2017-02-26 DIAGNOSIS — N8110 Cystocele, unspecified: Secondary | ICD-10-CM | POA: Diagnosis not present

## 2017-02-26 DIAGNOSIS — N952 Postmenopausal atrophic vaginitis: Secondary | ICD-10-CM | POA: Diagnosis not present

## 2017-02-26 DIAGNOSIS — Z4689 Encounter for fitting and adjustment of other specified devices: Secondary | ICD-10-CM | POA: Diagnosis not present

## 2017-03-04 DIAGNOSIS — N611 Abscess of the breast and nipple: Secondary | ICD-10-CM | POA: Diagnosis not present

## 2017-03-04 DIAGNOSIS — Z85828 Personal history of other malignant neoplasm of skin: Secondary | ICD-10-CM | POA: Diagnosis not present

## 2017-03-10 DIAGNOSIS — E781 Pure hyperglyceridemia: Secondary | ICD-10-CM | POA: Diagnosis not present

## 2017-04-14 DIAGNOSIS — S0012XA Contusion of left eyelid and periocular area, initial encounter: Secondary | ICD-10-CM | POA: Diagnosis not present

## 2017-04-17 DIAGNOSIS — L821 Other seborrheic keratosis: Secondary | ICD-10-CM | POA: Diagnosis not present

## 2017-04-17 DIAGNOSIS — B0229 Other postherpetic nervous system involvement: Secondary | ICD-10-CM | POA: Diagnosis not present

## 2017-04-17 DIAGNOSIS — Z85828 Personal history of other malignant neoplasm of skin: Secondary | ICD-10-CM | POA: Diagnosis not present

## 2017-04-17 DIAGNOSIS — D1801 Hemangioma of skin and subcutaneous tissue: Secondary | ICD-10-CM | POA: Diagnosis not present

## 2017-04-17 DIAGNOSIS — D225 Melanocytic nevi of trunk: Secondary | ICD-10-CM | POA: Diagnosis not present

## 2017-04-17 DIAGNOSIS — L814 Other melanin hyperpigmentation: Secondary | ICD-10-CM | POA: Diagnosis not present

## 2017-04-17 DIAGNOSIS — D3617 Benign neoplasm of peripheral nerves and autonomic nervous system of trunk, unspecified: Secondary | ICD-10-CM | POA: Diagnosis not present

## 2017-04-17 DIAGNOSIS — L570 Actinic keratosis: Secondary | ICD-10-CM | POA: Diagnosis not present

## 2017-04-17 DIAGNOSIS — I788 Other diseases of capillaries: Secondary | ICD-10-CM | POA: Diagnosis not present

## 2017-04-17 DIAGNOSIS — D692 Other nonthrombocytopenic purpura: Secondary | ICD-10-CM | POA: Diagnosis not present

## 2017-04-17 DIAGNOSIS — L723 Sebaceous cyst: Secondary | ICD-10-CM | POA: Diagnosis not present

## 2017-05-26 DIAGNOSIS — M810 Age-related osteoporosis without current pathological fracture: Secondary | ICD-10-CM | POA: Diagnosis not present

## 2017-05-26 DIAGNOSIS — Z0000 Encounter for general adult medical examination without abnormal findings: Secondary | ICD-10-CM | POA: Diagnosis not present

## 2017-05-26 DIAGNOSIS — E7800 Pure hypercholesterolemia, unspecified: Secondary | ICD-10-CM | POA: Diagnosis not present

## 2017-05-26 DIAGNOSIS — Z79899 Other long term (current) drug therapy: Secondary | ICD-10-CM | POA: Diagnosis not present

## 2017-05-26 DIAGNOSIS — B0229 Other postherpetic nervous system involvement: Secondary | ICD-10-CM | POA: Diagnosis not present

## 2017-05-26 DIAGNOSIS — I10 Essential (primary) hypertension: Secondary | ICD-10-CM | POA: Diagnosis not present

## 2017-05-26 DIAGNOSIS — Z1389 Encounter for screening for other disorder: Secondary | ICD-10-CM | POA: Diagnosis not present

## 2017-05-28 DIAGNOSIS — N8110 Cystocele, unspecified: Secondary | ICD-10-CM | POA: Diagnosis not present

## 2017-05-28 DIAGNOSIS — N952 Postmenopausal atrophic vaginitis: Secondary | ICD-10-CM | POA: Diagnosis not present

## 2017-05-28 DIAGNOSIS — Z4689 Encounter for fitting and adjustment of other specified devices: Secondary | ICD-10-CM | POA: Diagnosis not present

## 2017-06-22 DIAGNOSIS — H40013 Open angle with borderline findings, low risk, bilateral: Secondary | ICD-10-CM | POA: Diagnosis not present

## 2017-06-22 DIAGNOSIS — H26491 Other secondary cataract, right eye: Secondary | ICD-10-CM | POA: Diagnosis not present

## 2017-06-22 DIAGNOSIS — B0229 Other postherpetic nervous system involvement: Secondary | ICD-10-CM | POA: Diagnosis not present

## 2017-06-22 DIAGNOSIS — H353133 Nonexudative age-related macular degeneration, bilateral, advanced atrophic without subfoveal involvement: Secondary | ICD-10-CM | POA: Diagnosis not present

## 2017-07-24 DIAGNOSIS — I10 Essential (primary) hypertension: Secondary | ICD-10-CM | POA: Diagnosis not present

## 2017-07-24 DIAGNOSIS — B0229 Other postherpetic nervous system involvement: Secondary | ICD-10-CM | POA: Diagnosis not present

## 2017-08-12 DIAGNOSIS — I10 Essential (primary) hypertension: Secondary | ICD-10-CM | POA: Diagnosis not present

## 2017-08-12 DIAGNOSIS — B0229 Other postherpetic nervous system involvement: Secondary | ICD-10-CM | POA: Diagnosis not present

## 2017-08-13 DIAGNOSIS — B0229 Other postherpetic nervous system involvement: Secondary | ICD-10-CM | POA: Diagnosis not present

## 2017-08-17 DIAGNOSIS — N8110 Cystocele, unspecified: Secondary | ICD-10-CM | POA: Diagnosis not present

## 2017-08-17 DIAGNOSIS — N952 Postmenopausal atrophic vaginitis: Secondary | ICD-10-CM | POA: Diagnosis not present

## 2017-08-17 DIAGNOSIS — Z4689 Encounter for fitting and adjustment of other specified devices: Secondary | ICD-10-CM | POA: Diagnosis not present

## 2017-09-23 DIAGNOSIS — I10 Essential (primary) hypertension: Secondary | ICD-10-CM | POA: Diagnosis not present

## 2017-09-23 DIAGNOSIS — Z79899 Other long term (current) drug therapy: Secondary | ICD-10-CM | POA: Diagnosis not present

## 2017-09-23 DIAGNOSIS — B0229 Other postherpetic nervous system involvement: Secondary | ICD-10-CM | POA: Diagnosis not present

## 2017-09-30 DIAGNOSIS — C44721 Squamous cell carcinoma of skin of unspecified lower limb, including hip: Secondary | ICD-10-CM | POA: Diagnosis not present

## 2017-09-30 DIAGNOSIS — D485 Neoplasm of uncertain behavior of skin: Secondary | ICD-10-CM | POA: Diagnosis not present

## 2017-09-30 DIAGNOSIS — C44722 Squamous cell carcinoma of skin of right lower limb, including hip: Secondary | ICD-10-CM | POA: Diagnosis not present

## 2017-09-30 DIAGNOSIS — C44622 Squamous cell carcinoma of skin of right upper limb, including shoulder: Secondary | ICD-10-CM | POA: Diagnosis not present

## 2017-09-30 DIAGNOSIS — Z85828 Personal history of other malignant neoplasm of skin: Secondary | ICD-10-CM | POA: Diagnosis not present

## 2017-10-02 DIAGNOSIS — K5901 Slow transit constipation: Secondary | ICD-10-CM | POA: Diagnosis not present

## 2017-10-02 DIAGNOSIS — I10 Essential (primary) hypertension: Secondary | ICD-10-CM | POA: Diagnosis not present

## 2017-10-02 DIAGNOSIS — B0229 Other postherpetic nervous system involvement: Secondary | ICD-10-CM | POA: Diagnosis not present

## 2017-11-20 DIAGNOSIS — N952 Postmenopausal atrophic vaginitis: Secondary | ICD-10-CM | POA: Diagnosis not present

## 2017-11-20 DIAGNOSIS — Z4689 Encounter for fitting and adjustment of other specified devices: Secondary | ICD-10-CM | POA: Diagnosis not present

## 2017-11-20 DIAGNOSIS — N8110 Cystocele, unspecified: Secondary | ICD-10-CM | POA: Diagnosis not present

## 2017-11-20 DIAGNOSIS — Z23 Encounter for immunization: Secondary | ICD-10-CM | POA: Diagnosis not present

## 2017-11-26 DIAGNOSIS — H00024 Hordeolum internum left upper eyelid: Secondary | ICD-10-CM | POA: Diagnosis not present

## 2017-12-02 DIAGNOSIS — B0229 Other postherpetic nervous system involvement: Secondary | ICD-10-CM | POA: Diagnosis not present

## 2017-12-04 DIAGNOSIS — B0229 Other postherpetic nervous system involvement: Secondary | ICD-10-CM | POA: Diagnosis not present

## 2017-12-04 DIAGNOSIS — H00024 Hordeolum internum left upper eyelid: Secondary | ICD-10-CM | POA: Diagnosis not present

## 2017-12-23 DIAGNOSIS — I10 Essential (primary) hypertension: Secondary | ICD-10-CM | POA: Diagnosis not present

## 2017-12-23 DIAGNOSIS — B0229 Other postherpetic nervous system involvement: Secondary | ICD-10-CM | POA: Diagnosis not present

## 2017-12-23 DIAGNOSIS — K5901 Slow transit constipation: Secondary | ICD-10-CM | POA: Diagnosis not present

## 2018-01-12 DIAGNOSIS — I10 Essential (primary) hypertension: Secondary | ICD-10-CM | POA: Diagnosis not present

## 2018-01-12 DIAGNOSIS — Z79899 Other long term (current) drug therapy: Secondary | ICD-10-CM | POA: Diagnosis not present

## 2018-01-20 DIAGNOSIS — D224 Melanocytic nevi of scalp and neck: Secondary | ICD-10-CM | POA: Diagnosis not present

## 2018-01-20 DIAGNOSIS — L820 Inflamed seborrheic keratosis: Secondary | ICD-10-CM | POA: Diagnosis not present

## 2018-01-20 DIAGNOSIS — L570 Actinic keratosis: Secondary | ICD-10-CM | POA: Diagnosis not present

## 2018-01-20 DIAGNOSIS — Z85828 Personal history of other malignant neoplasm of skin: Secondary | ICD-10-CM | POA: Diagnosis not present

## 2018-01-25 DIAGNOSIS — H02056 Trichiasis without entropian left eye, unspecified eyelid: Secondary | ICD-10-CM | POA: Diagnosis not present

## 2018-01-25 DIAGNOSIS — H26491 Other secondary cataract, right eye: Secondary | ICD-10-CM | POA: Diagnosis not present

## 2018-01-25 DIAGNOSIS — H353211 Exudative age-related macular degeneration, right eye, with active choroidal neovascularization: Secondary | ICD-10-CM | POA: Diagnosis not present

## 2018-02-03 ENCOUNTER — Encounter (INDEPENDENT_AMBULATORY_CARE_PROVIDER_SITE_OTHER): Payer: Medicare Other | Admitting: Ophthalmology

## 2018-02-03 DIAGNOSIS — H43813 Vitreous degeneration, bilateral: Secondary | ICD-10-CM | POA: Diagnosis not present

## 2018-02-03 DIAGNOSIS — I10 Essential (primary) hypertension: Secondary | ICD-10-CM

## 2018-02-03 DIAGNOSIS — H353211 Exudative age-related macular degeneration, right eye, with active choroidal neovascularization: Secondary | ICD-10-CM | POA: Diagnosis not present

## 2018-02-03 DIAGNOSIS — H353122 Nonexudative age-related macular degeneration, left eye, intermediate dry stage: Secondary | ICD-10-CM | POA: Diagnosis not present

## 2018-02-03 DIAGNOSIS — H35033 Hypertensive retinopathy, bilateral: Secondary | ICD-10-CM | POA: Diagnosis not present

## 2018-02-18 DIAGNOSIS — H40013 Open angle with borderline findings, low risk, bilateral: Secondary | ICD-10-CM | POA: Diagnosis not present

## 2018-02-18 DIAGNOSIS — H1851 Endothelial corneal dystrophy: Secondary | ICD-10-CM | POA: Diagnosis not present

## 2018-02-18 DIAGNOSIS — B0059 Other herpesviral disease of eye: Secondary | ICD-10-CM | POA: Diagnosis not present

## 2018-02-18 DIAGNOSIS — B0229 Other postherpetic nervous system involvement: Secondary | ICD-10-CM | POA: Diagnosis not present

## 2018-02-24 DIAGNOSIS — Z4689 Encounter for fitting and adjustment of other specified devices: Secondary | ICD-10-CM | POA: Diagnosis not present

## 2018-02-24 DIAGNOSIS — N952 Postmenopausal atrophic vaginitis: Secondary | ICD-10-CM | POA: Diagnosis not present

## 2018-02-24 DIAGNOSIS — N8110 Cystocele, unspecified: Secondary | ICD-10-CM | POA: Diagnosis not present

## 2018-02-25 DIAGNOSIS — Z85828 Personal history of other malignant neoplasm of skin: Secondary | ICD-10-CM | POA: Diagnosis not present

## 2018-02-25 DIAGNOSIS — D485 Neoplasm of uncertain behavior of skin: Secondary | ICD-10-CM | POA: Diagnosis not present

## 2018-02-25 DIAGNOSIS — L821 Other seborrheic keratosis: Secondary | ICD-10-CM | POA: Diagnosis not present

## 2018-02-25 DIAGNOSIS — L820 Inflamed seborrheic keratosis: Secondary | ICD-10-CM | POA: Diagnosis not present

## 2018-02-25 DIAGNOSIS — L570 Actinic keratosis: Secondary | ICD-10-CM | POA: Diagnosis not present

## 2018-02-25 DIAGNOSIS — C44729 Squamous cell carcinoma of skin of left lower limb, including hip: Secondary | ICD-10-CM | POA: Diagnosis not present

## 2018-03-03 ENCOUNTER — Encounter (INDEPENDENT_AMBULATORY_CARE_PROVIDER_SITE_OTHER): Payer: Medicare Other | Admitting: Ophthalmology

## 2018-03-03 DIAGNOSIS — H353122 Nonexudative age-related macular degeneration, left eye, intermediate dry stage: Secondary | ICD-10-CM

## 2018-03-03 DIAGNOSIS — I10 Essential (primary) hypertension: Secondary | ICD-10-CM | POA: Diagnosis not present

## 2018-03-03 DIAGNOSIS — H35033 Hypertensive retinopathy, bilateral: Secondary | ICD-10-CM

## 2018-03-03 DIAGNOSIS — H353221 Exudative age-related macular degeneration, left eye, with active choroidal neovascularization: Secondary | ICD-10-CM

## 2018-03-03 DIAGNOSIS — H43813 Vitreous degeneration, bilateral: Secondary | ICD-10-CM | POA: Diagnosis not present

## 2018-04-02 DIAGNOSIS — N8110 Cystocele, unspecified: Secondary | ICD-10-CM | POA: Diagnosis not present

## 2018-04-02 DIAGNOSIS — Z4689 Encounter for fitting and adjustment of other specified devices: Secondary | ICD-10-CM | POA: Diagnosis not present

## 2018-04-02 DIAGNOSIS — N952 Postmenopausal atrophic vaginitis: Secondary | ICD-10-CM | POA: Diagnosis not present

## 2018-04-05 ENCOUNTER — Encounter (INDEPENDENT_AMBULATORY_CARE_PROVIDER_SITE_OTHER): Payer: Medicare Other | Admitting: Ophthalmology

## 2018-04-05 DIAGNOSIS — H353122 Nonexudative age-related macular degeneration, left eye, intermediate dry stage: Secondary | ICD-10-CM | POA: Diagnosis not present

## 2018-04-05 DIAGNOSIS — H353211 Exudative age-related macular degeneration, right eye, with active choroidal neovascularization: Secondary | ICD-10-CM

## 2018-04-05 DIAGNOSIS — H35033 Hypertensive retinopathy, bilateral: Secondary | ICD-10-CM

## 2018-04-05 DIAGNOSIS — H43813 Vitreous degeneration, bilateral: Secondary | ICD-10-CM | POA: Diagnosis not present

## 2018-04-05 DIAGNOSIS — I10 Essential (primary) hypertension: Secondary | ICD-10-CM | POA: Diagnosis not present

## 2018-04-19 DIAGNOSIS — I10 Essential (primary) hypertension: Secondary | ICD-10-CM | POA: Diagnosis not present

## 2018-04-19 DIAGNOSIS — B0229 Other postherpetic nervous system involvement: Secondary | ICD-10-CM | POA: Diagnosis not present

## 2018-05-03 ENCOUNTER — Encounter (INDEPENDENT_AMBULATORY_CARE_PROVIDER_SITE_OTHER): Payer: Medicare Other | Admitting: Ophthalmology

## 2018-05-03 DIAGNOSIS — W19XXXA Unspecified fall, initial encounter: Secondary | ICD-10-CM | POA: Diagnosis not present

## 2018-05-03 DIAGNOSIS — S52501A Unspecified fracture of the lower end of right radius, initial encounter for closed fracture: Secondary | ICD-10-CM | POA: Diagnosis not present

## 2018-05-03 DIAGNOSIS — M25531 Pain in right wrist: Secondary | ICD-10-CM | POA: Diagnosis not present

## 2018-05-03 DIAGNOSIS — S0083XA Contusion of other part of head, initial encounter: Secondary | ICD-10-CM | POA: Diagnosis not present

## 2018-05-03 DIAGNOSIS — S52502A Unspecified fracture of the lower end of left radius, initial encounter for closed fracture: Secondary | ICD-10-CM | POA: Diagnosis not present

## 2018-05-07 DIAGNOSIS — M79641 Pain in right hand: Secondary | ICD-10-CM | POA: Diagnosis not present

## 2018-05-07 DIAGNOSIS — M25531 Pain in right wrist: Secondary | ICD-10-CM | POA: Diagnosis not present

## 2018-05-07 DIAGNOSIS — M25532 Pain in left wrist: Secondary | ICD-10-CM | POA: Diagnosis not present

## 2018-05-07 DIAGNOSIS — M79642 Pain in left hand: Secondary | ICD-10-CM | POA: Diagnosis not present

## 2018-06-03 ENCOUNTER — Encounter (INDEPENDENT_AMBULATORY_CARE_PROVIDER_SITE_OTHER): Payer: Medicare Other | Admitting: Ophthalmology

## 2018-06-18 DIAGNOSIS — S52532D Colles' fracture of left radius, subsequent encounter for closed fracture with routine healing: Secondary | ICD-10-CM | POA: Diagnosis not present

## 2018-06-18 DIAGNOSIS — S52521D Torus fracture of lower end of right radius, subsequent encounter for fracture with routine healing: Secondary | ICD-10-CM | POA: Diagnosis not present

## 2018-06-18 DIAGNOSIS — M25532 Pain in left wrist: Secondary | ICD-10-CM | POA: Diagnosis not present

## 2018-06-18 DIAGNOSIS — M25531 Pain in right wrist: Secondary | ICD-10-CM | POA: Diagnosis not present

## 2018-07-13 ENCOUNTER — Encounter (INDEPENDENT_AMBULATORY_CARE_PROVIDER_SITE_OTHER): Payer: Medicare Other | Admitting: Ophthalmology

## 2018-08-11 ENCOUNTER — Other Ambulatory Visit: Payer: Self-pay

## 2018-08-11 ENCOUNTER — Encounter (INDEPENDENT_AMBULATORY_CARE_PROVIDER_SITE_OTHER): Payer: Medicare Other | Admitting: Ophthalmology

## 2018-08-11 DIAGNOSIS — H353211 Exudative age-related macular degeneration, right eye, with active choroidal neovascularization: Secondary | ICD-10-CM

## 2018-08-11 DIAGNOSIS — H43813 Vitreous degeneration, bilateral: Secondary | ICD-10-CM | POA: Diagnosis not present

## 2018-08-11 DIAGNOSIS — I10 Essential (primary) hypertension: Secondary | ICD-10-CM

## 2018-08-11 DIAGNOSIS — H35033 Hypertensive retinopathy, bilateral: Secondary | ICD-10-CM

## 2018-08-11 DIAGNOSIS — H353122 Nonexudative age-related macular degeneration, left eye, intermediate dry stage: Secondary | ICD-10-CM | POA: Diagnosis not present

## 2018-08-16 DIAGNOSIS — H353211 Exudative age-related macular degeneration, right eye, with active choroidal neovascularization: Secondary | ICD-10-CM | POA: Diagnosis not present

## 2018-08-16 DIAGNOSIS — H353123 Nonexudative age-related macular degeneration, left eye, advanced atrophic without subfoveal involvement: Secondary | ICD-10-CM | POA: Diagnosis not present

## 2018-08-16 DIAGNOSIS — H26491 Other secondary cataract, right eye: Secondary | ICD-10-CM | POA: Diagnosis not present

## 2018-08-16 DIAGNOSIS — H35033 Hypertensive retinopathy, bilateral: Secondary | ICD-10-CM | POA: Diagnosis not present

## 2018-08-18 DIAGNOSIS — N952 Postmenopausal atrophic vaginitis: Secondary | ICD-10-CM | POA: Diagnosis not present

## 2018-08-18 DIAGNOSIS — Z4689 Encounter for fitting and adjustment of other specified devices: Secondary | ICD-10-CM | POA: Diagnosis not present

## 2018-08-18 DIAGNOSIS — N8110 Cystocele, unspecified: Secondary | ICD-10-CM | POA: Diagnosis not present

## 2018-09-08 ENCOUNTER — Encounter (INDEPENDENT_AMBULATORY_CARE_PROVIDER_SITE_OTHER): Payer: Medicare Other | Admitting: Ophthalmology

## 2018-10-06 DIAGNOSIS — L820 Inflamed seborrheic keratosis: Secondary | ICD-10-CM | POA: Diagnosis not present

## 2018-10-06 DIAGNOSIS — Z85828 Personal history of other malignant neoplasm of skin: Secondary | ICD-10-CM | POA: Diagnosis not present

## 2018-10-06 DIAGNOSIS — L821 Other seborrheic keratosis: Secondary | ICD-10-CM | POA: Diagnosis not present

## 2018-10-06 DIAGNOSIS — L300 Nummular dermatitis: Secondary | ICD-10-CM | POA: Diagnosis not present

## 2018-10-06 DIAGNOSIS — L570 Actinic keratosis: Secondary | ICD-10-CM | POA: Diagnosis not present

## 2018-10-13 ENCOUNTER — Other Ambulatory Visit: Payer: Self-pay

## 2018-10-13 ENCOUNTER — Encounter (INDEPENDENT_AMBULATORY_CARE_PROVIDER_SITE_OTHER): Payer: Medicare Other | Admitting: Ophthalmology

## 2018-10-13 DIAGNOSIS — H353124 Nonexudative age-related macular degeneration, left eye, advanced atrophic with subfoveal involvement: Secondary | ICD-10-CM

## 2018-10-13 DIAGNOSIS — H353211 Exudative age-related macular degeneration, right eye, with active choroidal neovascularization: Secondary | ICD-10-CM | POA: Diagnosis not present

## 2018-10-13 DIAGNOSIS — I10 Essential (primary) hypertension: Secondary | ICD-10-CM

## 2018-10-13 DIAGNOSIS — H35033 Hypertensive retinopathy, bilateral: Secondary | ICD-10-CM | POA: Diagnosis not present

## 2018-10-13 DIAGNOSIS — H43813 Vitreous degeneration, bilateral: Secondary | ICD-10-CM

## 2018-11-10 ENCOUNTER — Encounter (INDEPENDENT_AMBULATORY_CARE_PROVIDER_SITE_OTHER): Payer: Medicare Other | Admitting: Ophthalmology

## 2018-11-10 ENCOUNTER — Other Ambulatory Visit: Payer: Self-pay

## 2018-11-10 DIAGNOSIS — H35033 Hypertensive retinopathy, bilateral: Secondary | ICD-10-CM | POA: Diagnosis not present

## 2018-11-10 DIAGNOSIS — I10 Essential (primary) hypertension: Secondary | ICD-10-CM

## 2018-11-10 DIAGNOSIS — H353211 Exudative age-related macular degeneration, right eye, with active choroidal neovascularization: Secondary | ICD-10-CM | POA: Diagnosis not present

## 2018-11-10 DIAGNOSIS — H353122 Nonexudative age-related macular degeneration, left eye, intermediate dry stage: Secondary | ICD-10-CM | POA: Diagnosis not present

## 2018-11-10 DIAGNOSIS — H43813 Vitreous degeneration, bilateral: Secondary | ICD-10-CM | POA: Diagnosis not present

## 2018-11-18 DIAGNOSIS — Z85828 Personal history of other malignant neoplasm of skin: Secondary | ICD-10-CM | POA: Diagnosis not present

## 2018-11-18 DIAGNOSIS — D485 Neoplasm of uncertain behavior of skin: Secondary | ICD-10-CM | POA: Diagnosis not present

## 2018-11-18 DIAGNOSIS — L905 Scar conditions and fibrosis of skin: Secondary | ICD-10-CM | POA: Diagnosis not present

## 2018-11-18 DIAGNOSIS — L820 Inflamed seborrheic keratosis: Secondary | ICD-10-CM | POA: Diagnosis not present

## 2018-11-18 DIAGNOSIS — D0462 Carcinoma in situ of skin of left upper limb, including shoulder: Secondary | ICD-10-CM | POA: Diagnosis not present

## 2018-11-22 DIAGNOSIS — B0229 Other postherpetic nervous system involvement: Secondary | ICD-10-CM | POA: Diagnosis not present

## 2018-11-22 DIAGNOSIS — I10 Essential (primary) hypertension: Secondary | ICD-10-CM | POA: Diagnosis not present

## 2018-11-22 DIAGNOSIS — Z79899 Other long term (current) drug therapy: Secondary | ICD-10-CM | POA: Diagnosis not present

## 2018-11-22 DIAGNOSIS — E7800 Pure hypercholesterolemia, unspecified: Secondary | ICD-10-CM | POA: Diagnosis not present

## 2018-11-24 DIAGNOSIS — E7800 Pure hypercholesterolemia, unspecified: Secondary | ICD-10-CM | POA: Diagnosis not present

## 2018-11-24 DIAGNOSIS — Z79899 Other long term (current) drug therapy: Secondary | ICD-10-CM | POA: Diagnosis not present

## 2018-11-24 DIAGNOSIS — Z23 Encounter for immunization: Secondary | ICD-10-CM | POA: Diagnosis not present

## 2018-11-24 DIAGNOSIS — N8110 Cystocele, unspecified: Secondary | ICD-10-CM | POA: Diagnosis not present

## 2018-11-24 DIAGNOSIS — N952 Postmenopausal atrophic vaginitis: Secondary | ICD-10-CM | POA: Diagnosis not present

## 2018-11-24 DIAGNOSIS — Z4689 Encounter for fitting and adjustment of other specified devices: Secondary | ICD-10-CM | POA: Diagnosis not present

## 2018-11-24 DIAGNOSIS — I10 Essential (primary) hypertension: Secondary | ICD-10-CM | POA: Diagnosis not present

## 2018-12-08 ENCOUNTER — Encounter (INDEPENDENT_AMBULATORY_CARE_PROVIDER_SITE_OTHER): Payer: Medicare Other | Admitting: Ophthalmology

## 2018-12-08 DIAGNOSIS — H35033 Hypertensive retinopathy, bilateral: Secondary | ICD-10-CM | POA: Diagnosis not present

## 2018-12-08 DIAGNOSIS — I10 Essential (primary) hypertension: Secondary | ICD-10-CM

## 2018-12-08 DIAGNOSIS — H353122 Nonexudative age-related macular degeneration, left eye, intermediate dry stage: Secondary | ICD-10-CM | POA: Diagnosis not present

## 2018-12-08 DIAGNOSIS — H43813 Vitreous degeneration, bilateral: Secondary | ICD-10-CM | POA: Diagnosis not present

## 2018-12-08 DIAGNOSIS — H353211 Exudative age-related macular degeneration, right eye, with active choroidal neovascularization: Secondary | ICD-10-CM

## 2019-01-05 ENCOUNTER — Encounter (INDEPENDENT_AMBULATORY_CARE_PROVIDER_SITE_OTHER): Payer: Medicare Other | Admitting: Ophthalmology

## 2019-01-05 DIAGNOSIS — I10 Essential (primary) hypertension: Secondary | ICD-10-CM

## 2019-01-05 DIAGNOSIS — H353211 Exudative age-related macular degeneration, right eye, with active choroidal neovascularization: Secondary | ICD-10-CM | POA: Diagnosis not present

## 2019-01-05 DIAGNOSIS — H35033 Hypertensive retinopathy, bilateral: Secondary | ICD-10-CM

## 2019-01-05 DIAGNOSIS — H43813 Vitreous degeneration, bilateral: Secondary | ICD-10-CM | POA: Diagnosis not present

## 2019-01-05 DIAGNOSIS — H353122 Nonexudative age-related macular degeneration, left eye, intermediate dry stage: Secondary | ICD-10-CM | POA: Diagnosis not present

## 2019-02-02 ENCOUNTER — Encounter (INDEPENDENT_AMBULATORY_CARE_PROVIDER_SITE_OTHER): Payer: Medicare Other | Admitting: Ophthalmology

## 2019-02-02 ENCOUNTER — Other Ambulatory Visit: Payer: Self-pay

## 2019-02-02 DIAGNOSIS — H35033 Hypertensive retinopathy, bilateral: Secondary | ICD-10-CM | POA: Diagnosis not present

## 2019-02-02 DIAGNOSIS — H353211 Exudative age-related macular degeneration, right eye, with active choroidal neovascularization: Secondary | ICD-10-CM

## 2019-02-02 DIAGNOSIS — I10 Essential (primary) hypertension: Secondary | ICD-10-CM | POA: Diagnosis not present

## 2019-02-02 DIAGNOSIS — H353122 Nonexudative age-related macular degeneration, left eye, intermediate dry stage: Secondary | ICD-10-CM | POA: Diagnosis not present

## 2019-02-02 DIAGNOSIS — H43813 Vitreous degeneration, bilateral: Secondary | ICD-10-CM

## 2019-03-02 ENCOUNTER — Other Ambulatory Visit: Payer: Self-pay | Admitting: Geriatric Medicine

## 2019-03-02 ENCOUNTER — Other Ambulatory Visit: Payer: Self-pay

## 2019-03-02 ENCOUNTER — Ambulatory Visit
Admission: RE | Admit: 2019-03-02 | Discharge: 2019-03-02 | Disposition: A | Discharge status: Home / Self Care | Payer: Medicare Other | Source: Ambulatory Visit | Attending: Geriatric Medicine | Admitting: Geriatric Medicine

## 2019-03-02 DIAGNOSIS — R1031 Right lower quadrant pain: Secondary | ICD-10-CM | POA: Diagnosis not present

## 2019-03-02 DIAGNOSIS — I10 Essential (primary) hypertension: Secondary | ICD-10-CM | POA: Diagnosis not present

## 2019-03-10 ENCOUNTER — Encounter (INDEPENDENT_AMBULATORY_CARE_PROVIDER_SITE_OTHER): Payer: Medicare Other | Admitting: Ophthalmology

## 2019-03-11 ENCOUNTER — Ambulatory Visit: Payer: Medicare Other

## 2019-03-30 ENCOUNTER — Encounter (INDEPENDENT_AMBULATORY_CARE_PROVIDER_SITE_OTHER): Payer: Medicare Other | Admitting: Ophthalmology

## 2019-04-21 DIAGNOSIS — Z4689 Encounter for fitting and adjustment of other specified devices: Secondary | ICD-10-CM | POA: Diagnosis not present

## 2019-04-21 DIAGNOSIS — T8389XA Other specified complication of genitourinary prosthetic devices, implants and grafts, initial encounter: Secondary | ICD-10-CM | POA: Diagnosis not present

## 2019-04-21 DIAGNOSIS — N8110 Cystocele, unspecified: Secondary | ICD-10-CM | POA: Diagnosis not present

## 2019-04-21 DIAGNOSIS — N898 Other specified noninflammatory disorders of vagina: Secondary | ICD-10-CM | POA: Diagnosis not present

## 2019-04-22 DIAGNOSIS — R300 Dysuria: Secondary | ICD-10-CM | POA: Diagnosis not present

## 2019-04-26 DIAGNOSIS — N952 Postmenopausal atrophic vaginitis: Secondary | ICD-10-CM | POA: Diagnosis not present

## 2019-04-26 DIAGNOSIS — T8389XA Other specified complication of genitourinary prosthetic devices, implants and grafts, initial encounter: Secondary | ICD-10-CM | POA: Diagnosis not present

## 2019-04-26 DIAGNOSIS — N8110 Cystocele, unspecified: Secondary | ICD-10-CM | POA: Diagnosis not present

## 2019-04-26 DIAGNOSIS — Z4689 Encounter for fitting and adjustment of other specified devices: Secondary | ICD-10-CM | POA: Diagnosis not present

## 2019-04-27 ENCOUNTER — Encounter (INDEPENDENT_AMBULATORY_CARE_PROVIDER_SITE_OTHER): Payer: Medicare Other | Admitting: Ophthalmology

## 2019-04-27 DIAGNOSIS — H43813 Vitreous degeneration, bilateral: Secondary | ICD-10-CM

## 2019-04-27 DIAGNOSIS — H353122 Nonexudative age-related macular degeneration, left eye, intermediate dry stage: Secondary | ICD-10-CM

## 2019-04-27 DIAGNOSIS — H353211 Exudative age-related macular degeneration, right eye, with active choroidal neovascularization: Secondary | ICD-10-CM | POA: Diagnosis not present

## 2019-04-27 DIAGNOSIS — I10 Essential (primary) hypertension: Secondary | ICD-10-CM | POA: Diagnosis not present

## 2019-04-27 DIAGNOSIS — H35033 Hypertensive retinopathy, bilateral: Secondary | ICD-10-CM

## 2019-05-04 DIAGNOSIS — Z4689 Encounter for fitting and adjustment of other specified devices: Secondary | ICD-10-CM | POA: Diagnosis not present

## 2019-05-04 DIAGNOSIS — N8110 Cystocele, unspecified: Secondary | ICD-10-CM | POA: Diagnosis not present

## 2019-05-04 DIAGNOSIS — N952 Postmenopausal atrophic vaginitis: Secondary | ICD-10-CM | POA: Diagnosis not present

## 2019-05-25 DIAGNOSIS — D225 Melanocytic nevi of trunk: Secondary | ICD-10-CM | POA: Diagnosis not present

## 2019-05-25 DIAGNOSIS — L814 Other melanin hyperpigmentation: Secondary | ICD-10-CM | POA: Diagnosis not present

## 2019-05-25 DIAGNOSIS — Z85828 Personal history of other malignant neoplasm of skin: Secondary | ICD-10-CM | POA: Diagnosis not present

## 2019-05-25 DIAGNOSIS — L821 Other seborrheic keratosis: Secondary | ICD-10-CM | POA: Diagnosis not present

## 2019-05-25 DIAGNOSIS — D1801 Hemangioma of skin and subcutaneous tissue: Secondary | ICD-10-CM | POA: Diagnosis not present

## 2019-05-25 DIAGNOSIS — D2272 Melanocytic nevi of left lower limb, including hip: Secondary | ICD-10-CM | POA: Diagnosis not present

## 2019-05-25 DIAGNOSIS — L218 Other seborrheic dermatitis: Secondary | ICD-10-CM | POA: Diagnosis not present

## 2019-05-25 DIAGNOSIS — D692 Other nonthrombocytopenic purpura: Secondary | ICD-10-CM | POA: Diagnosis not present

## 2019-05-25 DIAGNOSIS — L570 Actinic keratosis: Secondary | ICD-10-CM | POA: Diagnosis not present

## 2019-06-13 DIAGNOSIS — K219 Gastro-esophageal reflux disease without esophagitis: Secondary | ICD-10-CM | POA: Diagnosis not present

## 2019-06-13 DIAGNOSIS — Z1389 Encounter for screening for other disorder: Secondary | ICD-10-CM | POA: Diagnosis not present

## 2019-06-13 DIAGNOSIS — B0229 Other postherpetic nervous system involvement: Secondary | ICD-10-CM | POA: Diagnosis not present

## 2019-06-13 DIAGNOSIS — K9089 Other intestinal malabsorption: Secondary | ICD-10-CM | POA: Diagnosis not present

## 2019-06-13 DIAGNOSIS — Z79899 Other long term (current) drug therapy: Secondary | ICD-10-CM | POA: Diagnosis not present

## 2019-06-13 DIAGNOSIS — I10 Essential (primary) hypertension: Secondary | ICD-10-CM | POA: Diagnosis not present

## 2019-06-13 DIAGNOSIS — Z0000 Encounter for general adult medical examination without abnormal findings: Secondary | ICD-10-CM | POA: Diagnosis not present

## 2019-06-15 ENCOUNTER — Encounter (INDEPENDENT_AMBULATORY_CARE_PROVIDER_SITE_OTHER): Payer: Medicare Other | Admitting: Ophthalmology

## 2019-06-15 DIAGNOSIS — H353231 Exudative age-related macular degeneration, bilateral, with active choroidal neovascularization: Secondary | ICD-10-CM | POA: Diagnosis not present

## 2019-06-15 DIAGNOSIS — I10 Essential (primary) hypertension: Secondary | ICD-10-CM

## 2019-06-15 DIAGNOSIS — H43813 Vitreous degeneration, bilateral: Secondary | ICD-10-CM | POA: Diagnosis not present

## 2019-06-15 DIAGNOSIS — H35033 Hypertensive retinopathy, bilateral: Secondary | ICD-10-CM

## 2019-07-13 ENCOUNTER — Encounter (INDEPENDENT_AMBULATORY_CARE_PROVIDER_SITE_OTHER): Payer: Medicare Other | Admitting: Ophthalmology

## 2019-07-13 ENCOUNTER — Other Ambulatory Visit: Payer: Self-pay

## 2019-07-13 DIAGNOSIS — I10 Essential (primary) hypertension: Secondary | ICD-10-CM | POA: Diagnosis not present

## 2019-07-13 DIAGNOSIS — H35033 Hypertensive retinopathy, bilateral: Secondary | ICD-10-CM

## 2019-07-13 DIAGNOSIS — H43813 Vitreous degeneration, bilateral: Secondary | ICD-10-CM | POA: Diagnosis not present

## 2019-07-13 DIAGNOSIS — H353231 Exudative age-related macular degeneration, bilateral, with active choroidal neovascularization: Secondary | ICD-10-CM | POA: Diagnosis not present

## 2019-08-10 ENCOUNTER — Other Ambulatory Visit: Payer: Self-pay

## 2019-08-10 ENCOUNTER — Encounter (INDEPENDENT_AMBULATORY_CARE_PROVIDER_SITE_OTHER): Payer: Medicare Other | Admitting: Ophthalmology

## 2019-08-10 DIAGNOSIS — H35033 Hypertensive retinopathy, bilateral: Secondary | ICD-10-CM

## 2019-08-10 DIAGNOSIS — H43813 Vitreous degeneration, bilateral: Secondary | ICD-10-CM | POA: Diagnosis not present

## 2019-08-10 DIAGNOSIS — I10 Essential (primary) hypertension: Secondary | ICD-10-CM

## 2019-08-10 DIAGNOSIS — H353231 Exudative age-related macular degeneration, bilateral, with active choroidal neovascularization: Secondary | ICD-10-CM | POA: Diagnosis not present

## 2019-09-15 ENCOUNTER — Other Ambulatory Visit: Payer: Self-pay

## 2019-09-15 ENCOUNTER — Encounter (INDEPENDENT_AMBULATORY_CARE_PROVIDER_SITE_OTHER): Payer: Medicare Other | Admitting: Ophthalmology

## 2019-09-15 DIAGNOSIS — H43813 Vitreous degeneration, bilateral: Secondary | ICD-10-CM | POA: Diagnosis not present

## 2019-09-15 DIAGNOSIS — H353231 Exudative age-related macular degeneration, bilateral, with active choroidal neovascularization: Secondary | ICD-10-CM

## 2019-09-15 DIAGNOSIS — H35033 Hypertensive retinopathy, bilateral: Secondary | ICD-10-CM

## 2019-09-15 DIAGNOSIS — I10 Essential (primary) hypertension: Secondary | ICD-10-CM

## 2019-10-25 ENCOUNTER — Encounter (INDEPENDENT_AMBULATORY_CARE_PROVIDER_SITE_OTHER): Payer: Medicare Other | Admitting: Ophthalmology

## 2019-10-25 ENCOUNTER — Other Ambulatory Visit: Payer: Self-pay

## 2019-10-25 DIAGNOSIS — H43813 Vitreous degeneration, bilateral: Secondary | ICD-10-CM

## 2019-10-25 DIAGNOSIS — H35033 Hypertensive retinopathy, bilateral: Secondary | ICD-10-CM | POA: Diagnosis not present

## 2019-10-25 DIAGNOSIS — I10 Essential (primary) hypertension: Secondary | ICD-10-CM

## 2019-10-25 DIAGNOSIS — H353231 Exudative age-related macular degeneration, bilateral, with active choroidal neovascularization: Secondary | ICD-10-CM

## 2019-11-07 DIAGNOSIS — Z23 Encounter for immunization: Secondary | ICD-10-CM | POA: Diagnosis not present

## 2019-11-07 DIAGNOSIS — B0229 Other postherpetic nervous system involvement: Secondary | ICD-10-CM | POA: Diagnosis not present

## 2019-11-07 DIAGNOSIS — I10 Essential (primary) hypertension: Secondary | ICD-10-CM | POA: Diagnosis not present

## 2019-11-11 DIAGNOSIS — L309 Dermatitis, unspecified: Secondary | ICD-10-CM | POA: Diagnosis not present

## 2019-11-11 DIAGNOSIS — Z4689 Encounter for fitting and adjustment of other specified devices: Secondary | ICD-10-CM | POA: Diagnosis not present

## 2019-11-11 DIAGNOSIS — N8110 Cystocele, unspecified: Secondary | ICD-10-CM | POA: Diagnosis not present

## 2019-11-11 DIAGNOSIS — N952 Postmenopausal atrophic vaginitis: Secondary | ICD-10-CM | POA: Diagnosis not present

## 2019-11-25 DIAGNOSIS — Z23 Encounter for immunization: Secondary | ICD-10-CM | POA: Diagnosis not present

## 2019-12-16 ENCOUNTER — Encounter (INDEPENDENT_AMBULATORY_CARE_PROVIDER_SITE_OTHER): Payer: Medicare Other | Admitting: Ophthalmology

## 2019-12-16 ENCOUNTER — Other Ambulatory Visit: Payer: Self-pay

## 2019-12-16 DIAGNOSIS — H43813 Vitreous degeneration, bilateral: Secondary | ICD-10-CM

## 2019-12-16 DIAGNOSIS — H35033 Hypertensive retinopathy, bilateral: Secondary | ICD-10-CM

## 2019-12-16 DIAGNOSIS — I10 Essential (primary) hypertension: Secondary | ICD-10-CM

## 2019-12-16 DIAGNOSIS — H353231 Exudative age-related macular degeneration, bilateral, with active choroidal neovascularization: Secondary | ICD-10-CM

## 2019-12-19 DIAGNOSIS — E7800 Pure hypercholesterolemia, unspecified: Secondary | ICD-10-CM | POA: Diagnosis not present

## 2019-12-19 DIAGNOSIS — K219 Gastro-esophageal reflux disease without esophagitis: Secondary | ICD-10-CM | POA: Diagnosis not present

## 2019-12-19 DIAGNOSIS — E781 Pure hyperglyceridemia: Secondary | ICD-10-CM | POA: Diagnosis not present

## 2019-12-19 DIAGNOSIS — I10 Essential (primary) hypertension: Secondary | ICD-10-CM | POA: Diagnosis not present

## 2020-01-02 DIAGNOSIS — R109 Unspecified abdominal pain: Secondary | ICD-10-CM | POA: Diagnosis not present

## 2020-01-02 DIAGNOSIS — K5901 Slow transit constipation: Secondary | ICD-10-CM | POA: Diagnosis not present

## 2020-01-18 ENCOUNTER — Other Ambulatory Visit: Payer: Self-pay

## 2020-01-18 ENCOUNTER — Encounter (INDEPENDENT_AMBULATORY_CARE_PROVIDER_SITE_OTHER): Payer: Medicare Other | Admitting: Ophthalmology

## 2020-01-18 DIAGNOSIS — H35033 Hypertensive retinopathy, bilateral: Secondary | ICD-10-CM

## 2020-01-18 DIAGNOSIS — H43813 Vitreous degeneration, bilateral: Secondary | ICD-10-CM

## 2020-01-18 DIAGNOSIS — H353231 Exudative age-related macular degeneration, bilateral, with active choroidal neovascularization: Secondary | ICD-10-CM

## 2020-01-18 DIAGNOSIS — I10 Essential (primary) hypertension: Secondary | ICD-10-CM | POA: Diagnosis not present

## 2020-01-30 DIAGNOSIS — Z960 Presence of urogenital implants: Secondary | ICD-10-CM | POA: Diagnosis not present

## 2020-01-30 DIAGNOSIS — K5901 Slow transit constipation: Secondary | ICD-10-CM | POA: Diagnosis not present

## 2020-01-30 DIAGNOSIS — I10 Essential (primary) hypertension: Secondary | ICD-10-CM | POA: Diagnosis not present

## 2020-01-30 DIAGNOSIS — B0229 Other postherpetic nervous system involvement: Secondary | ICD-10-CM | POA: Diagnosis not present

## 2020-01-30 DIAGNOSIS — Z79899 Other long term (current) drug therapy: Secondary | ICD-10-CM | POA: Diagnosis not present

## 2020-02-01 DIAGNOSIS — E7800 Pure hypercholesterolemia, unspecified: Secondary | ICD-10-CM | POA: Diagnosis not present

## 2020-02-01 DIAGNOSIS — I10 Essential (primary) hypertension: Secondary | ICD-10-CM | POA: Diagnosis not present

## 2020-02-01 DIAGNOSIS — E781 Pure hyperglyceridemia: Secondary | ICD-10-CM | POA: Diagnosis not present

## 2020-02-01 DIAGNOSIS — K219 Gastro-esophageal reflux disease without esophagitis: Secondary | ICD-10-CM | POA: Diagnosis not present

## 2020-02-22 DIAGNOSIS — H00021 Hordeolum internum right upper eyelid: Secondary | ICD-10-CM | POA: Diagnosis not present

## 2020-02-22 DIAGNOSIS — H04123 Dry eye syndrome of bilateral lacrimal glands: Secondary | ICD-10-CM | POA: Diagnosis not present

## 2020-02-23 ENCOUNTER — Encounter (INDEPENDENT_AMBULATORY_CARE_PROVIDER_SITE_OTHER): Payer: Medicare Other | Admitting: Ophthalmology

## 2020-03-08 ENCOUNTER — Other Ambulatory Visit: Payer: Self-pay

## 2020-03-08 ENCOUNTER — Encounter (INDEPENDENT_AMBULATORY_CARE_PROVIDER_SITE_OTHER): Payer: Medicare Other | Admitting: Ophthalmology

## 2020-03-08 DIAGNOSIS — H353231 Exudative age-related macular degeneration, bilateral, with active choroidal neovascularization: Secondary | ICD-10-CM | POA: Diagnosis not present

## 2020-03-08 DIAGNOSIS — I10 Essential (primary) hypertension: Secondary | ICD-10-CM

## 2020-03-08 DIAGNOSIS — H43813 Vitreous degeneration, bilateral: Secondary | ICD-10-CM | POA: Diagnosis not present

## 2020-03-08 DIAGNOSIS — H35033 Hypertensive retinopathy, bilateral: Secondary | ICD-10-CM

## 2020-04-06 DIAGNOSIS — N952 Postmenopausal atrophic vaginitis: Secondary | ICD-10-CM | POA: Diagnosis not present

## 2020-04-06 DIAGNOSIS — Z4689 Encounter for fitting and adjustment of other specified devices: Secondary | ICD-10-CM | POA: Diagnosis not present

## 2020-04-06 DIAGNOSIS — N8111 Cystocele, midline: Secondary | ICD-10-CM | POA: Diagnosis not present

## 2020-04-06 DIAGNOSIS — N814 Uterovaginal prolapse, unspecified: Secondary | ICD-10-CM | POA: Diagnosis not present

## 2020-04-12 ENCOUNTER — Encounter (INDEPENDENT_AMBULATORY_CARE_PROVIDER_SITE_OTHER): Payer: Medicare Other | Admitting: Ophthalmology

## 2020-04-13 ENCOUNTER — Other Ambulatory Visit: Payer: Self-pay

## 2020-04-13 ENCOUNTER — Encounter (INDEPENDENT_AMBULATORY_CARE_PROVIDER_SITE_OTHER): Payer: Medicare Other | Admitting: Ophthalmology

## 2020-04-13 DIAGNOSIS — H35033 Hypertensive retinopathy, bilateral: Secondary | ICD-10-CM | POA: Diagnosis not present

## 2020-04-13 DIAGNOSIS — H353231 Exudative age-related macular degeneration, bilateral, with active choroidal neovascularization: Secondary | ICD-10-CM | POA: Diagnosis not present

## 2020-04-13 DIAGNOSIS — H43813 Vitreous degeneration, bilateral: Secondary | ICD-10-CM

## 2020-04-13 DIAGNOSIS — I10 Essential (primary) hypertension: Secondary | ICD-10-CM | POA: Diagnosis not present

## 2020-04-17 DIAGNOSIS — L281 Prurigo nodularis: Secondary | ICD-10-CM | POA: Diagnosis not present

## 2020-04-17 DIAGNOSIS — Z85828 Personal history of other malignant neoplasm of skin: Secondary | ICD-10-CM | POA: Diagnosis not present

## 2020-04-17 DIAGNOSIS — D0439 Carcinoma in situ of skin of other parts of face: Secondary | ICD-10-CM | POA: Diagnosis not present

## 2020-04-17 DIAGNOSIS — D485 Neoplasm of uncertain behavior of skin: Secondary | ICD-10-CM | POA: Diagnosis not present

## 2020-04-26 DIAGNOSIS — Z85828 Personal history of other malignant neoplasm of skin: Secondary | ICD-10-CM | POA: Diagnosis not present

## 2020-04-26 DIAGNOSIS — D0439 Carcinoma in situ of skin of other parts of face: Secondary | ICD-10-CM | POA: Diagnosis not present

## 2020-05-01 DIAGNOSIS — H02035 Senile entropion of left lower eyelid: Secondary | ICD-10-CM | POA: Diagnosis not present

## 2020-05-01 DIAGNOSIS — G500 Trigeminal neuralgia: Secondary | ICD-10-CM | POA: Diagnosis not present

## 2020-05-01 DIAGNOSIS — H04123 Dry eye syndrome of bilateral lacrimal glands: Secondary | ICD-10-CM | POA: Diagnosis not present

## 2020-05-17 ENCOUNTER — Other Ambulatory Visit: Payer: Self-pay

## 2020-05-17 ENCOUNTER — Encounter (INDEPENDENT_AMBULATORY_CARE_PROVIDER_SITE_OTHER): Payer: Medicare Other | Admitting: Ophthalmology

## 2020-05-17 DIAGNOSIS — H35033 Hypertensive retinopathy, bilateral: Secondary | ICD-10-CM | POA: Diagnosis not present

## 2020-05-17 DIAGNOSIS — H43813 Vitreous degeneration, bilateral: Secondary | ICD-10-CM

## 2020-05-17 DIAGNOSIS — H353231 Exudative age-related macular degeneration, bilateral, with active choroidal neovascularization: Secondary | ICD-10-CM | POA: Diagnosis not present

## 2020-05-17 DIAGNOSIS — I10 Essential (primary) hypertension: Secondary | ICD-10-CM

## 2020-05-23 ENCOUNTER — Other Ambulatory Visit: Payer: Self-pay | Admitting: Geriatric Medicine

## 2020-05-23 ENCOUNTER — Ambulatory Visit
Admission: RE | Admit: 2020-05-23 | Discharge: 2020-05-23 | Disposition: A | Discharge status: Home / Self Care | Payer: Medicare Other | Source: Ambulatory Visit | Attending: Geriatric Medicine | Admitting: Geriatric Medicine

## 2020-05-23 DIAGNOSIS — R0781 Pleurodynia: Secondary | ICD-10-CM | POA: Diagnosis not present

## 2020-05-23 DIAGNOSIS — I10 Essential (primary) hypertension: Secondary | ICD-10-CM | POA: Diagnosis not present

## 2020-05-31 DIAGNOSIS — Z23 Encounter for immunization: Secondary | ICD-10-CM | POA: Diagnosis not present

## 2020-06-11 DIAGNOSIS — K219 Gastro-esophageal reflux disease without esophagitis: Secondary | ICD-10-CM | POA: Diagnosis not present

## 2020-06-11 DIAGNOSIS — E781 Pure hyperglyceridemia: Secondary | ICD-10-CM | POA: Diagnosis not present

## 2020-06-11 DIAGNOSIS — I10 Essential (primary) hypertension: Secondary | ICD-10-CM | POA: Diagnosis not present

## 2020-06-11 DIAGNOSIS — E7800 Pure hypercholesterolemia, unspecified: Secondary | ICD-10-CM | POA: Diagnosis not present

## 2020-06-12 DIAGNOSIS — D2272 Melanocytic nevi of left lower limb, including hip: Secondary | ICD-10-CM | POA: Diagnosis not present

## 2020-06-12 DIAGNOSIS — L821 Other seborrheic keratosis: Secondary | ICD-10-CM | POA: Diagnosis not present

## 2020-06-12 DIAGNOSIS — Z85828 Personal history of other malignant neoplasm of skin: Secondary | ICD-10-CM | POA: Diagnosis not present

## 2020-06-12 DIAGNOSIS — D485 Neoplasm of uncertain behavior of skin: Secondary | ICD-10-CM | POA: Diagnosis not present

## 2020-06-12 DIAGNOSIS — D2271 Melanocytic nevi of right lower limb, including hip: Secondary | ICD-10-CM | POA: Diagnosis not present

## 2020-06-12 DIAGNOSIS — D2261 Melanocytic nevi of right upper limb, including shoulder: Secondary | ICD-10-CM | POA: Diagnosis not present

## 2020-06-12 DIAGNOSIS — L820 Inflamed seborrheic keratosis: Secondary | ICD-10-CM | POA: Diagnosis not present

## 2020-06-12 DIAGNOSIS — L905 Scar conditions and fibrosis of skin: Secondary | ICD-10-CM | POA: Diagnosis not present

## 2020-06-12 DIAGNOSIS — D225 Melanocytic nevi of trunk: Secondary | ICD-10-CM | POA: Diagnosis not present

## 2020-06-12 DIAGNOSIS — L570 Actinic keratosis: Secondary | ICD-10-CM | POA: Diagnosis not present

## 2020-06-12 DIAGNOSIS — I788 Other diseases of capillaries: Secondary | ICD-10-CM | POA: Diagnosis not present

## 2020-06-12 DIAGNOSIS — D2262 Melanocytic nevi of left upper limb, including shoulder: Secondary | ICD-10-CM | POA: Diagnosis not present

## 2020-06-13 DIAGNOSIS — E7800 Pure hypercholesterolemia, unspecified: Secondary | ICD-10-CM | POA: Diagnosis not present

## 2020-06-13 DIAGNOSIS — Z79899 Other long term (current) drug therapy: Secondary | ICD-10-CM | POA: Diagnosis not present

## 2020-06-13 DIAGNOSIS — Z1389 Encounter for screening for other disorder: Secondary | ICD-10-CM | POA: Diagnosis not present

## 2020-06-13 DIAGNOSIS — I10 Essential (primary) hypertension: Secondary | ICD-10-CM | POA: Diagnosis not present

## 2020-06-13 DIAGNOSIS — K9089 Other intestinal malabsorption: Secondary | ICD-10-CM | POA: Diagnosis not present

## 2020-06-13 DIAGNOSIS — B0229 Other postherpetic nervous system involvement: Secondary | ICD-10-CM | POA: Diagnosis not present

## 2020-06-13 DIAGNOSIS — Z0000 Encounter for general adult medical examination without abnormal findings: Secondary | ICD-10-CM | POA: Diagnosis not present

## 2020-06-13 DIAGNOSIS — K219 Gastro-esophageal reflux disease without esophagitis: Secondary | ICD-10-CM | POA: Diagnosis not present

## 2020-06-28 ENCOUNTER — Other Ambulatory Visit: Payer: Self-pay

## 2020-06-28 ENCOUNTER — Encounter (INDEPENDENT_AMBULATORY_CARE_PROVIDER_SITE_OTHER): Payer: Medicare Other | Admitting: Ophthalmology

## 2020-06-28 DIAGNOSIS — H35033 Hypertensive retinopathy, bilateral: Secondary | ICD-10-CM

## 2020-06-28 DIAGNOSIS — H43813 Vitreous degeneration, bilateral: Secondary | ICD-10-CM

## 2020-06-28 DIAGNOSIS — H353231 Exudative age-related macular degeneration, bilateral, with active choroidal neovascularization: Secondary | ICD-10-CM | POA: Diagnosis not present

## 2020-06-28 DIAGNOSIS — I10 Essential (primary) hypertension: Secondary | ICD-10-CM | POA: Diagnosis not present

## 2020-07-02 DIAGNOSIS — Z4689 Encounter for fitting and adjustment of other specified devices: Secondary | ICD-10-CM | POA: Diagnosis not present

## 2020-07-02 DIAGNOSIS — N8111 Cystocele, midline: Secondary | ICD-10-CM | POA: Diagnosis not present

## 2020-07-02 DIAGNOSIS — N952 Postmenopausal atrophic vaginitis: Secondary | ICD-10-CM | POA: Diagnosis not present

## 2020-07-02 DIAGNOSIS — N814 Uterovaginal prolapse, unspecified: Secondary | ICD-10-CM | POA: Diagnosis not present

## 2020-08-01 DIAGNOSIS — R300 Dysuria: Secondary | ICD-10-CM | POA: Diagnosis not present

## 2020-08-09 ENCOUNTER — Encounter (INDEPENDENT_AMBULATORY_CARE_PROVIDER_SITE_OTHER): Payer: Medicare Other | Admitting: Ophthalmology

## 2020-08-13 ENCOUNTER — Encounter (INDEPENDENT_AMBULATORY_CARE_PROVIDER_SITE_OTHER): Payer: Medicare Other | Admitting: Ophthalmology

## 2020-08-15 ENCOUNTER — Other Ambulatory Visit: Payer: Self-pay

## 2020-08-15 ENCOUNTER — Encounter (INDEPENDENT_AMBULATORY_CARE_PROVIDER_SITE_OTHER): Payer: Medicare Other | Admitting: Ophthalmology

## 2020-08-15 DIAGNOSIS — H353231 Exudative age-related macular degeneration, bilateral, with active choroidal neovascularization: Secondary | ICD-10-CM | POA: Diagnosis not present

## 2020-08-15 DIAGNOSIS — H35033 Hypertensive retinopathy, bilateral: Secondary | ICD-10-CM

## 2020-08-15 DIAGNOSIS — I10 Essential (primary) hypertension: Secondary | ICD-10-CM | POA: Diagnosis not present

## 2020-08-15 DIAGNOSIS — H43813 Vitreous degeneration, bilateral: Secondary | ICD-10-CM | POA: Diagnosis not present

## 2020-08-22 DIAGNOSIS — H353211 Exudative age-related macular degeneration, right eye, with active choroidal neovascularization: Secondary | ICD-10-CM | POA: Diagnosis not present

## 2020-08-22 DIAGNOSIS — H04123 Dry eye syndrome of bilateral lacrimal glands: Secondary | ICD-10-CM | POA: Diagnosis not present

## 2020-08-22 DIAGNOSIS — H353123 Nonexudative age-related macular degeneration, left eye, advanced atrophic without subfoveal involvement: Secondary | ICD-10-CM | POA: Diagnosis not present

## 2020-08-22 DIAGNOSIS — H40013 Open angle with borderline findings, low risk, bilateral: Secondary | ICD-10-CM | POA: Diagnosis not present

## 2020-09-26 ENCOUNTER — Encounter (INDEPENDENT_AMBULATORY_CARE_PROVIDER_SITE_OTHER): Payer: Medicare Other | Admitting: Ophthalmology

## 2020-09-26 ENCOUNTER — Other Ambulatory Visit: Payer: Self-pay

## 2020-09-26 DIAGNOSIS — H353231 Exudative age-related macular degeneration, bilateral, with active choroidal neovascularization: Secondary | ICD-10-CM | POA: Diagnosis not present

## 2020-09-26 DIAGNOSIS — I10 Essential (primary) hypertension: Secondary | ICD-10-CM | POA: Diagnosis not present

## 2020-09-26 DIAGNOSIS — H35033 Hypertensive retinopathy, bilateral: Secondary | ICD-10-CM | POA: Diagnosis not present

## 2020-09-26 DIAGNOSIS — H43813 Vitreous degeneration, bilateral: Secondary | ICD-10-CM | POA: Diagnosis not present

## 2020-10-09 DIAGNOSIS — N952 Postmenopausal atrophic vaginitis: Secondary | ICD-10-CM | POA: Diagnosis not present

## 2020-10-09 DIAGNOSIS — I10 Essential (primary) hypertension: Secondary | ICD-10-CM | POA: Diagnosis not present

## 2020-10-09 DIAGNOSIS — N8111 Cystocele, midline: Secondary | ICD-10-CM | POA: Diagnosis not present

## 2020-10-09 DIAGNOSIS — Z4689 Encounter for fitting and adjustment of other specified devices: Secondary | ICD-10-CM | POA: Diagnosis not present

## 2020-10-09 DIAGNOSIS — S30814A Abrasion of vagina and vulva, initial encounter: Secondary | ICD-10-CM | POA: Diagnosis not present

## 2020-10-09 DIAGNOSIS — N814 Uterovaginal prolapse, unspecified: Secondary | ICD-10-CM | POA: Diagnosis not present

## 2020-10-23 DIAGNOSIS — N952 Postmenopausal atrophic vaginitis: Secondary | ICD-10-CM | POA: Diagnosis not present

## 2020-10-23 DIAGNOSIS — Z4689 Encounter for fitting and adjustment of other specified devices: Secondary | ICD-10-CM | POA: Diagnosis not present

## 2020-10-23 DIAGNOSIS — N814 Uterovaginal prolapse, unspecified: Secondary | ICD-10-CM | POA: Diagnosis not present

## 2020-10-23 DIAGNOSIS — N8111 Cystocele, midline: Secondary | ICD-10-CM | POA: Diagnosis not present

## 2020-10-30 DIAGNOSIS — R300 Dysuria: Secondary | ICD-10-CM | POA: Diagnosis not present

## 2020-11-06 ENCOUNTER — Inpatient Hospital Stay (HOSPITAL_COMMUNITY)
Admission: EM | Admit: 2020-11-06 | Discharge: 2020-11-12 | DRG: 522 | Disposition: A | Discharge status: Skilled Nursing Facility | Payer: Medicare Other | Attending: Internal Medicine | Admitting: Internal Medicine

## 2020-11-06 ENCOUNTER — Emergency Department (HOSPITAL_COMMUNITY): Payer: Medicare Other

## 2020-11-06 ENCOUNTER — Encounter (HOSPITAL_COMMUNITY): Payer: Self-pay

## 2020-11-06 ENCOUNTER — Other Ambulatory Visit: Payer: Self-pay

## 2020-11-06 DIAGNOSIS — E876 Hypokalemia: Secondary | ICD-10-CM | POA: Diagnosis present

## 2020-11-06 DIAGNOSIS — I10 Essential (primary) hypertension: Secondary | ICD-10-CM | POA: Diagnosis not present

## 2020-11-06 DIAGNOSIS — Z881 Allergy status to other antibiotic agents status: Secondary | ICD-10-CM

## 2020-11-06 DIAGNOSIS — D72829 Elevated white blood cell count, unspecified: Secondary | ICD-10-CM | POA: Diagnosis present

## 2020-11-06 DIAGNOSIS — K5900 Constipation, unspecified: Secondary | ICD-10-CM | POA: Diagnosis not present

## 2020-11-06 DIAGNOSIS — B0229 Other postherpetic nervous system involvement: Secondary | ICD-10-CM | POA: Diagnosis present

## 2020-11-06 DIAGNOSIS — S72001A Fracture of unspecified part of neck of right femur, initial encounter for closed fracture: Secondary | ICD-10-CM

## 2020-11-06 DIAGNOSIS — M1611 Unilateral primary osteoarthritis, right hip: Secondary | ICD-10-CM | POA: Diagnosis not present

## 2020-11-06 DIAGNOSIS — E878 Other disorders of electrolyte and fluid balance, not elsewhere classified: Secondary | ICD-10-CM | POA: Diagnosis present

## 2020-11-06 DIAGNOSIS — Z885 Allergy status to narcotic agent status: Secondary | ICD-10-CM

## 2020-11-06 DIAGNOSIS — K219 Gastro-esophageal reflux disease without esophagitis: Secondary | ICD-10-CM | POA: Diagnosis present

## 2020-11-06 DIAGNOSIS — M25461 Effusion, right knee: Secondary | ICD-10-CM | POA: Diagnosis not present

## 2020-11-06 DIAGNOSIS — Z9889 Other specified postprocedural states: Secondary | ICD-10-CM | POA: Diagnosis not present

## 2020-11-06 DIAGNOSIS — F419 Anxiety disorder, unspecified: Secondary | ICD-10-CM | POA: Diagnosis present

## 2020-11-06 DIAGNOSIS — S72011A Unspecified intracapsular fracture of right femur, initial encounter for closed fracture: Principal | ICD-10-CM | POA: Diagnosis present

## 2020-11-06 DIAGNOSIS — E871 Hypo-osmolality and hyponatremia: Secondary | ICD-10-CM | POA: Diagnosis present

## 2020-11-06 DIAGNOSIS — M25569 Pain in unspecified knee: Secondary | ICD-10-CM | POA: Diagnosis present

## 2020-11-06 DIAGNOSIS — Y92009 Unspecified place in unspecified non-institutional (private) residence as the place of occurrence of the external cause: Secondary | ICD-10-CM

## 2020-11-06 DIAGNOSIS — Z419 Encounter for procedure for purposes other than remedying health state, unspecified: Secondary | ICD-10-CM

## 2020-11-06 DIAGNOSIS — R609 Edema, unspecified: Secondary | ICD-10-CM | POA: Diagnosis not present

## 2020-11-06 DIAGNOSIS — Z91041 Radiographic dye allergy status: Secondary | ICD-10-CM

## 2020-11-06 DIAGNOSIS — R590 Localized enlarged lymph nodes: Secondary | ICD-10-CM | POA: Diagnosis not present

## 2020-11-06 DIAGNOSIS — W010XXA Fall on same level from slipping, tripping and stumbling without subsequent striking against object, initial encounter: Secondary | ICD-10-CM | POA: Diagnosis present

## 2020-11-06 DIAGNOSIS — S7291XA Unspecified fracture of right femur, initial encounter for closed fracture: Secondary | ICD-10-CM | POA: Diagnosis not present

## 2020-11-06 DIAGNOSIS — Z880 Allergy status to penicillin: Secondary | ICD-10-CM

## 2020-11-06 DIAGNOSIS — S8291XA Unspecified fracture of right lower leg, initial encounter for closed fracture: Secondary | ICD-10-CM | POA: Diagnosis not present

## 2020-11-06 DIAGNOSIS — M25551 Pain in right hip: Secondary | ICD-10-CM | POA: Diagnosis not present

## 2020-11-06 DIAGNOSIS — W19XXXA Unspecified fall, initial encounter: Secondary | ICD-10-CM

## 2020-11-06 DIAGNOSIS — Z79899 Other long term (current) drug therapy: Secondary | ICD-10-CM

## 2020-11-06 DIAGNOSIS — Z20822 Contact with and (suspected) exposure to covid-19: Secondary | ICD-10-CM | POA: Diagnosis present

## 2020-11-06 DIAGNOSIS — R0902 Hypoxemia: Secondary | ICD-10-CM

## 2020-11-06 DIAGNOSIS — Z87440 Personal history of urinary (tract) infections: Secondary | ICD-10-CM

## 2020-11-06 DIAGNOSIS — Z79890 Hormone replacement therapy (postmenopausal): Secondary | ICD-10-CM

## 2020-11-06 DIAGNOSIS — Z7982 Long term (current) use of aspirin: Secondary | ICD-10-CM

## 2020-11-06 DIAGNOSIS — E785 Hyperlipidemia, unspecified: Secondary | ICD-10-CM | POA: Diagnosis present

## 2020-11-06 DIAGNOSIS — Z043 Encounter for examination and observation following other accident: Secondary | ICD-10-CM | POA: Diagnosis not present

## 2020-11-06 DIAGNOSIS — Z87891 Personal history of nicotine dependence: Secondary | ICD-10-CM

## 2020-11-06 DIAGNOSIS — I6523 Occlusion and stenosis of bilateral carotid arteries: Secondary | ICD-10-CM | POA: Diagnosis not present

## 2020-11-06 DIAGNOSIS — S3993XA Unspecified injury of pelvis, initial encounter: Secondary | ICD-10-CM | POA: Diagnosis not present

## 2020-11-06 DIAGNOSIS — K5730 Diverticulosis of large intestine without perforation or abscess without bleeding: Secondary | ICD-10-CM | POA: Diagnosis not present

## 2020-11-06 DIAGNOSIS — I6782 Cerebral ischemia: Secondary | ICD-10-CM | POA: Diagnosis not present

## 2020-11-06 DIAGNOSIS — S0993XA Unspecified injury of face, initial encounter: Secondary | ICD-10-CM | POA: Diagnosis not present

## 2020-11-06 MED ORDER — SODIUM CHLORIDE 0.9 % IV BOLUS: 1000.0000 mL | Freq: Once | INTRAVENOUS | Status: DC

## 2020-11-06 MED ORDER — ACETAMINOPHEN 500 MG PO TABS
1000.0000 mg | ORAL_TABLET | Freq: Once | ORAL | Status: AC
  Administered 2020-11-07: 1000 mg via ORAL
  Filled 2020-11-06: qty 2

## 2020-11-06 NOTE — ED Triage Notes (Addendum)
Pt arrives EMS from home ([facility]) after fall about 2145 tonight. C/o right knee, hip and lower back pain. Denies anticoagulants and also denies head trauma.   84 - heart rate  178/84 - bp  93% ra  Cbg 119

## 2020-11-07 ENCOUNTER — Encounter (INDEPENDENT_AMBULATORY_CARE_PROVIDER_SITE_OTHER): Payer: Medicare Other | Admitting: Ophthalmology

## 2020-11-07 ENCOUNTER — Encounter (HOSPITAL_COMMUNITY): Payer: Self-pay | Admitting: Emergency Medicine

## 2020-11-07 ENCOUNTER — Emergency Department (HOSPITAL_COMMUNITY): Payer: Medicare Other

## 2020-11-07 DIAGNOSIS — M25551 Pain in right hip: Secondary | ICD-10-CM | POA: Diagnosis present

## 2020-11-07 DIAGNOSIS — R531 Weakness: Secondary | ICD-10-CM | POA: Diagnosis not present

## 2020-11-07 DIAGNOSIS — E878 Other disorders of electrolyte and fluid balance, not elsewhere classified: Secondary | ICD-10-CM | POA: Diagnosis not present

## 2020-11-07 DIAGNOSIS — W19XXXA Unspecified fall, initial encounter: Secondary | ICD-10-CM | POA: Diagnosis not present

## 2020-11-07 DIAGNOSIS — E876 Hypokalemia: Secondary | ICD-10-CM | POA: Diagnosis not present

## 2020-11-07 DIAGNOSIS — I10 Essential (primary) hypertension: Secondary | ICD-10-CM | POA: Diagnosis not present

## 2020-11-07 DIAGNOSIS — R079 Chest pain, unspecified: Secondary | ICD-10-CM | POA: Diagnosis not present

## 2020-11-07 DIAGNOSIS — S72041A Displaced fracture of base of neck of right femur, initial encounter for closed fracture: Secondary | ICD-10-CM | POA: Diagnosis not present

## 2020-11-07 DIAGNOSIS — K5900 Constipation, unspecified: Secondary | ICD-10-CM | POA: Diagnosis not present

## 2020-11-07 DIAGNOSIS — Z881 Allergy status to other antibiotic agents status: Secondary | ICD-10-CM | POA: Diagnosis not present

## 2020-11-07 DIAGNOSIS — S72011A Unspecified intracapsular fracture of right femur, initial encounter for closed fracture: Secondary | ICD-10-CM | POA: Diagnosis not present

## 2020-11-07 DIAGNOSIS — M25461 Effusion, right knee: Secondary | ICD-10-CM | POA: Diagnosis not present

## 2020-11-07 DIAGNOSIS — R590 Localized enlarged lymph nodes: Secondary | ICD-10-CM | POA: Diagnosis not present

## 2020-11-07 DIAGNOSIS — E785 Hyperlipidemia, unspecified: Secondary | ICD-10-CM | POA: Diagnosis not present

## 2020-11-07 DIAGNOSIS — Z79890 Hormone replacement therapy (postmenopausal): Secondary | ICD-10-CM | POA: Diagnosis not present

## 2020-11-07 DIAGNOSIS — F419 Anxiety disorder, unspecified: Secondary | ICD-10-CM | POA: Diagnosis present

## 2020-11-07 DIAGNOSIS — E569 Vitamin deficiency, unspecified: Secondary | ICD-10-CM | POA: Diagnosis not present

## 2020-11-07 DIAGNOSIS — Z792 Long term (current) use of antibiotics: Secondary | ICD-10-CM | POA: Diagnosis not present

## 2020-11-07 DIAGNOSIS — D72829 Elevated white blood cell count, unspecified: Secondary | ICD-10-CM | POA: Diagnosis not present

## 2020-11-07 DIAGNOSIS — S72001D Fracture of unspecified part of neck of right femur, subsequent encounter for closed fracture with routine healing: Secondary | ICD-10-CM | POA: Diagnosis not present

## 2020-11-07 DIAGNOSIS — E871 Hypo-osmolality and hyponatremia: Secondary | ICD-10-CM | POA: Diagnosis not present

## 2020-11-07 DIAGNOSIS — S72001A Fracture of unspecified part of neck of right femur, initial encounter for closed fracture: Secondary | ICD-10-CM

## 2020-11-07 DIAGNOSIS — Z880 Allergy status to penicillin: Secondary | ICD-10-CM | POA: Diagnosis not present

## 2020-11-07 DIAGNOSIS — Z7982 Long term (current) use of aspirin: Secondary | ICD-10-CM | POA: Diagnosis not present

## 2020-11-07 DIAGNOSIS — S3993XA Unspecified injury of pelvis, initial encounter: Secondary | ICD-10-CM | POA: Diagnosis not present

## 2020-11-07 DIAGNOSIS — R5381 Other malaise: Secondary | ICD-10-CM | POA: Diagnosis not present

## 2020-11-07 DIAGNOSIS — Y92009 Unspecified place in unspecified non-institutional (private) residence as the place of occurrence of the external cause: Secondary | ICD-10-CM | POA: Diagnosis not present

## 2020-11-07 DIAGNOSIS — W010XXA Fall on same level from slipping, tripping and stumbling without subsequent striking against object, initial encounter: Secondary | ICD-10-CM | POA: Diagnosis present

## 2020-11-07 DIAGNOSIS — Z471 Aftercare following joint replacement surgery: Secondary | ICD-10-CM | POA: Diagnosis not present

## 2020-11-07 DIAGNOSIS — R52 Pain, unspecified: Secondary | ICD-10-CM | POA: Diagnosis not present

## 2020-11-07 DIAGNOSIS — M25569 Pain in unspecified knee: Secondary | ICD-10-CM | POA: Diagnosis present

## 2020-11-07 DIAGNOSIS — N390 Urinary tract infection, site not specified: Secondary | ICD-10-CM | POA: Diagnosis not present

## 2020-11-07 DIAGNOSIS — Z87440 Personal history of urinary (tract) infections: Secondary | ICD-10-CM | POA: Diagnosis not present

## 2020-11-07 DIAGNOSIS — Z91041 Radiographic dye allergy status: Secondary | ICD-10-CM | POA: Diagnosis not present

## 2020-11-07 DIAGNOSIS — K219 Gastro-esophageal reflux disease without esophagitis: Secondary | ICD-10-CM | POA: Diagnosis not present

## 2020-11-07 DIAGNOSIS — Z885 Allergy status to narcotic agent status: Secondary | ICD-10-CM | POA: Diagnosis not present

## 2020-11-07 DIAGNOSIS — Z79899 Other long term (current) drug therapy: Secondary | ICD-10-CM | POA: Diagnosis not present

## 2020-11-07 DIAGNOSIS — R0902 Hypoxemia: Secondary | ICD-10-CM | POA: Diagnosis not present

## 2020-11-07 DIAGNOSIS — I209 Angina pectoris, unspecified: Secondary | ICD-10-CM | POA: Diagnosis not present

## 2020-11-07 DIAGNOSIS — Z20822 Contact with and (suspected) exposure to covid-19: Secondary | ICD-10-CM | POA: Diagnosis not present

## 2020-11-07 DIAGNOSIS — Z7401 Bed confinement status: Secondary | ICD-10-CM | POA: Diagnosis not present

## 2020-11-07 DIAGNOSIS — Z87891 Personal history of nicotine dependence: Secondary | ICD-10-CM | POA: Diagnosis not present

## 2020-11-07 DIAGNOSIS — B0229 Other postherpetic nervous system involvement: Secondary | ICD-10-CM | POA: Diagnosis not present

## 2020-11-07 DIAGNOSIS — Z96641 Presence of right artificial hip joint: Secondary | ICD-10-CM | POA: Diagnosis not present

## 2020-11-07 DIAGNOSIS — K5730 Diverticulosis of large intestine without perforation or abscess without bleeding: Secondary | ICD-10-CM | POA: Diagnosis not present

## 2020-11-07 DIAGNOSIS — M25561 Pain in right knee: Secondary | ICD-10-CM | POA: Diagnosis not present

## 2020-11-07 LAB — BASIC METABOLIC PANEL
Anion gap: 12 (ref 5–15)
BUN: 11 mg/dL (ref 8–23)
CO2: 24 mmol/L (ref 22–32)
Calcium: 9.7 mg/dL (ref 8.9–10.3)
Chloride: 94 mmol/L — ABNORMAL LOW (ref 98–111)
Creatinine, Ser: 0.48 mg/dL (ref 0.44–1.00)
GFR, Estimated: >60 mL/min (ref >60)
Glucose, Bld: 132 mg/dL — ABNORMAL HIGH (ref 70–99)
Potassium: 3.4 mmol/L — ABNORMAL LOW (ref 3.5–5.1)
Sodium: 130 mmol/L — ABNORMAL LOW (ref 135–145)

## 2020-11-07 LAB — PROTIME-INR
INR: 1 (ref 0.8–1.2)
Prothrombin Time: 13.1 seconds (ref 11.4–15.2)

## 2020-11-07 LAB — CBC
HCT: 42.8 % (ref 36.0–46.0)
Hemoglobin: 14.4 g/dL (ref 12.0–15.0)
MCH: 29 pg (ref 26.0–34.0)
MCHC: 33.6 g/dL (ref 30.0–36.0)
MCV: 86.1 fL (ref 80.0–100.0)
Platelets: 271 10*3/uL (ref 150–400)
RBC: 4.97 MIL/uL (ref 3.87–5.11)
RDW: 13.1 % (ref 11.5–15.5)
WBC: 16.2 10*3/uL — ABNORMAL HIGH (ref 4.0–10.5)
nRBC: 0 % (ref 0.0–0.2)

## 2020-11-07 LAB — SARS CORONAVIRUS 2 (TAT 6-24 HRS): SARS Coronavirus 2: NEGATIVE

## 2020-11-07 MED ORDER — AMLODIPINE BESYLATE 10 MG PO TABS
10.0000 mg | ORAL_TABLET | Freq: Every day | ORAL | Status: DC
  Administered 2020-11-07 – 2020-11-11 (×5): 10 mg via ORAL
  Filled 2020-11-07 (×5): qty 1

## 2020-11-07 MED ORDER — DICLOFENAC SODIUM 1 % EX GEL: 4.0000 g | Freq: Four times a day (QID) | CUTANEOUS | 0 refills | Status: DC

## 2020-11-07 MED ORDER — LIDOCAINE 5 % EX PTCH
3.0000 | MEDICATED_PATCH | CUTANEOUS | Status: DC
Start: 2020-11-07 — End: 2020-11-12
  Administered 2020-11-07: 02:00:00 3 via TRANSDERMAL
  Filled 2020-11-07 (×6): qty 3

## 2020-11-07 MED ORDER — LIDOCAINE 5 % EX PTCH: 1.0000 | MEDICATED_PATCH | CUTANEOUS | 0 refills | Status: DC

## 2020-11-07 MED ORDER — SODIUM CHLORIDE 0.9 % IV SOLN: INTRAVENOUS | Status: DC

## 2020-11-07 MED ORDER — LISINOPRIL 20 MG PO TABS
40.0000 mg | ORAL_TABLET | Freq: Every day | ORAL | Status: DC
  Administered 2020-11-09 – 2020-11-12 (×4): 40 mg via ORAL
  Filled 2020-11-07 (×4): qty 2

## 2020-11-07 MED ORDER — METHOCARBAMOL 500 MG PO TABS
500.0000 mg | ORAL_TABLET | Freq: Four times a day (QID) | ORAL | Status: DC | PRN
  Administered 2020-11-07 (×2): 500 mg via ORAL
  Filled 2020-11-07 (×2): qty 1

## 2020-11-07 MED ORDER — ADULT MULTIVITAMIN W/MINERALS CH: 1.0000 | ORAL_TABLET | Freq: Every day | ORAL | Status: DC

## 2020-11-07 MED ORDER — POTASSIUM CHLORIDE 10 MEQ/100ML IV SOLN
10.0000 meq | INTRAVENOUS | Status: AC
  Administered 2020-11-07 (×4): 10 meq via INTRAVENOUS
  Filled 2020-11-07: qty 100

## 2020-11-07 MED ORDER — HYDROCODONE-ACETAMINOPHEN 5-325 MG PO TABS
1.0000 | ORAL_TABLET | Freq: Four times a day (QID) | ORAL | Status: DC | PRN
Start: 2020-11-07 — End: 2020-11-08
  Administered 2020-11-07 (×3): 2 via ORAL
  Filled 2020-11-07 (×3): qty 2

## 2020-11-07 MED ORDER — LEVOFLOXACIN 500 MG PO TABS
250.0000 mg | ORAL_TABLET | Freq: Every day | ORAL | Status: AC
  Administered 2020-11-07 – 2020-11-11 (×4): 250 mg via ORAL
  Filled 2020-11-07 (×4): qty 1

## 2020-11-07 MED ORDER — MAGNESIUM HYDROXIDE 400 MG/5ML PO SUSP: 30.0000 mL | Freq: Every day | ORAL | Status: DC | PRN

## 2020-11-07 MED ORDER — ROSUVASTATIN CALCIUM 10 MG PO TABS
10.0000 mg | ORAL_TABLET | ORAL | Status: DC
  Administered 2020-11-09 – 2020-11-11 (×2): 10 mg via ORAL
  Filled 2020-11-07 (×2): qty 1

## 2020-11-07 MED ORDER — POLYETHYLENE GLYCOL 3350 17 G PO PACK: 17.0000 g | PACK | Freq: Every day | ORAL | Status: DC

## 2020-11-07 MED ORDER — ACETAMINOPHEN 325 MG PO TABS: 650.0000 mg | ORAL_TABLET | ORAL | Status: DC | PRN

## 2020-11-07 MED ORDER — COENZYME Q-10 100 MG PO CAPS: 100.0000 mg | ORAL_CAPSULE | Freq: Every day | ORAL | Status: DC

## 2020-11-07 MED ORDER — QUINAPRIL HCL 10 MG PO TABS: 40.0000 mg | ORAL_TABLET | Freq: Every day | ORAL | Status: DC

## 2020-11-07 MED ORDER — ONDANSETRON HCL 4 MG/2ML IJ SOLN
4.0000 mg | Freq: Four times a day (QID) | INTRAMUSCULAR | Status: DC | PRN
  Administered 2020-11-07: 4 mg via INTRAVENOUS
  Filled 2020-11-07: qty 2

## 2020-11-07 MED ORDER — OMEPRAZOLE MAGNESIUM 20 MG PO TBEC: 20.0000 mg | DELAYED_RELEASE_TABLET | Freq: Every day | ORAL | Status: DC

## 2020-11-07 MED ORDER — OMEGA-3 1000 MG PO CAPS: 1000.0000 mg | ORAL_CAPSULE | Freq: Every day | ORAL | Status: DC

## 2020-11-07 MED ORDER — TRAZODONE HCL 50 MG PO TABS: 25.0000 mg | ORAL_TABLET | Freq: Every evening | ORAL | Status: DC | PRN

## 2020-11-07 MED ORDER — PANTOPRAZOLE SODIUM 40 MG PO TBEC
40.0000 mg | DELAYED_RELEASE_TABLET | Freq: Every day | ORAL | Status: DC
  Administered 2020-11-09: 40 mg via ORAL
  Filled 2020-11-07: qty 1

## 2020-11-07 MED ORDER — CALCIUM CARBONATE 1250 (500 CA) MG PO TABS
1.0000 | ORAL_TABLET | Freq: Every day | ORAL | Status: DC
  Administered 2020-11-09 – 2020-11-12 (×4): 500 mg via ORAL
  Filled 2020-11-07 (×4): qty 1

## 2020-11-07 MED ORDER — NAPROXEN 500 MG PO TABS
250.0000 mg | ORAL_TABLET | Freq: Once | ORAL | Status: AC
  Administered 2020-11-07: 250 mg via ORAL
  Filled 2020-11-07: qty 1

## 2020-11-07 MED ORDER — MORPHINE SULFATE (PF) 2 MG/ML IV SOLN
2.0000 mg | INTRAVENOUS | Status: DC | PRN
  Administered 2020-11-07 (×2): 2 mg via INTRAVENOUS
  Filled 2020-11-07 (×2): qty 1

## 2020-11-07 MED ORDER — POLYETHYLENE GLYCOL 3350 17 G PO PACK
17.0000 g | PACK | Freq: Every day | ORAL | Status: DC
  Administered 2020-11-07 – 2020-11-10 (×3): 17 g via ORAL
  Filled 2020-11-07 (×4): qty 1

## 2020-11-07 MED ORDER — SACCHAROMYCES BOULARDII 250 MG PO CAPS
250.0000 mg | ORAL_CAPSULE | ORAL | Status: DC
  Administered 2020-11-09 – 2020-11-11 (×2): 250 mg via ORAL
  Filled 2020-11-07 (×2): qty 1

## 2020-11-07 MED ORDER — ONDANSETRON HCL 4 MG/2ML IJ SOLN
4.0000 mg | INTRAMUSCULAR | Status: DC | PRN
  Administered 2020-11-08: 4 mg via INTRAVENOUS
  Filled 2020-11-07: qty 2

## 2020-11-07 MED ORDER — GATIFLOXACIN 0.5 % OP SOLN
1.0000 [drp] | Freq: Four times a day (QID) | OPHTHALMIC | Status: DC
  Filled 2020-11-07: qty 2.5

## 2020-11-07 MED ORDER — OMEGA-3-ACID ETHYL ESTERS 1 G PO CAPS
1.0000 g | ORAL_CAPSULE | Freq: Every day | ORAL | Status: DC
  Administered 2020-11-09 – 2020-11-12 (×4): 1 g via ORAL
  Filled 2020-11-07 (×4): qty 1

## 2020-11-07 MED ORDER — ESTRADIOL 0.1 MG/GM VA CREA
1.0000 | TOPICAL_CREAM | VAGINAL | Status: DC
  Administered 2020-11-09: 1 via VAGINAL
  Filled 2020-11-07: qty 42.5

## 2020-11-07 NOTE — H&P (View-Only) (Signed)
ORTHOPAEDIC CONSULTATION  REQUESTING PHYSICIAN: Shelly Coss, MD  PCP:  Lajean Manes, MD  Chief Complaint: Fall with right hip & knee pain   HPI: [name] [name] is a 87 y.o. female who presented to [facility] ED yesterday after a fall at home. She had gone on her evening walk, and then gotten ready for bed, and when she went to stand up from her recliner her hip gave out causing her to fall. She reports she was unable to get up, but was able to call the staff at her facility and her family to help her. In the ED, CT revealed right femoral neck fracture. Orthopaedics was consulted for evaluation and management.   Today, she is resting in bed with her daughter, [name] [name], at the bedside. She reports she is normally very active. She lives at [facility] in the independent living facility. She typically ambulates with a cane as needed. She does have post-herpetic neuralgia which has affected her vision, and she feels this causes some issue with balance/ambulation. She takes aspirin daily.   Past Medical History:  Diagnosis Date   Abnormal albumin    LOW   HTN (hypertension)    Hyperlipidemia    Hypertension    Hypokalemia    Hypomagnesemia    Moderate anxiety    SERVE STRESS   Numbness on right side    SECONDARY TO ELECTROLYTES ABNORMALITIES   Past Surgical History:  Procedure Laterality Date   LUMBAR DISC SURGERY     Social History   Socioeconomic History   Marital status: Widowed    Spouse name: Not on file   Number of children: Not on file   Years of education: Not on file   Highest education level: Not on file  Occupational History   Not on file  Tobacco Use   Smoking status: Former   Smokeless tobacco: Never  Substance and Sexual Activity   Alcohol use: Yes    Alcohol/week: 0.0 standard drinks   Drug use: No   Sexual activity: Not on file  Other Topics Concern   Not on file  Social History Narrative   Lives alone in a one story home.  Has 1 daughter.  Retired  Futures trader.  Education: high school.   Social Determinants of Health   Financial Resource Strain: Not on file  Food Insecurity: Not on file  Transportation Needs: Not on file  Physical Activity: Not on file  Stress: Not on file  Social Connections: Not on file   Family History  Problem Relation Age of Onset   Other Mother 72   Cancer - Colon Father 31   Cancer Sister 68       BREAST   Allergies  Allergen Reactions   Demerol Other (See Comments)    Passed out   Iohexol      Code: RASH, Desc: Pt states she had an IVP 15-59yrs ago and broke out in a rash all over her body.  She's never had IV contrast since., Onset Date: 16109604    Suprax [Cefixime] Other (See Comments)    c diff   Penicillins Rash   Prior to Admission medications   Medication Sig Start Date End Date Taking? Authorizing Provider  amLODipine (NORVASC) 10 MG tablet Take 10 mg by mouth at bedtime. 05/14/15  Yes [provider]  aspirin EC 81 MG tablet Take 81 mg by mouth at bedtime.   Yes [provider]  BESIVANCE 0.6 % SUSP Place 1 drop  into both eyes in the morning, at noon, in the evening, and at bedtime. Starts 2 days after eye injection every 6 weeks 06/28/20  Yes [provider]  Calcium Carbonate (CALCIUM 600 PO) Take 1 tablet by mouth 2 (two) times daily.   Yes [provider]  Coenzyme Q-10 100 MG capsule Take 100 mg by mouth daily.   Yes [provider]  diclofenac Sodium (VOLTAREN) 1 % GEL Apply 4 g topically 4 (four) times daily. 11/07/20  Yes Palumbo, April, MD  estradiol (ESTRACE) 0.1 MG/GM vaginal cream Place 1 Applicatorful vaginally 3 (three) times a week. Helps prevent UTI   Yes [provider]  lidocaine (LIDODERM) 5 % Place 1 patch onto the skin daily. Remove & Discard patch within 12 hours or as directed by MD 11/07/20  Yes Palumbo, April, MD  Multiple Vitamin (MULTIVITAMIN WITH MINERALS) TABS Take 1 tablet by mouth daily.   Yes [provider]  Omega-3 1000 MG CAPS Take 1 g by mouth daily.   Yes [provider]  omeprazole (PRILOSEC OTC) 20 MG tablet Take 20 mg by mouth daily.   Yes [provider]  quinapril (ACCUPRIL) 40 MG tablet Take 40 mg by mouth daily.   Yes [provider]  rosuvastatin (CRESTOR) 10 MG tablet Take 10 mg by mouth every other day. 07/17/20  Yes [provider]  saccharomyces boulardii (FLORASTOR) 250 MG capsule Take 1 capsule by mouth every other day.   Yes [provider]   CT HEAD WO CONTRAST (5MM)  Result Date: 11/07/2020 CLINICAL DATA:  Facial trauma. EXAM: CT HEAD WITHOUT CONTRAST TECHNIQUE: Contiguous axial images were obtained from the base of the skull through the vertex without intravenous contrast. COMPARISON:  CT head 10/15/2010. FINDINGS: Brain: No evidence of acute infarction, hemorrhage, hydrocephalus, extra-axial collection or mass lesion/mass effect. There is mild patchy periventricular and deep white matter hypodensity, likely chronic small vessel ischemic change. Vascular: Atherosclerotic calcifications are present within the cavernous internal carotid arteries. Skull: Normal. Negative for fracture or focal lesion. Sinuses/Orbits: There are postsurgical changes in both globes. The visualized paranasal sinuses and mastoid air cells are clear. Other: None. IMPRESSION: 1. No acute intracranial abnormality. 2. Mild chronic small vessel ischemic change. Electronically Signed   By: Ronney Asters M.D.   On: 11/07/2020 00:01   CT PELVIS WO CONTRAST  Result Date: 11/07/2020 CLINICAL DATA:  Pelvic trauma. EXAM: CT PELVIS WITHOUT CONTRAST TECHNIQUE: Multidetector CT imaging of the pelvis was performed following the standard protocol without intravenous contrast. COMPARISON:  CT of the abdomen pelvis dated 09/21/2011. FINDINGS: Urinary Tract:  The urinary bladder is unremarkable. Bowel: Sigmoid diverticulosis without active inflammatory changes. No bowel  dilatation or inflammation in the pelvis. Vascular/Lymphatic: Advanced aortoiliac atherosclerotic disease. Adenopathy. Reproductive: The uterus is grossly unremarkable. A pessary is noted. Other:  None Musculoskeletal: There is mildly impacted fracture of the right femoral neck. No other acute fracture. The bones are osteopenic. There is no dislocation. IMPRESSION: 1. Mildly impacted fracture of the right femoral neck. 2. Aortic Atherosclerosis (ICD10-I70.0). Electronically Signed   By: Anner Crete M.D.   On: 11/07/2020 02:30   DG Knee Complete 4 Views Right  Result Date: 11/06/2020 CLINICAL DATA:  Status post fall. EXAM: RIGHT KNEE - COMPLETE 4+ VIEW COMPARISON:  None. FINDINGS: No evidence of acute fracture or dislocation. No evidence of arthropathy or other focal bone abnormality. A very small joint effusion is noted. IMPRESSION: 1. No acute fracture or dislocation. 2.  Very small joint effusion. Electronically Signed   By: Virgina Norfolk M.D.   On: 11/06/2020 23:58   DG Hip Unilat W or Wo Pelvis 2-3 Views Right  Result Date: 11/06/2020 CLINICAL DATA:  Status post fall. EXAM: DG HIP (WITH OR WITHOUT PELVIS) 2-3V RIGHT COMPARISON:  None. FINDINGS: There is no evidence of hip fracture or dislocation. Degenerative changes are seen in the form joint space narrowing and acetabular sclerosis. IMPRESSION: No acute osseous injury. Electronically Signed   By: Virgina Norfolk M.D.   On: 11/06/2020 23:57    Positive ROS: All other systems have been reviewed and were otherwise negative with the exception of those mentioned in the HPI and as above.  Physical Exam: General: Alert, no acute distress Cardiovascular: No pedal edema Respiratory: No cyanosis, no use of accessory musculature GI: No organomegaly, abdomen is soft and non-tender Skin: No lesions in the area of chief complaint Neurologic: Sensation intact distally Psychiatric: Patient is competent for consent with normal mood and  affect Lymphatic: No axillary or cervical lymphadenopathy  MUSCULOSKELETAL:  Right LE: Skin intact. No erythema or ecchymosis. ROM deferred due to pain. Sensation intact over the LE. No tenderness about the knee. No effusion. Moving her foot and toes without difficulty. Pulses intact.  Left LE: No pain with ROM of the hip or knee.   Assessment: Right femoral neck fracture  Plan: I discussed with Ms. Atienza and her daughter her diagnosis of right hip fracture and planned treatment. Dr. Lyla Glassing will plan to take her to the OR tomorrow for hemiarthroplasty vs total hip arthroplasty. Today, she may have normal diet and pain control. We will add in robaxin for muscle spasms and Norco for pain. Dr. Lyla Glassing to see patient and discuss with her either this afternoon or tomorrow AM.     Irving Copas, PA-C Cell [phone]   11/07/2020 9:25 AM

## 2020-11-07 NOTE — Progress Notes (Signed)
Patient briefly seen at the bedside this morning.  Dr. Sidney Ace  was seeing her when I entered the room.  Daughter at the bedside.  Overall patient  looks comfortable.  I agree with assessment and plan as per Dr. Sidney Ace.  Orthopedics planning for surgery either this afternoon or tomorrow a.m.

## 2020-11-07 NOTE — ED Provider Notes (Addendum)
[facility] DEPT Provider Note   CSN: [id number] Arrival date & time: 11/06/20  2301     History Chief Complaint  Patient presents with   [name] [name]    KOSHA JAQUITH is a 87 y.o. female.  The history is provided by the patient.  Fall This is a new problem. The current episode started 3 to 5 hours ago. The problem occurs constantly. The problem has not changed since onset.Pertinent negatives include no chest pain, no abdominal pain, no headaches and no shortness of breath. Nothing aggravates the symptoms. Nothing relieves the symptoms. She has tried nothing for the symptoms. The treatment provided no relief.  Paient fell onto right knee and will not walk on it.      Past Medical History:  Diagnosis Date   Abnormal albumin    LOW   HTN (hypertension)    Hyperlipidemia    Hypertension    Hypokalemia    Hypomagnesemia    Moderate anxiety    SERVE STRESS   Numbness on right side    SECONDARY TO ELECTROLYTES ABNORMALITIES    Patient Active Problem List   Diagnosis Date Noted   Chest pain 03/28/2015    Past Surgical History:  Procedure Laterality Date   LUMBAR DISC SURGERY       OB History   No obstetric history on file.     Family History  Problem Relation Age of Onset   Other Mother 3   Cancer - Colon Father 34   Cancer Sister 31       BREAST    Social History   Tobacco Use   Smoking status: Former   Smokeless tobacco: Never  Substance Use Topics   Alcohol use: Yes    Alcohol/week: 0.0 standard drinks   Drug use: No    Home Medications Prior to Admission medications   Medication Sig Start Date End Date Taking? Authorizing Provider  amLODipine (NORVASC) 10 MG tablet  05/14/15   [provider]  aspirin EC 81 MG tablet Take 81 mg by mouth daily.    [provider]  Calcium Carbonate (CALCIUM 600 PO) Take 1 tablet by mouth 2 (two) times daily.    [provider]  Coenzyme Q-10 100 MG capsule Take  100 mg by mouth daily.    [provider]  Multiple Vitamin (MULTIVITAMIN WITH MINERALS) TABS Take 1 tablet by mouth daily.    [provider]  omeprazole (PRILOSEC OTC) 20 MG tablet Take 20 mg by mouth daily.    [provider]  quinapril (ACCUPRIL) 40 MG tablet Take 40 mg by mouth at bedtime.    [provider]    Allergies    Demerol, Iohexol, Suprax [cefixime], and Penicillins  Review of Systems   Review of Systems  Constitutional:  Negative for fever.  Eyes:  Negative for redness.  Respiratory:  Negative for shortness of breath.   Cardiovascular:  Negative for chest pain.  Gastrointestinal:  Negative for abdominal pain.  Genitourinary:  Negative for difficulty urinating.  Musculoskeletal:  Positive for arthralgias.  Skin:  Negative for rash.  Neurological:  Negative for headaches.  Psychiatric/Behavioral:  Negative for agitation.   All other systems reviewed and are negative.  Physical Exam Updated Vital Signs BP (!) 171/81   Pulse 95   Temp 97.8 F (36.6 C) (Oral)   Resp 18   SpO2 95%   Physical Exam Vitals and nursing note reviewed.  Constitutional:  General: She is not in acute distress.    Appearance: Normal appearance.  HENT:     Head: Normocephalic and atraumatic.     Nose: Nose normal.  Eyes:     Conjunctiva/sclera: Conjunctivae normal.     Pupils: Pupils are equal, round, and reactive to light.  Cardiovascular:     Rate and Rhythm: Normal rate and regular rhythm.     Pulses: Normal pulses.     Heart sounds: Normal heart sounds.  Pulmonary:     Effort: Pulmonary effort is normal.     Breath sounds: Normal breath sounds.  Abdominal:     General: Abdomen is flat. Bowel sounds are normal.     Palpations: Abdomen is soft.     Tenderness: There is no abdominal tenderness. There is no guarding.  Musculoskeletal:        General: No deformity.     Cervical back: Normal range of motion and neck supple.     Right knee:  Normal. No LCL laxity, MCL laxity or ACL laxity. Normal patellar mobility.     Instability Tests: Anterior drawer test negative. Posterior drawer test negative.     Left knee: Normal. No LCL laxity, MCL laxity or ACL laxity.Normal patellar mobility.     Instability Tests: Anterior drawer test negative. Posterior drawer test negative.     Right ankle: Normal.     Right Achilles Tendon: Normal.     Left ankle: Normal.     Left Achilles Tendon: Normal.  Skin:    General: Skin is warm and dry.     Capillary Refill: Capillary refill takes less than 2 seconds.  Neurological:     General: No focal deficit present.     Mental Status: She is alert.     Deep Tendon Reflexes: Reflexes normal.  Psychiatric:        Mood and Affect: Mood normal.        Behavior: Behavior normal.    ED Results / Procedures / Treatments   Labs (all labs ordered are listed, but only abnormal results are displayed) Labs Reviewed - No data to display  EKG None  Radiology CT HEAD WO CONTRAST (5MM)  Result Date: 11/07/2020 CLINICAL DATA:  Facial trauma. EXAM: CT HEAD WITHOUT CONTRAST TECHNIQUE: Contiguous axial images were obtained from the base of the skull through the vertex without intravenous contrast. COMPARISON:  CT head 10/15/2010. FINDINGS: Brain: No evidence of acute infarction, hemorrhage, hydrocephalus, extra-axial collection or mass lesion/mass effect. There is mild patchy periventricular and deep white matter hypodensity, likely chronic small vessel ischemic change. Vascular: Atherosclerotic calcifications are present within the cavernous internal carotid arteries. Skull: Normal. Negative for fracture or focal lesion. Sinuses/Orbits: There are postsurgical changes in both globes. The visualized paranasal sinuses and mastoid air cells are clear. Other: None. IMPRESSION: 1. No acute intracranial abnormality. 2. Mild chronic small vessel ischemic change. Electronically Signed   By: Ronney Asters M.D.   On:  11/07/2020 00:01   DG Knee Complete 4 Views Right  Result Date: 11/06/2020 CLINICAL DATA:  Status post fall. EXAM: RIGHT KNEE - COMPLETE 4+ VIEW COMPARISON:  None. FINDINGS: No evidence of acute fracture or dislocation. No evidence of arthropathy or other focal bone abnormality. A very small joint effusion is noted. IMPRESSION: 1. No acute fracture or dislocation. 2. Very small joint effusion. Electronically Signed   By: Virgina Norfolk M.D.   On: 11/06/2020 23:58   DG Hip Unilat W or Wo Pelvis 2-3 Views Right  Result Date: 11/06/2020 CLINICAL DATA:  Status post fall. EXAM: DG HIP (WITH OR WITHOUT PELVIS) 2-3V RIGHT COMPARISON:  None. FINDINGS: There is no evidence of hip fracture or dislocation. Degenerative changes are seen in the form joint space narrowing and acetabular sclerosis. IMPRESSION: No acute osseous injury. Electronically Signed   By: Virgina Norfolk M.D.   On: 11/06/2020 23:57    Procedures Procedures   Medications Ordered in ED Medications  sodium chloride 0.9 % bolus 1,000 mL (1,000 mLs Intravenous Patient Refused/Not Given 11/07/20 0006)  acetaminophen (TYLENOL) tablet 1,000 mg (1,000 mg Oral Patient Refused/Not Given 11/07/20 0006)    ED Course  I have reviewed the triage vital signs and the nursing notes.  Pertinent labs & imaging results that were available during my care of the patient were reviewed by me and considered in my medical decision making (see chart for details).   MALAK DUCHESNEAU was evaluated in Emergency Department on 11/07/2020 for the symptoms described in the history of present illness. She was evaluated in the context of the global COVID-19 pandemic, which necessitated consideration that the patient might be at risk for infection with the SARS-CoV-2 virus that causes COVID-19. Institutional protocols and algorithms that pertain to the evaluation of patients at risk for COVID-19 are in a state of rapid change based on information released by regulatory  bodies including the CDC and federal and state organizations. These policies and algorithms were followed during the patient's care in the ED.  Case d/w DrAlvan Dame, hold ASA, keep NPO  Final Clinical Impression(s) / ED Diagnoses Final diagnoses:  Fall, initial encounter  Effusion of right knee   Rx / DC Orders ED Discharge Orders     None        Woodrow Dulski, MD 11/07/20 0405    Kandie Keiper, MD 11/07/20 9924

## 2020-11-07 NOTE — H&P (Addendum)
[place]   PATIENT [name] [name]    MR#:  [id number]  DATE OF BIRTH:  04-08-1929  DATE OF ADMISSION:  11/06/2020  PRIMARY CARE PHYSICIAN: Lajean Manes, MD   Patient is coming from: Home  REQUESTING/REFERRING PHYSICIAN: [place], April, MD  CHIEF COMPLAINT:   Chief Complaint  Patient presents with   Fall    HISTORY OF PRESENT ILLNESS:  [name] [name] is a 87 y.o. Caucasian female with medical history significant for hypertension, dyslipidemia, and anxiety, who presented to the emergency room with acute onset of mechanical fall when she was getting up from her chair and lost balance falling on her right knee with subsequent inability to ambulate.  She could not bear weight on her knee and apparently did it on her hip.  The patient denies any presyncope or syncope.  No paresthesias or focal muscle weakness.  No headache or dizziness or blurred vision.  She denies any chest pain or palpitations.  No dysuria, oliguria or hematuria or flank pain. ED Course: When she came to the ER blood pressure was 179/86 with otherwise normal vital signs.  Labs revealed hypokalemia hyponatremia as well as hypochloremia and CBC showed leukocytosis of 16.2.  COVID-19 PCR is currently pending.  Imaging: Noncontrasted head CT scan revealed no acute intracranial normality and mild chronic small vessel ischemic change.  Knee x-ray showed very small joint effusion with no acute fracture or dislocation. Pelvic CT showed mild impacted fracture of the right femoral neck and aortic atherosclerosis.  Contact was made with Dr. Alvan Dame who recommended keeping her n.p.o. and holding her aspirin.  The patient will be admitted to a medical bed for further evaluation and management. Past Medical History:  Diagnosis Date   Abnormal albumin    LOW   HTN (hypertension)    Hyperlipidemia    Hypertension    Hypokalemia    Hypomagnesemia    Moderate anxiety    SERVE STRESS   Numbness on right side     SECONDARY TO ELECTROLYTES ABNORMALITIES    PAST SURGICAL HISTORY:   Past Surgical History:  Procedure Laterality Date   LUMBAR DISC SURGERY      SOCIAL HISTORY:   Social History   Tobacco Use   Smoking status: Former   Smokeless tobacco: Never  Substance Use Topics   Alcohol use: Yes    Alcohol/week: 0.0 standard drinks    FAMILY HISTORY:   Family History  Problem Relation Age of Onset   Other Mother 58   Cancer - Colon Father 94   Cancer Sister 68       BREAST    DRUG ALLERGIES:   Allergies  Allergen Reactions   Demerol Other (See Comments)    Passed out   Iohexol      Code: RASH, Desc: Pt states she had an IVP 15-21yrs ago and broke out in a rash all over her body.  She's never had IV contrast since., Onset Date: [id number]    Suprax [Cefixime] Other (See Comments)    c diff   Penicillins Rash    REVIEW OF SYSTEMS:   ROS As per history of present illness. All pertinent systems were reviewed above. Constitutional, HEENT, cardiovascular, respiratory, GI, GU, musculoskeletal, neuro, psychiatric, endocrine, integumentary and hematologic systems were reviewed and are otherwise negative/unremarkable except for positive findings mentioned above in the HPI.   MEDICATIONS AT HOME:   Prior to Admission medications   Medication Sig Start Date End Date Taking?  Authorizing Provider  amLODipine (NORVASC) 10 MG tablet Take 10 mg by mouth at bedtime. 05/14/15  Yes [provider]  aspirin EC 81 MG tablet Take 81 mg by mouth at bedtime.   Yes [provider]  BESIVANCE 0.6 % SUSP Place 1 drop into both eyes in the morning, at noon, in the evening, and at bedtime. Starts 2 days after eye injection every 6 weeks 06/28/20  Yes [provider]  Calcium Carbonate (CALCIUM 600 PO) Take 1 tablet by mouth 2 (two) times daily.   Yes [provider]  Coenzyme Q-10 100 MG capsule Take 100 mg by mouth daily.   Yes [provider]  diclofenac  Sodium (VOLTAREN) 1 % GEL Apply 4 g topically 4 (four) times daily. 11/07/20  Yes Palumbo, April, MD  estradiol (ESTRACE) 0.1 MG/GM vaginal cream Place 1 Applicatorful vaginally 3 (three) times a week. Helps prevent UTI   Yes [provider]  lidocaine (LIDODERM) 5 % Place 1 patch onto the skin daily. Remove & Discard patch within 12 hours or as directed by MD 11/07/20  Yes Palumbo, April, MD  Multiple Vitamin (MULTIVITAMIN WITH MINERALS) TABS Take 1 tablet by mouth daily.   Yes [provider]  Omega-3 1000 MG CAPS Take 1 g by mouth daily.   Yes [provider]  omeprazole (PRILOSEC OTC) 20 MG tablet Take 20 mg by mouth daily.   Yes [provider]  quinapril (ACCUPRIL) 40 MG tablet Take 40 mg by mouth daily.   Yes [provider]  rosuvastatin (CRESTOR) 10 MG tablet Take 10 mg by mouth every other day. 07/17/20  Yes [provider]  saccharomyces boulardii (FLORASTOR) 250 MG capsule Take 1 capsule by mouth every other day.   Yes [provider]      VITAL SIGNS:  Blood pressure (!) 159/69, pulse 87, temperature 98.4 F (36.9 C), temperature source Oral, resp. rate 16, height 5\' 4"  (1.626 m), weight 52.6 kg, SpO2 96 %.  PHYSICAL EXAMINATION:  Physical Exam  GENERAL:  87 y.o.-year-old Caucasian female patient lying in the bed with no acute distress.  EYES: Pupils equal, round, reactive to light and accommodation. No scleral icterus. Extraocular muscles intact.  HEENT: Head atraumatic, normocephalic. Oropharynx and nasopharynx clear.  NECK:  Supple, no jugular venous distention. No thyroid enlargement, no tenderness.  LUNGS: Normal breath sounds bilaterally, no wheezing, rales,rhonchi or crepitation. No use of accessory muscles of respiration.  CARDIOVASCULAR: Regular rate and rhythm, S1, S2 normal. No murmurs, rubs, or gallops.  ABDOMEN: Soft, nondistended, nontender. Bowel sounds present. No organomegaly or mass.  EXTREMITIES: No  pedal edema, cyanosis, or clubbing.  NEUROLOGIC: Cranial nerves II through XII are intact. Muscle strength 5/5 in all extremities. Sensation intact. Gait not checked. Musculoskeletal: Mild lateral hip tenderness.  Right knee abrasion and mild swelling with effusion. PSYCHIATRIC: The patient is alert and oriented x 3.  Normal affect and good eye contact. SKIN: Geographic chronic spider varicosities with no other rash, lesion, or ulcer.    LABORATORY PANEL:   CBC Recent Labs  Lab 11/07/20 0259  WBC 16.2*  HGB 14.4  HCT 42.8  PLT 271   ------------------------------------------------------------------------------------------------------------------  Chemistries  Recent Labs  Lab 11/07/20 0259  NA 130*  K 3.4*  CL 94*  CO2 24  GLUCOSE 132*  BUN 11  CREATININE 0.48  CALCIUM 9.7   ------------------------------------------------------------------------------------------------------------------  Cardiac Enzymes No results for input(s): TROPONINI in the last 168 hours. ------------------------------------------------------------------------------------------------------------------  RADIOLOGY:  CT HEAD WO CONTRAST (5MM)  Result Date: 11/07/2020 CLINICAL DATA:  Facial trauma. EXAM: CT HEAD WITHOUT CONTRAST TECHNIQUE: Contiguous axial images were obtained from the base of the skull through the vertex without intravenous contrast. COMPARISON:  CT head 10/15/2010. FINDINGS: Brain: No evidence of acute infarction, hemorrhage, hydrocephalus, extra-axial collection or mass lesion/mass effect. There is mild patchy periventricular and deep white matter hypodensity, likely chronic small vessel ischemic change. Vascular: Atherosclerotic calcifications are present within the cavernous internal carotid arteries. Skull: Normal. Negative for fracture or focal lesion. Sinuses/Orbits: There are postsurgical changes in both globes. The visualized paranasal sinuses and mastoid air cells are clear.  Other: None. IMPRESSION: 1. No acute intracranial abnormality. 2. Mild chronic small vessel ischemic change. Electronically Signed   By: Ronney Asters M.D.   On: 11/07/2020 00:01   CT PELVIS WO CONTRAST  Result Date: 11/07/2020 CLINICAL DATA:  Pelvic trauma. EXAM: CT PELVIS WITHOUT CONTRAST TECHNIQUE: Multidetector CT imaging of the pelvis was performed following the standard protocol without intravenous contrast. COMPARISON:  CT of the abdomen pelvis dated 09/21/2011. FINDINGS: Urinary Tract:  The urinary bladder is unremarkable. Bowel: Sigmoid diverticulosis without active inflammatory changes. No bowel dilatation or inflammation in the pelvis. Vascular/Lymphatic: Advanced aortoiliac atherosclerotic disease. Adenopathy. Reproductive: The uterus is grossly unremarkable. A pessary is noted. Other:  None Musculoskeletal: There is mildly impacted fracture of the right femoral neck. No other acute fracture. The bones are osteopenic. There is no dislocation. IMPRESSION: 1. Mildly impacted fracture of the right femoral neck. 2. Aortic Atherosclerosis (ICD10-I70.0). Electronically Signed   By: Anner Crete M.D.   On: 11/07/2020 02:30   DG Knee Complete 4 Views Right  Result Date: 11/06/2020 CLINICAL DATA:  Status post fall. EXAM: RIGHT KNEE - COMPLETE 4+ VIEW COMPARISON:  None. FINDINGS: No evidence of acute fracture or dislocation. No evidence of arthropathy or other focal bone abnormality. A very small joint effusion is noted. IMPRESSION: 1. No acute fracture or dislocation. 2. Very small joint effusion. Electronically Signed   By: Virgina Norfolk M.D.   On: 11/06/2020 23:58   DG Hip Unilat W or Wo Pelvis 2-3 Views Right  Result Date: 11/06/2020 CLINICAL DATA:  Status post fall. EXAM: DG HIP (WITH OR WITHOUT PELVIS) 2-3V RIGHT COMPARISON:  None. FINDINGS: There is no evidence of hip fracture or dislocation. Degenerative changes are seen in the form joint space narrowing and acetabular sclerosis.  IMPRESSION: No acute osseous injury. Electronically Signed   By: Virgina Norfolk M.D.   On: 11/06/2020 23:57      IMPRESSION AND PLAN:  Active Problems:   Closed right hip fracture (HCC)  1- Closed impacted right hip/femoral neck fracture secondary to mechanical fall. - The patient will be admitted to a medical bed. - Pain management will be provided. - She will be kept NPO. - Orthopedic consultation will be obtained. - Dr. Alvan Dame with [facility] was notified about the patient.  2.  Essential hypertension. - We will continue Norvasc Accupril and HCTZ.  3.  GERD. - PPI therapy will be resumed.  4.  Dyslipidemia. - We will continue PPI therapy   DVT prophylaxis: SCDs. Code Status: full code.  Family Communication:  The plan of care was discussed in details with the patient (and family). I answered all questions. The patient agreed to proceed with the above mentioned plan. Further management will depend upon hospital course. Disposition Plan: Back to previous home environment Consults called: Orthopedic consult.  All the records are reviewed and  case discussed with ED provider.  Status is: Inpatient  Remains inpatient appropriate because:Ongoing active pain requiring inpatient pain management, Ongoing diagnostic testing needed not appropriate for outpatient work up, Unsafe d/c plan, IV treatments appropriate due to intensity of illness or inability to take PO, and Inpatient level of care appropriate due to severity of illness  Dispo: The patient is from: Home              Anticipated d/c is to: SNF              Patient currently is not medically stable to d/c.   Difficult to place patient No      TOTAL TIME TAKING CARE OF THIS PATIENT: 55 minutes.    Christel Mormon M.D on 11/07/2020 at 8:22 AM  [facility]   From 7 PM-7 AM, contact night-coverage [email or web address]  CC: Primary care physician; Lajean Manes, MD

## 2020-11-07 NOTE — Consult Note (Signed)
ORTHOPAEDIC CONSULTATION  REQUESTING PHYSICIAN: Shelly Coss, MD  PCP:  Lajean Manes, MD  Chief Complaint: Fall with right hip & knee pain   HPI: [name] [name] is a 87 y.o. female who presented to [facility] ED yesterday after a fall at home. She had gone on her evening walk, and then gotten ready for bed, and when she went to stand up from her recliner her hip gave out causing her to fall. She reports she was unable to get up, but was able to call the staff at her facility and her family to help her. In the ED, CT revealed right femoral neck fracture. Orthopaedics was consulted for evaluation and management.   Today, she is resting in bed with her daughter, [name] [name], at the bedside. She reports she is normally very active. She lives at [facility] in the independent living facility. She typically ambulates with a cane as needed. She does have post-herpetic neuralgia which has affected her vision, and she feels this causes some issue with balance/ambulation. She takes aspirin daily.   Past Medical History:  Diagnosis Date   Abnormal albumin    LOW   HTN (hypertension)    Hyperlipidemia    Hypertension    Hypokalemia    Hypomagnesemia    Moderate anxiety    SERVE STRESS   Numbness on right side    SECONDARY TO ELECTROLYTES ABNORMALITIES   Past Surgical History:  Procedure Laterality Date   LUMBAR DISC SURGERY     Social History   Socioeconomic History   Marital status: Widowed    Spouse name: Not on file   Number of children: Not on file   Years of education: Not on file   Highest education level: Not on file  Occupational History   Not on file  Tobacco Use   Smoking status: Former   Smokeless tobacco: Never  Substance and Sexual Activity   Alcohol use: Yes    Alcohol/week: 0.0 standard drinks   Drug use: No   Sexual activity: Not on file  Other Topics Concern   Not on file  Social History Narrative   Lives alone in a one story home.  Has 1 daughter.  Retired  Futures trader.  Education: high school.   Social Determinants of Health   Financial Resource Strain: Not on file  Food Insecurity: Not on file  Transportation Needs: Not on file  Physical Activity: Not on file  Stress: Not on file  Social Connections: Not on file   Family History  Problem Relation Age of Onset   Other Mother 81   Cancer - Colon Father 48   Cancer Sister 84       BREAST   Allergies  Allergen Reactions   Demerol Other (See Comments)    Passed out   Iohexol      Code: RASH, Desc: Pt states she had an IVP 15-45yrs ago and broke out in a rash all over her body.  She's never had IV contrast since., Onset Date: [id number]    Suprax [Cefixime] Other (See Comments)    c diff   Penicillins Rash   Prior to Admission medications   Medication Sig Start Date End Date Taking? Authorizing Provider  amLODipine (NORVASC) 10 MG tablet Take 10 mg by mouth at bedtime. 05/14/15  Yes [provider]  aspirin EC 81 MG tablet Take 81 mg by mouth at bedtime.   Yes [provider]  BESIVANCE 0.6 % SUSP Place 1 drop  into both eyes in the morning, at noon, in the evening, and at bedtime. Starts 2 days after eye injection every 6 weeks 06/28/20  Yes [provider]  Calcium Carbonate (CALCIUM 600 PO) Take 1 tablet by mouth 2 (two) times daily.   Yes [provider]  Coenzyme Q-10 100 MG capsule Take 100 mg by mouth daily.   Yes [provider]  diclofenac Sodium (VOLTAREN) 1 % GEL Apply 4 g topically 4 (four) times daily. 11/07/20  Yes Palumbo, April, MD  estradiol (ESTRACE) 0.1 MG/GM vaginal cream Place 1 Applicatorful vaginally 3 (three) times a week. Helps prevent UTI   Yes [provider]  lidocaine (LIDODERM) 5 % Place 1 patch onto the skin daily. Remove & Discard patch within 12 hours or as directed by MD 11/07/20  Yes Palumbo, April, MD  Multiple Vitamin (MULTIVITAMIN WITH MINERALS) TABS Take 1 tablet by mouth daily.   Yes [provider]  Omega-3 1000 MG CAPS Take 1 g by mouth daily.   Yes [provider]  omeprazole (PRILOSEC OTC) 20 MG tablet Take 20 mg by mouth daily.   Yes [provider]  quinapril (ACCUPRIL) 40 MG tablet Take 40 mg by mouth daily.   Yes [provider]  rosuvastatin (CRESTOR) 10 MG tablet Take 10 mg by mouth every other day. 07/17/20  Yes [provider]  saccharomyces boulardii (FLORASTOR) 250 MG capsule Take 1 capsule by mouth every other day.   Yes [provider]   CT HEAD WO CONTRAST (5MM)  Result Date: 11/07/2020 CLINICAL DATA:  Facial trauma. EXAM: CT HEAD WITHOUT CONTRAST TECHNIQUE: Contiguous axial images were obtained from the base of the skull through the vertex without intravenous contrast. COMPARISON:  CT head 10/15/2010. FINDINGS: Brain: No evidence of acute infarction, hemorrhage, hydrocephalus, extra-axial collection or mass lesion/mass effect. There is mild patchy periventricular and deep white matter hypodensity, likely chronic small vessel ischemic change. Vascular: Atherosclerotic calcifications are present within the cavernous internal carotid arteries. Skull: Normal. Negative for fracture or focal lesion. Sinuses/Orbits: There are postsurgical changes in both globes. The visualized paranasal sinuses and mastoid air cells are clear. Other: None. IMPRESSION: 1. No acute intracranial abnormality. 2. Mild chronic small vessel ischemic change. Electronically Signed   By: Ronney Asters M.D.   On: 11/07/2020 00:01   CT PELVIS WO CONTRAST  Result Date: 11/07/2020 CLINICAL DATA:  Pelvic trauma. EXAM: CT PELVIS WITHOUT CONTRAST TECHNIQUE: Multidetector CT imaging of the pelvis was performed following the standard protocol without intravenous contrast. COMPARISON:  CT of the abdomen pelvis dated 09/21/2011. FINDINGS: Urinary Tract:  The urinary bladder is unremarkable. Bowel: Sigmoid diverticulosis without active inflammatory changes. No bowel  dilatation or inflammation in the pelvis. Vascular/Lymphatic: Advanced aortoiliac atherosclerotic disease. Adenopathy. Reproductive: The uterus is grossly unremarkable. A pessary is noted. Other:  None Musculoskeletal: There is mildly impacted fracture of the right femoral neck. No other acute fracture. The bones are osteopenic. There is no dislocation. IMPRESSION: 1. Mildly impacted fracture of the right femoral neck. 2. Aortic Atherosclerosis (ICD10-I70.0). Electronically Signed   By: Anner Crete M.D.   On: 11/07/2020 02:30   DG Knee Complete 4 Views Right  Result Date: 11/06/2020 CLINICAL DATA:  Status post fall. EXAM: RIGHT KNEE - COMPLETE 4+ VIEW COMPARISON:  None. FINDINGS: No evidence of acute fracture or dislocation. No evidence of arthropathy or other focal bone abnormality. A very small joint effusion is noted. IMPRESSION: 1. No acute fracture or dislocation. 2.  Very small joint effusion. Electronically Signed   By: Virgina Norfolk M.D.   On: 11/06/2020 23:58   DG Hip Unilat W or Wo Pelvis 2-3 Views Right  Result Date: 11/06/2020 CLINICAL DATA:  Status post fall. EXAM: DG HIP (WITH OR WITHOUT PELVIS) 2-3V RIGHT COMPARISON:  None. FINDINGS: There is no evidence of hip fracture or dislocation. Degenerative changes are seen in the form joint space narrowing and acetabular sclerosis. IMPRESSION: No acute osseous injury. Electronically Signed   By: Virgina Norfolk M.D.   On: 11/06/2020 23:57    Positive ROS: All other systems have been reviewed and were otherwise negative with the exception of those mentioned in the HPI and as above.  Physical Exam: General: Alert, no acute distress Cardiovascular: No pedal edema Respiratory: No cyanosis, no use of accessory musculature GI: No organomegaly, abdomen is soft and non-tender Skin: No lesions in the area of chief complaint Neurologic: Sensation intact distally Psychiatric: Patient is competent for consent with normal mood and  affect Lymphatic: No axillary or cervical lymphadenopathy  MUSCULOSKELETAL:  Right LE: Skin intact. No erythema or ecchymosis. ROM deferred due to pain. Sensation intact over the LE. No tenderness about the knee. No effusion. Moving her foot and toes without difficulty. Pulses intact.  Left LE: No pain with ROM of the hip or knee.   Assessment: Right femoral neck fracture  Plan: I discussed with Ms. Iseminger and her daughter her diagnosis of right hip fracture and planned treatment. Dr. Lyla Glassing will plan to take her to the OR tomorrow for hemiarthroplasty vs total hip arthroplasty. Today, she may have normal diet and pain control. We will add in robaxin for muscle spasms and Norco for pain. Dr. Lyla Glassing to see patient and discuss with her either this afternoon or tomorrow AM.     Irving Copas, PA-C Cell [phone]   11/07/2020 9:25 AM

## 2020-11-07 NOTE — Anesthesia Preprocedure Evaluation (Addendum)
Anesthesia Evaluation  Patient identified by MRN, date of birth, ID band Patient awake    Reviewed: Allergy & Precautions, NPO status , Patient's Chart, lab work & pertinent test results  Airway Mallampati: I  TM Distance: >3 FB Neck ROM: Full    Dental no notable dental hx. (+) Teeth Intact, Dental Advisory Given   Pulmonary former smoker,    Pulmonary exam normal breath sounds clear to auscultation       Cardiovascular hypertension, Pt. on medications Normal cardiovascular exam Rhythm:Regular Rate:Normal     Neuro/Psych PSYCHIATRIC DISORDERS Anxiety negative neurological ROS     GI/Hepatic Neg liver ROS, GERD  Medicated and Controlled,  Endo/Other  Chronic hyponatremia, hypokalemia Na 130, K 3.4  Renal/GU negative Renal ROS  negative genitourinary   Musculoskeletal Right femoral neck fx prior lumbar surgery    Abdominal   Peds  Hematology negative hematology ROS (+) hct 42.8, plt 271   Anesthesia Other Findings   Reproductive/Obstetrics negative OB ROS                            Anesthesia Physical Anesthesia Plan  ASA: 3  Anesthesia Plan: Spinal and MAC   Post-op Pain Management:    Induction:   PONV Risk Score and Plan: 2 and Propofol infusion and TIVA  Airway Management Planned: Natural Airway and Nasal Cannula  Additional Equipment: None  Intra-op Plan:   Post-operative Plan:   Informed Consent: I have reviewed the patients History and Physical, chart, labs and discussed the procedure including the risks, benefits and alternatives for the proposed anesthesia with the patient or authorized representative who has indicated his/her understanding and acceptance.     Dental advisory given  Plan Discussed with: CRNA  Anesthesia Plan Comments:        Anesthesia Quick Evaluation

## 2020-11-07 NOTE — ED Notes (Signed)
ED TO INPATIENT HANDOFF REPORT  Name/Age/Gender [name] [name] 87 y.o. female  Code Status   Home/SNF/Other Home  Chief Complaint Closed right hip fracture ([place]) [S72.001A]  Level of Care/Admitting Diagnosis ED Disposition     ED Disposition  Admit   Condition  --   [place]: [facility] [[id number]]  Level of Care: Med-Surg [16]  May admit patient to [facility] or [facility] if equivalent level of care is available:: No  Covid Evaluation: Asymptomatic Screening Protocol (No Symptoms)  Diagnosis: Closed right hip fracture [facility]) [[id number]]  Admitting Physician: Christel Mormon [[id number]]  Attending Physician: Christel Mormon [[id number]]  Estimated length of stay: 3 - 4 days  Certification:: I certify this patient will need inpatient services for at least 2 midnights          Medical History Past Medical History:  Diagnosis Date   Abnormal albumin    LOW   HTN (hypertension)    Hyperlipidemia    Hypertension    Hypokalemia    Hypomagnesemia    Moderate anxiety    SERVE STRESS   Numbness on right side    SECONDARY TO ELECTROLYTES ABNORMALITIES    Allergies Allergies  Allergen Reactions   Demerol Other (See Comments)    Passed out   Iohexol      Code: RASH, Desc: Pt states she had an IVP 15-17yrs ago and broke out in a rash all over her body.  She's never had IV contrast since., Onset Date: [id number]    Suprax [Cefixime] Other (See Comments)    c diff   Penicillins Rash    IV Location/Drains/Wounds Patient Lines/Drains/Airways Status     Active Line/Drains/Airways     Name Placement date Placement time Site Days   Peripheral IV 11/07/20 20 G Left Antecubital 11/07/20  0304  Antecubital  less than 1            Labs/Imaging Results for orders placed or performed during the hospital encounter of 11/06/20 (from the past 48 hour(s))  CBC     Status: Abnormal   Collection Time: 11/07/20  2:59 AM  Result Value Ref Range    WBC 16.2 (H) 4.0 - 10.5 K/uL   RBC 4.97 3.87 - 5.11 MIL/uL   Hemoglobin 14.4 12.0 - 15.0 g/dL   HCT 42.8 36.0 - 46.0 %   MCV 86.1 80.0 - 100.0 fL   MCH 29.0 26.0 - 34.0 pg   MCHC 33.6 30.0 - 36.0 g/dL   RDW 13.1 11.5 - 15.5 %   Platelets 271 150 - 400 K/uL   nRBC 0.0 0.0 - 0.2 %    Comment: Performed at [facility], [place] [address] [place], [place] [address]  Basic metabolic panel     Status: Abnormal   Collection Time: 11/07/20  2:59 AM  Result Value Ref Range   Sodium 130 (L) 135 - 145 mmol/L   Potassium 3.4 (L) 3.5 - 5.1 mmol/L   Chloride 94 (L) 98 - 111 mmol/L   CO2 24 22 - 32 mmol/L   Glucose, Bld 132 (H) 70 - 99 mg/dL    Comment: Glucose reference range applies only to samples taken after fasting for at least 8 hours.   BUN 11 8 - 23 mg/dL   Creatinine, Ser 0.48 0.44 - 1.00 mg/dL   Calcium 9.7 8.9 - 10.3 mg/dL   GFR, Estimated >60 >60 mL/min    Comment: (NOTE) Calculated using the  CKD-EPI Creatinine Equation (2021)    Anion gap 12 5 - 15    Comment: Performed at [facility], [place] [address] [place], [place] [address]  Protime-INR     Status: None   Collection Time: 11/07/20  2:59 AM  Result Value Ref Range   Prothrombin Time 13.1 11.4 - 15.2 seconds   INR 1.0 0.8 - 1.2    Comment: (NOTE) INR goal varies based on device and disease states. Performed at [facility], [place] [address], [place], [place] [address]    CT HEAD WO CONTRAST (5MM)  Result Date: 11/07/2020 CLINICAL DATA:  Facial trauma. EXAM: CT HEAD WITHOUT CONTRAST TECHNIQUE: Contiguous axial images were obtained from the base of the skull through the vertex without intravenous contrast. COMPARISON:  CT head 10/15/2010. FINDINGS: Brain: No evidence of acute infarction, hemorrhage, hydrocephalus, extra-axial collection or mass lesion/mass effect. There is mild patchy periventricular and deep white matter hypodensity, likely chronic small vessel ischemic  change. Vascular: Atherosclerotic calcifications are present within the cavernous internal carotid arteries. Skull: Normal. Negative for fracture or focal lesion. Sinuses/Orbits: There are postsurgical changes in both globes. The visualized paranasal sinuses and mastoid air cells are clear. Other: None. IMPRESSION: 1. No acute intracranial abnormality. 2. Mild chronic small vessel ischemic change. Electronically Signed   By: Ronney Asters M.D.   On: 11/07/2020 00:01   CT PELVIS WO CONTRAST  Result Date: 11/07/2020 CLINICAL DATA:  Pelvic trauma. EXAM: CT PELVIS WITHOUT CONTRAST TECHNIQUE: Multidetector CT imaging of the pelvis was performed following the standard protocol without intravenous contrast. COMPARISON:  CT of the abdomen pelvis dated 09/21/2011. FINDINGS: Urinary Tract:  The urinary bladder is unremarkable. Bowel: Sigmoid diverticulosis without active inflammatory changes. No bowel dilatation or inflammation in the pelvis. Vascular/Lymphatic: Advanced aortoiliac atherosclerotic disease. Adenopathy. Reproductive: The uterus is grossly unremarkable. A pessary is noted. Other:  None Musculoskeletal: There is mildly impacted fracture of the right femoral neck. No other acute fracture. The bones are osteopenic. There is no dislocation. IMPRESSION: 1. Mildly impacted fracture of the right femoral neck. 2. Aortic Atherosclerosis (ICD10-I70.0). Electronically Signed   By: Anner Crete M.D.   On: 11/07/2020 02:30   DG Knee Complete 4 Views Right  Result Date: 11/06/2020 CLINICAL DATA:  Status post fall. EXAM: RIGHT KNEE - COMPLETE 4+ VIEW COMPARISON:  None. FINDINGS: No evidence of acute fracture or dislocation. No evidence of arthropathy or other focal bone abnormality. A very small joint effusion is noted. IMPRESSION: 1. No acute fracture or dislocation. 2. Very small joint effusion. Electronically Signed   By: Virgina Norfolk M.D.   On: 11/06/2020 23:58   DG Hip Unilat W or Wo Pelvis 2-3 Views  Right  Result Date: 11/06/2020 CLINICAL DATA:  Status post fall. EXAM: DG HIP (WITH OR WITHOUT PELVIS) 2-3V RIGHT COMPARISON:  None. FINDINGS: There is no evidence of hip fracture or dislocation. Degenerative changes are seen in the form joint space narrowing and acetabular sclerosis. IMPRESSION: No acute osseous injury. Electronically Signed   By: Virgina Norfolk M.D.   On: 11/06/2020 23:57    Pending Labs Unresulted Labs (From admission, onward)     Start     Ordered   11/07/20 0256  SARS CORONAVIRUS 2 (TAT 6-24 HRS) Nasopharyngeal Nasopharyngeal Swab  (Tier 3 - Symptomatic/asymptomatic)  Once,   STAT       Question Answer Comment  Is this test for diagnosis or screening Screening   Symptomatic for COVID-19 as defined  by CDC No   Hospitalized for COVID-19 No   Admitted to ICU for COVID-19 No   Previously tested for COVID-19 No   Resident in a congregate (group) care setting No   Employed in healthcare setting No   Pregnant No   Has patient completed COVID vaccination(s) (2 doses of Pfizer/Moderna 1 dose of [employer]) Yes   Has patient completed COVID Booster / 3rd dose Unknown      11/07/20 0256            Vitals/Pain Today's Vitals   11/07/20 0130 11/07/20 0230 11/07/20 0230 11/07/20 0330  BP: (!) 168/79 (!) 120/98  140/85  Pulse: 99 95  94  Resp: 17 18  18   Temp:      TempSrc:      SpO2: 98% 95%  94%  PainSc:   3      Isolation Precautions No active isolations  Medications Medications  sodium chloride 0.9 % bolus 1,000 mL (1,000 mLs Intravenous Patient Refused/Not Given 11/07/20 0006)  lidocaine (LIDODERM) 5 % 3 patch (3 patches Transdermal Patch Applied 11/07/20 0144)  acetaminophen (TYLENOL) tablet 1,000 mg (1,000 mg Oral Given 11/07/20 0142)  naproxen (NAPROSYN) tablet 250 mg (250 mg Oral Given 11/07/20 0229)    Mobility Walks normally but not since fall

## 2020-11-07 NOTE — Discharge Instructions (Addendum)
? ?Dr. Jaylise Peek ?Joint Replacement Specialist ?[facility] ?[address] ?[place], [place] [address] ?[phone] ? ? ?TOTAL HIP REPLACEMENT POSTOPERATIVE DIRECTIONS ? ? ? ?Hip Rehabilitation, Guidelines Following Surgery  ? ?WEIGHT BEARING ?Weight bearing as tolerated with assist device (walker, cane, etc) as directed, use it as long as suggested by your surgeon or therapist, typically at least 4-6 weeks. ? ?The results of a hip operation are greatly improved after range of motion and muscle strengthening exercises. Follow all safety measures which are given to protect your hip. If any of these exercises cause increased pain or swelling in your joint, decrease the amount until you are comfortable again. Then slowly increase the exercises. Call your caregiver if you have problems or questions.  ? ?HOME CARE INSTRUCTIONS  ?Most of the following instructions are designed to prevent the dislocation of your new hip.  ?Remove items at home which could result in a fall. This includes throw rugs or furniture in walking pathways.  ?Continue medications as instructed at time of discharge. ?You may have some home medications which will be placed on hold until you complete the course of blood thinner medication. ?You may start showering once you are discharged home. Do not remove your dressing. ?Do not put on socks or shoes without following the instructions of your caregivers.   ?Sit on chairs with arms. Use the chair arms to help push yourself up when arising.  ?Arrange for the use of a toilet seat elevator so you are not sitting low.  ?Walk with walker as instructed.  ?You may resume a sexual relationship in one month or when given the OK by your caregiver.  ?Use walker as long as suggested by your caregivers.  ?You may put full weight on your legs and walk as much as is comfortable. ?Avoid periods of inactivity such as sitting longer than an hour when not asleep. This helps prevent blood  clots.  ?You may return to work once you are cleared by your surgeon.  ?Do not drive a car for 6 weeks or until released by your surgeon.  ?Do not drive while taking narcotics.  ?Wear elastic stockings for two weeks following surgery during the day but you may remove then at night.  ?Make sure you keep all of your appointments after your operation with all of your doctors and caregivers. You should call the office at the above phone number and make an appointment for approximately two weeks after the date of your surgery. ?Please pick up a stool softener and laxative for home use as long as you are requiring pain medications. ?ICE to the affected hip every three hours for 30 minutes at a time and then as needed for pain and swelling. Continue to use ice on the hip for pain and swelling from surgery. You may notice swelling that will progress down to the foot and ankle.  This is normal after surgery.  Elevate the leg when you are not up walking on it.   ?It is important for you to complete the blood thinner medication as prescribed by your doctor. ?Continue to use the breathing machine which will help keep your temperature down.  It is common for your temperature to cycle up and down following surgery, especially at night when you are not up moving around and exerting yourself.  The breathing machine keeps your lungs expanded and your temperature down. ? ?RANGE OF MOTION AND STRENGTHENING EXERCISES  ?These exercises are designed to help you   keep full movement of your hip joint. Follow your caregiver's or physical therapist's instructions. Perform all exercises about fifteen times, three times per day or as directed. Exercise both hips, even if you have had only one joint replacement. These exercises can be done on a training (exercise) mat, on the floor, on a table or on a bed. Use whatever works the best and is most comfortable for you. Use music or television while you are exercising so that the exercises are a  pleasant break in your day. This will make your life better with the exercises acting as a break in routine you can look forward to.  ?Lying on your back, slowly slide your foot toward your buttocks, raising your knee up off the floor. Then slowly slide your foot back down until your leg is straight again.  ?Lying on your back spread your legs as far apart as you can without causing discomfort.  ?Lying on your side, raise your upper leg and foot straight up from the floor as far as is comfortable. Slowly lower the leg and repeat.  ?Lying on your back, tighten up the muscle in the front of your thigh (quadriceps muscles). You can do this by keeping your leg straight and trying to raise your heel off the floor. This helps strengthen the largest muscle supporting your knee.  ?Lying on your back, tighten up the muscles of your buttocks both with the legs straight and with the knee bent at a comfortable angle while keeping your heel on the floor.  ? ?SKILLED REHAB INSTRUCTIONS: ?If the patient is transferred to a skilled rehab facility following release from the hospital, a list of the current medications will be sent to the facility for the patient to continue.  When discharged from the skilled rehab facility, please have the facility set up the patient's Home Health Physical Therapy prior to being released. Also, the skilled facility will be responsible for providing the patient with their medications at time of release from the facility to include their pain medication and their blood thinner medication. If the patient is still at the rehab facility at time of the two week follow up appointment, the skilled rehab facility will also need to assist the patient in arranging follow up appointment in our office and any transportation needs. ? ?POST-OPERATIVE OPIOID TAPER INSTRUCTIONS: ?It is important to wean off of your opioid medication as soon as possible. If you do not need pain medication after your surgery it is ok  to stop day one. ?Opioids include: ?Codeine, Hydrocodone(Norco, Vicodin), Oxycodone(Percocet, oxycontin) and hydromorphone amongst others.  ?Long term and even short term use of opiods can cause: ?Increased pain response ?Dependence ?Constipation ?Depression ?Respiratory depression ?And more.  ?Withdrawal symptoms can include ?Flu like symptoms ?Nausea, vomiting ?And more ?Techniques to manage these symptoms ?Hydrate well ?Eat regular healthy meals ?Stay active ?Use relaxation techniques(deep breathing, meditating, yoga) ?Do Not substitute Alcohol to help with tapering ?If you have been on opioids for less than two weeks and do not have pain than it is ok to stop all together.  ?Plan to wean off of opioids ?This plan should start within one week post op of your joint replacement. ?Maintain the same interval or time between taking each dose and first decrease the dose.  ?Cut the total daily intake of opioids by one tablet each day ?Next start to increase the time between doses. ?The last dose that should be eliminated is the evening dose.  ? ? ?MAKE   SURE YOU:  ?Understand these instructions.  ?Will watch your condition.  ?Will get help right away if you are not doing well or get worse. ? ?Pick up stool softner and laxative for home use following surgery while on pain medications. ?Do not remove your dressing. ?The dressing is waterproof--it is OK to take showers. ?Continue to use ice for pain and swelling after surgery. ?Do not use any lotions or creams on the incision until instructed by your surgeon. ?Total Hip Protocol. ? ?

## 2020-11-08 ENCOUNTER — Inpatient Hospital Stay (HOSPITAL_COMMUNITY): Payer: Medicare Other | Admitting: Anesthesiology

## 2020-11-08 ENCOUNTER — Encounter (HOSPITAL_COMMUNITY)
Admission: EM | Disposition: A | Discharge status: Skilled Nursing Facility | Payer: Self-pay | Source: Home / Self Care | Attending: Internal Medicine

## 2020-11-08 ENCOUNTER — Inpatient Hospital Stay (HOSPITAL_COMMUNITY): Payer: Medicare Other

## 2020-11-08 DIAGNOSIS — S72001A Fracture of unspecified part of neck of right femur, initial encounter for closed fracture: Secondary | ICD-10-CM | POA: Diagnosis not present

## 2020-11-08 HISTORY — PX: TOTAL HIP ARTHROPLASTY: SHX124

## 2020-11-08 LAB — BASIC METABOLIC PANEL
Anion gap: 6 (ref 5–15)
BUN: 7 mg/dL — ABNORMAL LOW (ref 8–23)
CO2: 22 mmol/L (ref 22–32)
Calcium: 8.9 mg/dL (ref 8.9–10.3)
Chloride: 97 mmol/L — ABNORMAL LOW (ref 98–111)
Creatinine, Ser: 0.47 mg/dL (ref 0.44–1.00)
GFR, Estimated: >60 mL/min (ref >60)
Glucose, Bld: 140 mg/dL — ABNORMAL HIGH (ref 70–99)
Potassium: 4.5 mmol/L (ref 3.5–5.1)
Sodium: 125 mmol/L — ABNORMAL LOW (ref 135–145)

## 2020-11-08 LAB — CBC WITH DIFFERENTIAL/PLATELET
Abs Immature Granulocytes: 0.07 10*3/uL (ref 0.00–0.07)
Basophils Absolute: 0 10*3/uL (ref 0.0–0.1)
Basophils Relative: 0 %
Eosinophils Absolute: 0.2 10*3/uL (ref 0.0–0.5)
Eosinophils Relative: 1 %
HCT: 35.9 % — ABNORMAL LOW (ref 36.0–46.0)
Hemoglobin: 12.1 g/dL (ref 12.0–15.0)
Immature Granulocytes: 1 %
Lymphocytes Relative: 6 %
Lymphs Abs: 0.9 10*3/uL (ref 0.7–4.0)
MCH: 28.9 pg (ref 26.0–34.0)
MCHC: 33.7 g/dL (ref 30.0–36.0)
MCV: 85.7 fL (ref 80.0–100.0)
Monocytes Absolute: 0.4 10*3/uL (ref 0.1–1.0)
Monocytes Relative: 2 %
Neutro Abs: 13.8 10*3/uL — ABNORMAL HIGH (ref 1.7–7.7)
Neutrophils Relative %: 90 %
Platelets: 198 10*3/uL (ref 150–400)
RBC: 4.19 MIL/uL (ref 3.87–5.11)
RDW: 13.2 % (ref 11.5–15.5)
WBC: 15.4 10*3/uL — ABNORMAL HIGH (ref 4.0–10.5)
nRBC: 0 % (ref 0.0–0.2)

## 2020-11-08 LAB — OSMOLALITY: Osmolality: 270 mOsm/kg — ABNORMAL LOW (ref 275–295)

## 2020-11-08 LAB — TYPE AND SCREEN
ABO/RH(D): AB NEG
Antibody Screen: NEGATIVE

## 2020-11-08 LAB — SODIUM, URINE, RANDOM: Sodium, Ur: 108 mmol/L

## 2020-11-08 LAB — URIC ACID: Uric Acid, Serum: 2.7 mg/dL (ref 2.5–7.1)

## 2020-11-08 LAB — ABO/RH: ABO/RH(D): AB NEG

## 2020-11-08 LAB — MRSA NEXT GEN BY PCR, NASAL: MRSA by PCR Next Gen: NOT DETECTED

## 2020-11-08 LAB — TSH: TSH: 3.084 u[IU]/mL (ref 0.350–4.500)

## 2020-11-08 SURGERY — ARTHROPLASTY, HIP, TOTAL, ANTERIOR APPROACH
Anesthesia: Monitor Anesthesia Care | Site: Hip | Laterality: Right

## 2020-11-08 MED ORDER — ONDANSETRON HCL 4 MG/2ML IJ SOLN: 4.0000 mg | Freq: Once | INTRAMUSCULAR | Status: DC | PRN

## 2020-11-08 MED ORDER — BUPIVACAINE-EPINEPHRINE (PF) 0.25% -1:200000 IJ SOLN
INTRAMUSCULAR | Status: AC
  Filled 2020-11-08: qty 30

## 2020-11-08 MED ORDER — SODIUM CHLORIDE (PF) 0.9 % IJ SOLN
INTRAMUSCULAR | Status: DC | PRN
  Administered 2020-11-08: 30 mL

## 2020-11-08 MED ORDER — LACTATED RINGERS IV SOLN
INTRAVENOUS | Status: DC | PRN
Start: 2020-11-08 — End: 2020-11-08

## 2020-11-08 MED ORDER — MENTHOL 3 MG MT LOZG
1.0000 | LOZENGE | OROMUCOSAL | Status: DC | PRN
  Administered 2020-11-12: 3 mg via ORAL
  Filled 2020-11-08: qty 9

## 2020-11-08 MED ORDER — ONDANSETRON HCL 4 MG PO TABS: 4.0000 mg | ORAL_TABLET | Freq: Four times a day (QID) | ORAL | Status: DC | PRN

## 2020-11-08 MED ORDER — SODIUM CHLORIDE (PF) 0.9 % IJ SOLN
INTRAMUSCULAR | Status: AC
  Filled 2020-11-08: qty 30

## 2020-11-08 MED ORDER — ONDANSETRON HCL 4 MG/2ML IJ SOLN: 4.0000 mg | Freq: Four times a day (QID) | INTRAMUSCULAR | Status: DC | PRN

## 2020-11-08 MED ORDER — HYDROCODONE-ACETAMINOPHEN 7.5-325 MG PO TABS
1.0000 | ORAL_TABLET | ORAL | Status: DC | PRN
  Administered 2020-11-12: 2 via ORAL
  Filled 2020-11-08: qty 2
  Filled 2020-11-08: qty 1

## 2020-11-08 MED ORDER — METHOCARBAMOL 500 MG PO TABS
500.0000 mg | ORAL_TABLET | Freq: Four times a day (QID) | ORAL | Status: DC | PRN
  Administered 2020-11-08 – 2020-11-12 (×3): 500 mg via ORAL
  Filled 2020-11-08 (×4): qty 1

## 2020-11-08 MED ORDER — SODIUM CHLORIDE 0.9 % IR SOLN
Status: DC | PRN
  Administered 2020-11-08: 2000 mL

## 2020-11-08 MED ORDER — PHENOL 1.4 % MT LIQD: 1.0000 | OROMUCOSAL | Status: DC | PRN

## 2020-11-08 MED ORDER — ISOPROPYL ALCOHOL 70 % SOLN
Status: DC | PRN
  Administered 2020-11-08: 1 via TOPICAL

## 2020-11-08 MED ORDER — POVIDONE-IODINE 10 % EX SWAB: 2.0000 "application " | Freq: Once | CUTANEOUS | Status: DC

## 2020-11-08 MED ORDER — TRANEXAMIC ACID-NACL 1000-0.7 MG/100ML-% IV SOLN
1000.0000 mg | Freq: Once | INTRAVENOUS | Status: AC
  Administered 2020-11-08: 1000 mg via INTRAVENOUS
  Filled 2020-11-08: qty 100

## 2020-11-08 MED ORDER — PROPOFOL 500 MG/50ML IV EMUL
INTRAVENOUS | Status: DC | PRN
  Administered 2020-11-08: 80 ug/kg/min via INTRAVENOUS

## 2020-11-08 MED ORDER — ASPIRIN EC 325 MG PO TBEC
325.0000 mg | DELAYED_RELEASE_TABLET | Freq: Every day | ORAL | Status: DC
  Administered 2020-11-09 – 2020-11-12 (×4): 325 mg via ORAL
  Filled 2020-11-08 (×4): qty 1

## 2020-11-08 MED ORDER — DEXMEDETOMIDINE (PRECEDEX) IN NS 20 MCG/5ML (4 MCG/ML) IV SYRINGE
PREFILLED_SYRINGE | INTRAVENOUS | Status: DC | PRN
  Administered 2020-11-08 (×2): 8 ug via INTRAVENOUS

## 2020-11-08 MED ORDER — 0.9 % SODIUM CHLORIDE (POUR BTL) OPTIME
TOPICAL | Status: DC | PRN
  Administered 2020-11-08: 1000 mL

## 2020-11-08 MED ORDER — METHOCARBAMOL 1000 MG/10ML IJ SOLN
500.0000 mg | Freq: Four times a day (QID) | INTRAVENOUS | Status: DC | PRN
  Filled 2020-11-08: qty 5

## 2020-11-08 MED ORDER — TRANEXAMIC ACID-NACL 1000-0.7 MG/100ML-% IV SOLN
INTRAVENOUS | Status: AC
  Filled 2020-11-08: qty 100

## 2020-11-08 MED ORDER — FENTANYL CITRATE (PF) 100 MCG/2ML IJ SOLN
INTRAMUSCULAR | Status: DC | PRN
  Administered 2020-11-08: 50 ug via INTRAVENOUS

## 2020-11-08 MED ORDER — HYDROCODONE-ACETAMINOPHEN 5-325 MG PO TABS
1.0000 | ORAL_TABLET | ORAL | Status: DC | PRN
  Administered 2020-11-08 – 2020-11-12 (×12): 2 via ORAL
  Filled 2020-11-08 (×14): qty 2

## 2020-11-08 MED ORDER — BUPIVACAINE-EPINEPHRINE 0.25% -1:200000 IJ SOLN
INTRAMUSCULAR | Status: DC | PRN
  Administered 2020-11-08: 30 mL

## 2020-11-08 MED ORDER — MORPHINE SULFATE (PF) 2 MG/ML IV SOLN: 0.5000 mg | INTRAVENOUS | Status: DC | PRN

## 2020-11-08 MED ORDER — BUPIVACAINE IN DEXTROSE 0.75-8.25 % IT SOLN
INTRATHECAL | Status: DC | PRN
  Administered 2020-11-08: 2 mL via INTRATHECAL

## 2020-11-08 MED ORDER — DEXMEDETOMIDINE (PRECEDEX) IN NS 20 MCG/5ML (4 MCG/ML) IV SYRINGE
PREFILLED_SYRINGE | INTRAVENOUS | Status: AC
  Filled 2020-11-08: qty 5

## 2020-11-08 MED ORDER — CEFAZOLIN SODIUM-DEXTROSE 2-4 GM/100ML-% IV SOLN
INTRAVENOUS | Status: AC
  Filled 2020-11-08: qty 100

## 2020-11-08 MED ORDER — FENTANYL CITRATE PF 50 MCG/ML IJ SOSY
PREFILLED_SYRINGE | INTRAMUSCULAR | Status: AC
  Filled 2020-11-08: qty 2

## 2020-11-08 MED ORDER — CEFAZOLIN SODIUM-DEXTROSE 2-4 GM/100ML-% IV SOLN
2.0000 g | INTRAVENOUS | Status: AC
  Administered 2020-11-08: 2 g via INTRAVENOUS

## 2020-11-08 MED ORDER — PHENYLEPHRINE 40 MCG/ML (10ML) SYRINGE FOR IV PUSH (FOR BLOOD PRESSURE SUPPORT)
PREFILLED_SYRINGE | INTRAVENOUS | Status: DC | PRN
  Administered 2020-11-08 (×2): 120 ug via INTRAVENOUS

## 2020-11-08 MED ORDER — METOCLOPRAMIDE HCL 5 MG/ML IJ SOLN: 5.0000 mg | Freq: Three times a day (TID) | INTRAMUSCULAR | Status: DC | PRN

## 2020-11-08 MED ORDER — SODIUM CHLORIDE 0.9 % IV SOLN: INTRAVENOUS | Status: DC

## 2020-11-08 MED ORDER — POVIDONE-IODINE 10 % EX SWAB
2.0000 "application " | Freq: Once | CUTANEOUS | Status: AC
  Administered 2020-11-08: 2 via TOPICAL

## 2020-11-08 MED ORDER — CHLORHEXIDINE GLUCONATE 4 % EX LIQD: 60.0000 mL | Freq: Once | CUTANEOUS | Status: DC

## 2020-11-08 MED ORDER — PHENYLEPHRINE HCL-NACL 20-0.9 MG/250ML-% IV SOLN
INTRAVENOUS | Status: DC | PRN
  Administered 2020-11-08: 50 ug/min via INTRAVENOUS

## 2020-11-08 MED ORDER — PHENYLEPHRINE HCL (PRESSORS) 10 MG/ML IV SOLN
INTRAVENOUS | Status: AC
  Filled 2020-11-08: qty 1

## 2020-11-08 MED ORDER — METOCLOPRAMIDE HCL 5 MG PO TABS: 5.0000 mg | ORAL_TABLET | Freq: Three times a day (TID) | ORAL | Status: DC | PRN

## 2020-11-08 MED ORDER — FENTANYL CITRATE (PF) 100 MCG/2ML IJ SOLN
INTRAMUSCULAR | Status: AC
  Filled 2020-11-08: qty 2

## 2020-11-08 MED ORDER — FENTANYL CITRATE PF 50 MCG/ML IJ SOSY
PREFILLED_SYRINGE | INTRAMUSCULAR | Status: AC
  Filled 2020-11-08: qty 1

## 2020-11-08 MED ORDER — PROPOFOL 1000 MG/100ML IV EMUL
INTRAVENOUS | Status: AC
  Filled 2020-11-08: qty 100

## 2020-11-08 MED ORDER — FENTANYL CITRATE PF 50 MCG/ML IJ SOSY
25.0000 ug | PREFILLED_SYRINGE | INTRAMUSCULAR | Status: DC | PRN
  Administered 2020-11-08 (×2): 25 ug via INTRAVENOUS
  Administered 2020-11-08: 50 ug via INTRAVENOUS

## 2020-11-08 MED ORDER — KETOROLAC TROMETHAMINE 30 MG/ML IJ SOLN
INTRAMUSCULAR | Status: AC
  Filled 2020-11-08: qty 1

## 2020-11-08 MED ORDER — CEFAZOLIN SODIUM-DEXTROSE 2-4 GM/100ML-% IV SOLN
2.0000 g | Freq: Four times a day (QID) | INTRAVENOUS | Status: AC
  Administered 2020-11-08 – 2020-11-09 (×2): 2 g via INTRAVENOUS
  Filled 2020-11-08 (×2): qty 100

## 2020-11-08 MED ORDER — ACETAMINOPHEN 325 MG PO TABS
325.0000 mg | ORAL_TABLET | Freq: Four times a day (QID) | ORAL | Status: DC | PRN
  Administered 2020-11-10 (×2): 650 mg via ORAL
  Filled 2020-11-08 (×2): qty 2

## 2020-11-08 MED ORDER — DOCUSATE SODIUM 100 MG PO CAPS
100.0000 mg | ORAL_CAPSULE | Freq: Two times a day (BID) | ORAL | Status: DC
  Administered 2020-11-08 – 2020-11-12 (×8): 100 mg via ORAL
  Filled 2020-11-08 (×8): qty 1

## 2020-11-08 MED ORDER — TRANEXAMIC ACID-NACL 1000-0.7 MG/100ML-% IV SOLN
1000.0000 mg | INTRAVENOUS | Status: AC
  Administered 2020-11-08: 1000 mg via INTRAVENOUS

## 2020-11-08 MED ORDER — PROPOFOL 10 MG/ML IV BOLUS
INTRAVENOUS | Status: DC | PRN
  Administered 2020-11-08: 10 mg via INTRAVENOUS
  Administered 2020-11-08: 20 mg via INTRAVENOUS

## 2020-11-08 MED ORDER — KETOROLAC TROMETHAMINE 30 MG/ML IJ SOLN
INTRAMUSCULAR | Status: DC | PRN
  Administered 2020-11-08: 30 mg via INTRA_ARTICULAR

## 2020-11-08 MED ORDER — WATER FOR IRRIGATION, STERILE IR SOLN
Status: DC | PRN
  Administered 2020-11-08: 2000 mL

## 2020-11-08 MED ORDER — SENNA 8.6 MG PO TABS
1.0000 | ORAL_TABLET | Freq: Two times a day (BID) | ORAL | Status: DC
  Administered 2020-11-08 – 2020-11-12 (×7): 8.6 mg via ORAL
  Filled 2020-11-08 (×8): qty 1

## 2020-11-08 SURGICAL SUPPLY — 64 items
ADH SKN CLS APL DERMABOND .7 (GAUZE/BANDAGES/DRESSINGS) ×1
APL PRP STRL LF DISP 70% ISPRP (MISCELLANEOUS) ×1
ARTICULEZE HEAD (Hips) ×2 IMPLANT
BAG COUNTER SPONGE SURGICOUNT (BAG) ×1 IMPLANT
BAG DECANTER FOR FLEXI CONT (MISCELLANEOUS) IMPLANT
BAG SPEC THK2 15X12 ZIP CLS (MISCELLANEOUS)
BAG SPNG CNTER NS LX DISP (BAG) ×1
BAG ZIPLOCK 12X15 (MISCELLANEOUS) IMPLANT
BLADE SURG SZ10 CARB STEEL (BLADE) IMPLANT
CHLORAPREP W/TINT 26 (MISCELLANEOUS) ×2 IMPLANT
COVER PERINEAL POST (MISCELLANEOUS) ×2 IMPLANT
COVER SURGICAL LIGHT HANDLE (MISCELLANEOUS) ×2 IMPLANT
DECANTER SPIKE VIAL GLASS SM (MISCELLANEOUS) ×5 IMPLANT
DERMABOND ADVANCED (GAUZE/BANDAGES/DRESSINGS) ×1
DERMABOND ADVANCED .7 DNX12 (GAUZE/BANDAGES/DRESSINGS) ×2 IMPLANT
DRAPE IMP U-DRAPE 54X76 (DRAPES) ×2 IMPLANT
DRAPE SHEET LG 3/4 BI-LAMINATE (DRAPES) ×6 IMPLANT
DRAPE STERI IOBAN 125X83 (DRAPES) ×1 IMPLANT
DRAPE U-SHAPE 47X51 STRL (DRAPES) ×4 IMPLANT
DRSG AQUACEL AG ADV 3.5X10 (GAUZE/BANDAGES/DRESSINGS) ×2 IMPLANT
ELECT REM PT RETURN 15FT ADLT (MISCELLANEOUS) ×2 IMPLANT
GAUZE SPONGE 4X4 12PLY STRL (GAUZE/BANDAGES/DRESSINGS) ×2 IMPLANT
GLOVE SRG 8 PF TXTR STRL LF DI (GLOVE) ×1 IMPLANT
GLOVE SURG UNDER POLY LF SZ8 (GLOVE) ×2
GLOVE SURG UNDER POLY LF SZ8.5 (GLOVE) ×2 IMPLANT
GOWN SPEC L3 XXLG W/TWL (GOWN DISPOSABLE) ×2 IMPLANT
GOWN STRL REUS W/TWL XL LVL3 (GOWN DISPOSABLE) ×2 IMPLANT
HANDPIECE INTERPULSE COAX TIP (DISPOSABLE) ×2
HEAD ARTICULEZE (Hips) IMPLANT
HOLDER FOLEY CATH W/STRAP (MISCELLANEOUS) ×2 IMPLANT
HOOD PEEL AWAY FLYTE STAYCOOL (MISCELLANEOUS) ×6 IMPLANT
JET LAVAGE IRRISEPT WOUND (IRRIGATION / IRRIGATOR)
KIT TURNOVER KIT A (KITS) ×2 IMPLANT
LAVAGE JET IRRISEPT WOUND (IRRIGATION / IRRIGATOR) IMPLANT
LINER ACETAB NEUTRAL 36ID 520D (Liner) ×1 IMPLANT
MANIFOLD NEPTUNE II (INSTRUMENTS) ×2 IMPLANT
MARKER SKIN DUAL TIP RULER LAB (MISCELLANEOUS) ×2 IMPLANT
NDL SAFETY ECLIPSE 18X1.5 (NEEDLE) ×1 IMPLANT
NDL SPNL 18GX3.5 QUINCKE PK (NEEDLE) ×1 IMPLANT
NEEDLE HYPO 18GX1.5 SHARP (NEEDLE) ×2
NEEDLE SPNL 18GX3.5 QUINCKE PK (NEEDLE) ×2 IMPLANT
PACK ANTERIOR HIP CUSTOM (KITS) ×2 IMPLANT
PENCIL SMOKE EVACUATOR (MISCELLANEOUS) ×1 IMPLANT
PIN SECTOR W/GRIP ACE CUP 52MM (Hips) ×1 IMPLANT
SAW OSC TIP CART 19.5X105X1.3 (SAW) ×2 IMPLANT
SEALER BIPOLAR AQUA 6.0 (INSTRUMENTS) ×2 IMPLANT
SET HNDPC FAN SPRY TIP SCT (DISPOSABLE) ×1 IMPLANT
SLEEVE CABLE 2MM VT (Orthopedic Implant) ×1 IMPLANT
SPONGE T-LAP 18X18 ~~LOC~~+RFID (SPONGE) ×2 IMPLANT
STEM TRI LOC BPS SZ6 W GRIPTON IMPLANT
SUT MNCRL AB 3-0 PS2 18 (SUTURE) ×2 IMPLANT
SUT MNCRL AB 4-0 PS2 18 (SUTURE) ×2 IMPLANT
SUT MON AB 2-0 CT1 36 (SUTURE) ×4 IMPLANT
SUT STRATAFIX PDO 1 14 VIOLET (SUTURE) ×2
SUT STRATFX PDO 1 14 VIOLET (SUTURE) ×1
SUT VIC AB 2-0 CT1 27 (SUTURE) ×2
SUT VIC AB 2-0 CT1 TAPERPNT 27 (SUTURE) ×1 IMPLANT
SUTURE STRATFX PDO 1 14 VIOLET (SUTURE) ×1 IMPLANT
SYR 3ML LL SCALE MARK (SYRINGE) ×2 IMPLANT
TRAY FOL W/BAG SLVR 16FR STRL (SET/KITS/TRAYS/PACK) IMPLANT
TRAY FOLEY W/BAG SLVR 16FR LF (SET/KITS/TRAYS/PACK) ×2
TRI LOC BPS SZ 6 W GRIPTON ×2 IMPLANT
TUBE SUCTION HIGH CAP CLEAR NV (SUCTIONS) ×2 IMPLANT
WATER STERILE IRR 1000ML POUR (IV SOLUTION) ×1 IMPLANT

## 2020-11-08 NOTE — Plan of Care (Signed)
  Problem: Education: Goal: Knowledge of General Education information will improve Description Including pain rating scale, medication(s)/side effects and non-pharmacologic comfort measures Outcome: Progressing   Problem: Nutrition: Goal: Adequate nutrition will be maintained Outcome: Progressing   Problem: Pain Managment: Goal: General experience of comfort will improve Outcome: Progressing   

## 2020-11-08 NOTE — Plan of Care (Signed)

## 2020-11-08 NOTE — Op Note (Signed)
OPERATIVE REPORT  SURGEON: Rod Can, MD   ASSISTANT: Cherlynn June, PA-C.  PREOPERATIVE DIAGNOSIS: Displaced Right femoral neck fracture.   POSTOPERATIVE DIAGNOSIS: Displaced Right femoral neck fracture.   PROCEDURE: Right total hip arthroplasty, anterior approach.   IMPLANTS: DePuy Tri Lock stem, size 6, hi offset. DePuy Pinnacle Cup, size 52 mm. DePuy Altrx liner, size 36 by 52 mm, neutral. DePuy metal head ball, size 36 + 5 mm. 2.0 mm Youlanda Mighty Adult reconstruction cable x1.  ANESTHESIA:  MAC and Spinal  ANTIBIOTICS: 2g ancef.  ESTIMATED BLOOD LOSS:-100 mL    DRAINS: None.  COMPLICATIONS: None   CONDITION: PACU - hemodynamically stable.   BRIEF CLINICAL NOTE: [name] [name] is a 87 y.o. female with a displaced Right femoral neck fracture. The patient was admitted to the hospitalist service and underwent perioperative risk stratification and medical optimization. The risks, benefits, and alternatives to total hip arthroplasty were explained, and the patient elected to proceed.  PROCEDURE IN DETAIL: The patient was taken to the operating room and general anesthesia was induced on the hospital bed.  The patient was then positioned on the Hana table.  All bony prominences were well padded.  The hip was prepped and draped in the normal sterile surgical fashion.  A time-out was called verifying side and site of surgery. Antibiotics were given within 60 minutes of beginning the procedure.   Bikini incision was made, and the direct anterior approach to the hip was performed through the Hueter interval.  Lateral femoral circumflex vessels were treated with the Auqumantys. The anterior capsule was exposed and an inverted T capsulotomy was made.  Fracture hematoma was encountered and evacuated. The patient was found to have a comminuted Right subcapital femoral neck fracture.  I freshened the femoral neck cut with a saw.  I removed the femoral neck fragment.  A corkscrew was placed  into the head and the head was removed.  This was passed to the back table and was measured. The pubofemoral ligament was released subperiosteally to the lesser trochanter.  Acetabular exposure was achieved, and the pulvinar and labrum were excised. Sequential reaming of the acetabulum was then performed up to a size 51 mm reamer under direct visulization. A 52 mm cup was then opened and impacted into place at approximately 40 degrees of abduction and 20 degrees of anteversion. The final polyethylene liner was impacted into place and acetabular osteophytes were removed.    I then gained femoral exposure taking care to protect the abductors and greater trochanter.  This was performed using standard external rotation, extension, and adduction.  A cookie cutter was used to enter the femoral canal, and then the femoral canal finder was placed.  Sequential broaching was performed up to a size 6.  Calcar planer was used on the femoral neck remnant.  I placed a hi offset neck and a trial head ball.  The hip was reduced.  Leg lengths and offset were checked fluoroscopically.  The hip was dislocated and trial components were removed.  There was a small v shaped defect in the calcar without propagation. I chose to place a single prophylactic adult reconstruction cable subperiosteally. The final implants were placed, and the hip was reduced.  Fluoroscopy was used to confirm component position and leg lengths.  At 90 degrees of external rotation and full extension, the hip was stable to an anterior directed force.   The wound was copiously irrigated with Irrisept solution and normal saline using pule lavage.  Marcaine  solution was injected into the periarticular soft tissue.  The wound was closed in layers using #1 Stratafix for the fascia, 2-0 Vicryl for the subcutaneous fat, 2-0 Monocryl for the deep dermal layer, and staples + Dermabond for the skin.  Once the glue was fully dried, an Aquacell Ag dressing was applied.   The patient was transported to the recovery room in stable condition.  Sponge, needle, and instrument counts were correct at the end of the case x2.  The patient tolerated the procedure well and there were no known complications.  Please note that a surgical assistant was a medical necessity for this procedure to perform it in a safe and expeditious manner. Assistant was necessary to provide appropriate retraction of vital neurovascular structures, to prevent femoral fracture, and to allow for anatomic placement of the prosthesis.

## 2020-11-08 NOTE — Plan of Care (Signed)
  Problem: Nutrition: Goal: Adequate nutrition will be maintained Outcome: Progressing   Problem: Coping: Goal: Level of anxiety will decrease Outcome: Progressing   Problem: Elimination: Goal: Will not experience complications related to urinary retention Outcome: Progressing   Problem: Pain Managment: Goal: General experience of comfort will improve Outcome: Progressing   

## 2020-11-08 NOTE — Anesthesia Procedure Notes (Signed)
Spinal  Patient location during procedure: OR Start time: 11/08/2020 1:00 PM End time: 11/08/2020 1:10 PM Reason for block: surgical anesthesia Staffing Performed: anesthesiologist  Anesthesiologist: Pervis Hocking, DO Preanesthetic Checklist Completed: patient identified, IV checked, risks and benefits discussed, surgical consent, monitors and equipment checked, pre-op evaluation and timeout performed Spinal Block Patient position: right lateral decubitus Prep: DuraPrep and site prepped and draped Patient monitoring: cardiac monitor, continuous pulse ox and blood pressure Approach: midline Location: L3-4 Injection technique: single-shot Needle Needle type: Pencan  Needle gauge: 24 G Needle length: 9 cm Assessment Sensory level: T6 Events: CSF return Additional Notes Functioning IV was confirmed and monitors were applied. Sterile prep and drape, including hand hygiene and sterile gloves were used. The patient was positioned and the spine was prepped. The skin was anesthetized with lidocaine.  Free flow of clear CSF was obtained prior to injecting local anesthetic into the CSF.  The spinal needle aspirated freely following injection.  The needle was carefully withdrawn.  The patient tolerated the procedure well.

## 2020-11-08 NOTE — Transfer of Care (Signed)
Immediate Anesthesia Transfer of Care Note  Patient: [name] [name]  Procedure(s) Performed: TOTAL HIP ARTHROPLASTY ANTERIOR APPROACH (Right: Hip)  Patient Location: PACU  Anesthesia Type:General  Level of Consciousness: awake, drowsy and patient cooperative  Airway & Oxygen Therapy: Patient Spontanous Breathing and Patient connected to face mask oxygen  Post-op Assessment: Report given to RN and Post -op Vital signs reviewed and stable  Post vital signs: Reviewed and stable  Last Vitals:  Vitals Value Taken Time  BP 110/48 11/08/20 1506  Temp    Pulse 72 11/08/20 1511  Resp 14 11/08/20 1511  SpO2 95 % 11/08/20 1511  Vitals shown include unvalidated device data.  Last Pain:  Vitals:   11/08/20 1100  TempSrc:   PainSc: 0-No pain      Patients Stated Pain Goal: 2 (73/56/70 1410)  Complications: No notable events documented.

## 2020-11-08 NOTE — Interval H&P Note (Signed)
History and Physical Interval Note:  11/08/2020 1:10 PM  [name] [name]  has presented today for surgery, with the diagnosis of RIGHT FEMORAL NECK FRACTURE.  The various methods of treatment have been discussed with the patient and family. After consideration of risks, benefits and other options for treatment, the patient has consented to  Procedure(s): TOTAL HIP ARTHROPLASTY ANTERIOR APPROACH (Right) as a surgical intervention.  The patient's history has been reviewed, patient examined, no change in status, stable for surgery.  I have reviewed the patient's chart and labs.  Questions were answered to the patient's satisfaction.     Hilton Cork Roselene Gray

## 2020-11-08 NOTE — TOC Initial Note (Signed)
Transition of Care [facility]) - Initial/Assessment Note   Patient Details  Name: [name] [name] MRN: [id number] Date of Birth: March 29, 1929  Transition of Care [facility]) CM/SW Contact:    Sherie Don, LCSW Phone Number: 11/08/2020, 2:41 PM  Clinical Narrative: CSW spoke with patient's daughter, Tretha Sciara, as patient is having surgery today. CSW discussed SNF likely being the recommendation from PT. Per daughter, the patient is a resident in the independent living homes at [facility] and does not want to go to rehab, but this has been discussed with the patient as being a safer option as she rehabs and continues to recover after surgery.  CSW spoke with Claiborne Billings at [facility]. Claiborne Billings reported that patient can be admitted to private room at the facility and is aware of patient's hesitancy with SNF. Claiborne Billings also notified CSW that the facility is still participating in the waiver and patient will not require 3 midnights. TOC awaiting PT evaluation/recommendation after surgery.  Expected Discharge Plan: Skilled Nursing Facility Barriers to Discharge: Continued Medical Work up  Patient Goals and CMS Choice Patient states their goals for this hospitalization and ongoing recovery are:: Return home CMS Medicare.gov Compare Post Acute Care list provided to:: Patient Represenative (must comment) Choice offered to / list presented to : Patient, Adult Children  Expected Discharge Plan and Services Expected Discharge Plan: [place] In-house Referral: Clinical Social Work Post Acute Care Choice: [place] Living arrangements for the past 2 months: [place]           DME Arranged: N/A DME Agency: NA  Prior Living Arrangements/Services Living arrangements for the past 2 months: [place] Lives with:: Self Patient language and need for interpreter reviewed:: Yes Do you feel safe going back to the place where you live?: Yes      Need for  Family Participation in Patient Care: Yes (Comment) Care giver support system in place?: Yes (comment) Criminal Activity/Legal Involvement Pertinent to Current Situation/Hospitalization: No - Comment as needed  Activities of Daily Living Home Assistive Devices/Equipment: Cane (specify quad or straight), Dentures (specify type) ADL Screening (condition at time of admission) Patient's cognitive ability adequate to safely complete daily activities?: Yes Is the patient deaf or have difficulty hearing?: No Does the patient have difficulty seeing, even when wearing glasses/contacts?: No Does the patient have difficulty concentrating, remembering, or making decisions?: No Patient able to express need for assistance with ADLs?: Yes Does the patient have difficulty dressing or bathing?: No Independently performs ADLs?: Yes (appropriate for developmental age) Does the patient have difficulty walking or climbing stairs?: No Weakness of Legs: None Weakness of Arms/Hands: None  Permission Sought/Granted Permission sought to share information with : Facility Art therapist granted to share information with : Yes, Verbal Permission Granted Permission granted to share info w AGENCY: [facility]  Emotional Assessment Orientation: : Oriented to Self, Oriented to Place, Oriented to  Time, Oriented to Situation Alcohol / Substance Use: Not Applicable Psych Involvement: No (comment)  Admission diagnosis:  Effusion of right knee [M25.461] Closed right hip fracture (HCC) [S72.001A] Closed fracture of right hip, initial encounter [facility]) [S72.001A] Patient Active Problem List   Diagnosis Date Noted   Closed right hip fracture ([place]) 11/07/2020   Chest pain 03/28/2015   PCP:  Lajean Manes, MD Pharmacy:   [facility] [place], [address] [place] [address] Phone: [phone] Fax:  [phone]  Readmission Risk Interventions No flowsheet data found.

## 2020-11-08 NOTE — Progress Notes (Signed)
PROGRESS NOTE    [name] [name]  YHC:[id number] DOB: 09-29-1929 DOA: 11/06/2020 PCP: Lajean Manes, MD   Chief Complain: Fall  Brief Narrative:  Patient is a 87 year old female with history of hypertension, hyperlipidemia, anxiety who presented here after she fell at home when she was getting up from the chair and lost her balance.  She fell on her right side and was unable to ambulate and also developed pain on the right hip.  On presentation she was hypertensive, lab work showed hypokalemia, CBC showed elevated white cells.  CT was was negative for acute intracranial abnormalities.  Knee x-ray did not show any fracture or dislocation.  Pelvic CT showed impacted fracture of the right femoral neck.  Orthopedics consulted, she is undergoing right hemiarthroplasty today.  Assessment & Plan:   Active Problems:   Closed right hip fracture (HCC)   Closed impacted right hip fracture/right femoral neck fracture: Secondary to mechanical fall.  Lives at home.  Continue pain management, supportive care.  Orthopedics doing right hemiarthroplasty today.  She may need skilled nursing facility on discharge.  We will request for PT/OT evaluation.  Hypertension: On Norvasc, Accupril and hydrochlorothiazide at home.  Monitor blood pressure.  Leukocytosis: Unclear etiology.  Most likely reactive.  Continue to monitor  Hyponatremia: Sodium level dropped to 125.  Continue gentle IV fluids.  Will check SIADH panel.  GERD: On PPI  Recent history of UTI: She was recently started on treatment by her PCP with levofloxacin to 250 mg for 5 days.  Currently being continued  Hyperlipidemia: On Crestor         DVT prophylaxis:SCD Code Status: Full Family Communication: Daughter present at the bedside Status is: Inpatient  Remains inpatient appropriate because:IV treatments appropriate due to intensity of illness or inability to take PO and Inpatient level of care appropriate due to severity of  illness  Dispo: The patient is from: Home              Anticipated d/c is to: SNF              Patient currently is not medically stable to d/c.   Difficult to place patient No  Consultants: Orthopedics  Procedures: Plan for hemiarthroplasty  Antimicrobials:  Anti-infectives (From admission, onward)    Start     Dose/Rate Route Frequency Ordered Stop   11/08/20 1130  ceFAZolin (ANCEF) IVPB 2g/100 mL premix        2 g 200 mL/hr over 30 Minutes Intravenous On call to O.R. 11/08/20 1126 11/09/20 0559   11/08/20 1129  ceFAZolin (ANCEF) 2-4 GM/100ML-% IVPB       Note to Pharmacy: Randa Evens  : cabinet override      11/08/20 1129 11/08/20 2344   11/07/20 1600  [MAR Hold]  levofloxacin (LEVAQUIN) tablet 250 mg        (MAR Hold since Thu 11/08/2020 at 1117.Hold Reason: Transfer to a Procedural area)   250 mg Oral Daily 11/07/20 1245 11/12/20 0959       Subjective:  Patient seen and examined at the bedside this morning.  Hemodynamically stable during my evaluation.  Looks comfortable overall, denies any significant pain on the fracture site.  Eagerly waiting for surgery.  Daughter was at the bedside  Objective: Vitals:   11/07/20 1310 11/07/20 2029 11/08/20 0449 11/08/20 1012  BP: 124/74 133/64 (!) 154/68 (!) 149/69  Pulse: 87 81 74 85  Resp: 18 16 16 16   Temp: 98.5 F (36.9 C) 98.1 F (  36.7 C) 97.7 F (36.5 C) 98.5 F (36.9 C)  TempSrc:  Oral Oral Oral  SpO2: 98% 95% 94% 94%  Weight:      Height:        Intake/Output Summary (Last 24 hours) at 11/08/2020 1138 Last data filed at 11/08/2020 1127 Gross per 24 hour  Intake 2573.81 ml  Output 2100 ml  Net 473.81 ml   Filed Weights   11/07/20 0612  Weight: 52.6 kg    Examination:  General exam: Overall comfortable, not in distress, pleasant elderly female HEENT: PERRL Respiratory system:  no wheezes or crackles  Cardiovascular system: S1 & S2 heard, RRR.  Gastrointestinal system: Abdomen is nondistended, soft  and nontender. Central nervous system: Alert and oriented Extremities: No edema, no clubbing ,no cyanosis, tenderness on the right hip Skin: No rashes, no ulcers,no icterus       Data Reviewed: I have personally reviewed following labs and imaging studies  CBC: Recent Labs  Lab 11/07/20 0259 11/08/20 0336  WBC 16.2* 15.4*  NEUTROABS  --  13.8*  HGB 14.4 12.1  HCT 42.8 35.9*  MCV 86.1 85.7  PLT 271 462   Basic Metabolic Panel: Recent Labs  Lab 11/07/20 0259 11/08/20 0336  NA 130* 125*  K 3.4* 4.5  CL 94* 97*  CO2 24 22  GLUCOSE 132* 140*  BUN 11 7*  CREATININE 0.48 0.47  CALCIUM 9.7 8.9   GFR: Estimated Creatinine Clearance: 39.6 mL/min (by C-G formula based on SCr of 0.47 mg/dL). Liver Function Tests: No results for input(s): AST, ALT, ALKPHOS, BILITOT, PROT, ALBUMIN in the last 168 hours. No results for input(s): LIPASE, AMYLASE in the last 168 hours. No results for input(s): AMMONIA in the last 168 hours. Coagulation Profile: Recent Labs  Lab 11/07/20 0259  INR 1.0   Cardiac Enzymes: No results for input(s): CKTOTAL, CKMB, CKMBINDEX, TROPONINI in the last 168 hours. BNP (last 3 results) No results for input(s): PROBNP in the last 8760 hours. HbA1C: No results for input(s): HGBA1C in the last 72 hours. CBG: No results for input(s): GLUCAP in the last 168 hours. Lipid Profile: No results for input(s): CHOL, HDL, LDLCALC, TRIG, CHOLHDL, LDLDIRECT in the last 72 hours. Thyroid Function Tests: No results for input(s): TSH, T4TOTAL, FREET4, T3FREE, THYROIDAB in the last 72 hours. Anemia Panel: No results for input(s): VITAMINB12, FOLATE, FERRITIN, TIBC, IRON, RETICCTPCT in the last 72 hours. Sepsis Labs: No results for input(s): PROCALCITON, LATICACIDVEN in the last 168 hours.  Recent Results (from the past 240 hour(s))  SARS CORONAVIRUS 2 (TAT 6-24 HRS) Nasopharyngeal Nasopharyngeal Swab     Status: None   Collection Time: 11/07/20  2:59 AM   Specimen:  Nasopharyngeal Swab  Result Value Ref Range Status   SARS Coronavirus 2 NEGATIVE NEGATIVE Final    Comment: (NOTE) SARS-CoV-2 target nucleic acids are NOT DETECTED.  The SARS-CoV-2 RNA is generally detectable in upper and lower respiratory specimens during the acute phase of infection. Negative results do not preclude SARS-CoV-2 infection, do not rule out co-infections with other pathogens, and should not be used as the sole basis for treatment or other patient management decisions. Negative results must be combined with clinical observations, patient history, and epidemiological information. The expected result is Negative.  Fact Sheet for Patients: SugarRoll.be  Fact Sheet for Healthcare Providers: https://www.woods-mathews.com/  This test is not yet approved or cleared by the [place] FDA and  has been authorized for detection and/or diagnosis of SARS-CoV-2 by FDA under  an Emergency Use Authorization (EUA). This EUA will remain  in effect (meaning this test can be used) for the duration of the COVID-19 declaration under Se ction 564(b)(1) of the Act, 21 U.S.C. section 360bbb-3(b)(1), unless the authorization is terminated or revoked sooner.  Performed at [facility], [place] [address], [place], [place] [address]   MRSA Next Gen by PCR, Nasal     Status: None   Collection Time: 11/08/20  5:40 AM   Specimen: Nasal Mucosa; Nasal Swab  Result Value Ref Range Status   MRSA by PCR Next Gen NOT DETECTED NOT DETECTED Final    Comment: (NOTE) The GeneXpert MRSA Assay (FDA approved for NASAL specimens only), is one component of a comprehensive MRSA colonization surveillance program. It is not intended to diagnose MRSA infection nor to guide or monitor treatment for MRSA infections. Test performance is not FDA approved in patients less than 86 years old. Performed at [facility], [place] [address], [place],  [place] [address]          Radiology Studies: CT HEAD WO CONTRAST (5MM)  Result Date: 11/07/2020 CLINICAL DATA:  Facial trauma. EXAM: CT HEAD WITHOUT CONTRAST TECHNIQUE: Contiguous axial images were obtained from the base of the skull through the vertex without intravenous contrast. COMPARISON:  CT head 10/15/2010. FINDINGS: Brain: No evidence of acute infarction, hemorrhage, hydrocephalus, extra-axial collection or mass lesion/mass effect. There is mild patchy periventricular and deep white matter hypodensity, likely chronic small vessel ischemic change. Vascular: Atherosclerotic calcifications are present within the cavernous internal carotid arteries. Skull: Normal. Negative for fracture or focal lesion. Sinuses/Orbits: There are postsurgical changes in both globes. The visualized paranasal sinuses and mastoid air cells are clear. Other: None. IMPRESSION: 1. No acute intracranial abnormality. 2. Mild chronic small vessel ischemic change. Electronically Signed   By: Ronney Asters M.D.   On: 11/07/2020 00:01   CT PELVIS WO CONTRAST  Result Date: 11/07/2020 CLINICAL DATA:  Pelvic trauma. EXAM: CT PELVIS WITHOUT CONTRAST TECHNIQUE: Multidetector CT imaging of the pelvis was performed following the standard protocol without intravenous contrast. COMPARISON:  CT of the abdomen pelvis dated 09/21/2011. FINDINGS: Urinary Tract:  The urinary bladder is unremarkable. Bowel: Sigmoid diverticulosis without active inflammatory changes. No bowel dilatation or inflammation in the pelvis. Vascular/Lymphatic: Advanced aortoiliac atherosclerotic disease. Adenopathy. Reproductive: The uterus is grossly unremarkable. A pessary is noted. Other:  None Musculoskeletal: There is mildly impacted fracture of the right femoral neck. No other acute fracture. The bones are osteopenic. There is no dislocation. IMPRESSION: 1. Mildly impacted fracture of the right femoral neck. 2. Aortic Atherosclerosis (ICD10-I70.0). Electronically  Signed   By: Anner Crete M.D.   On: 11/07/2020 02:30   DG Knee Complete 4 Views Right  Result Date: 11/06/2020 CLINICAL DATA:  Status post fall. EXAM: RIGHT KNEE - COMPLETE 4+ VIEW COMPARISON:  None. FINDINGS: No evidence of acute fracture or dislocation. No evidence of arthropathy or other focal bone abnormality. A very small joint effusion is noted. IMPRESSION: 1. No acute fracture or dislocation. 2. Very small joint effusion. Electronically Signed   By: Virgina Norfolk M.D.   On: 11/06/2020 23:58   DG Hip Unilat W or Wo Pelvis 2-3 Views Right  Result Date: 11/06/2020 CLINICAL DATA:  Status post fall. EXAM: DG HIP (WITH OR WITHOUT PELVIS) 2-3V RIGHT COMPARISON:  None. FINDINGS: There is no evidence of hip fracture or dislocation. Degenerative changes are seen in the form joint space narrowing and acetabular sclerosis. IMPRESSION:  No acute osseous injury. Electronically Signed   By: Virgina Norfolk M.D.   On: 11/06/2020 23:57        Scheduled Meds:  [MAR Hold] amLODipine  10 mg Oral QHS   [MAR Hold] calcium carbonate  1 tablet Oral Q breakfast   chlorhexidine  60 mL Topical Once   [MAR Hold] estradiol  1 Applicatorful Vaginal Once per day on Mon Wed Fri   [facility] Hold] levofloxacin  250 mg Oral Daily   [MAR Hold] lidocaine  3 patch Transdermal Q24H   [MAR Hold] lisinopril  40 mg Oral Daily   [MAR Hold] multivitamin with minerals  1 tablet Oral Daily   [MAR Hold] omega-3 acid ethyl esters  1 g Oral Daily   [MAR Hold] pantoprazole  40 mg Oral Q1200   [MAR Hold] polyethylene glycol  17 g Oral Daily   [MAR Hold] rosuvastatin  10 mg Oral QODAY   [MAR Hold] saccharomyces boulardii  250 mg Oral QODAY   Continuous Infusions:  sodium chloride Stopped (11/08/20 1111)   ceFAZolin      ceFAZolin (ANCEF) IV     [MAR Hold] sodium chloride     tranexamic acid     tranexamic acid       LOS: 1 day    Time spent: More than 50% of that time was spent in counseling and/or coordination of  care.      Shelly Coss, MD [facility] P9/22/2022, 11:38 AM

## 2020-11-09 DIAGNOSIS — S72001A Fracture of unspecified part of neck of right femur, initial encounter for closed fracture: Secondary | ICD-10-CM | POA: Diagnosis not present

## 2020-11-09 LAB — CBC WITH DIFFERENTIAL/PLATELET
Abs Immature Granulocytes: 0.03 10*3/uL (ref 0.00–0.07)
Basophils Absolute: 0 10*3/uL (ref 0.0–0.1)
Basophils Relative: 0 %
Eosinophils Absolute: 0.4 10*3/uL (ref 0.0–0.5)
Eosinophils Relative: 5 %
HCT: 30.2 % — ABNORMAL LOW (ref 36.0–46.0)
Hemoglobin: 10 g/dL — ABNORMAL LOW (ref 12.0–15.0)
Immature Granulocytes: 0 %
Lymphocytes Relative: 10 %
Lymphs Abs: 0.8 10*3/uL (ref 0.7–4.0)
MCH: 28.7 pg (ref 26.0–34.0)
MCHC: 33.1 g/dL (ref 30.0–36.0)
MCV: 86.5 fL (ref 80.0–100.0)
Monocytes Absolute: 0.4 10*3/uL (ref 0.1–1.0)
Monocytes Relative: 5 %
Neutro Abs: 6.6 10*3/uL (ref 1.7–7.7)
Neutrophils Relative %: 80 %
Platelets: 153 10*3/uL (ref 150–400)
RBC: 3.49 MIL/uL — ABNORMAL LOW (ref 3.87–5.11)
RDW: 13 % (ref 11.5–15.5)
WBC: 8.3 10*3/uL (ref 4.0–10.5)
nRBC: 0 % (ref 0.0–0.2)

## 2020-11-09 LAB — BASIC METABOLIC PANEL
Anion gap: 7 (ref 5–15)
BUN: 9 mg/dL (ref 8–23)
CO2: 23 mmol/L (ref 22–32)
Calcium: 8.6 mg/dL — ABNORMAL LOW (ref 8.9–10.3)
Chloride: 99 mmol/L (ref 98–111)
Creatinine, Ser: 0.49 mg/dL (ref 0.44–1.00)
GFR, Estimated: >60 mL/min (ref >60)
Glucose, Bld: 120 mg/dL — ABNORMAL HIGH (ref 70–99)
Potassium: 3.9 mmol/L (ref 3.5–5.1)
Sodium: 129 mmol/L — ABNORMAL LOW (ref 135–145)

## 2020-11-09 LAB — CORTISOL: Cortisol, Plasma: 15.5 ug/dL

## 2020-11-09 MED ORDER — GUAIFENESIN ER 600 MG PO TB12
600.0000 mg | ORAL_TABLET | Freq: Two times a day (BID) | ORAL | Status: DC
  Administered 2020-11-09 – 2020-11-12 (×6): 600 mg via ORAL
  Filled 2020-11-09 (×6): qty 1

## 2020-11-09 MED ORDER — PANTOPRAZOLE SODIUM 40 MG PO TBEC
40.0000 mg | DELAYED_RELEASE_TABLET | Freq: Every day | ORAL | Status: DC
  Administered 2020-11-10 – 2020-11-12 (×3): 40 mg via ORAL
  Filled 2020-11-09 (×4): qty 1

## 2020-11-09 MED ORDER — ASPIRIN EC 81 MG PO TBEC: 81.0000 mg | DELAYED_RELEASE_TABLET | Freq: Two times a day (BID) | ORAL | 0 refills | Status: AC

## 2020-11-09 MED ORDER — HYDROCODONE-ACETAMINOPHEN 5-325 MG PO TABS: 1.0000 | ORAL_TABLET | ORAL | 0 refills | Status: DC | PRN

## 2020-11-09 MED ORDER — BISACODYL 10 MG RE SUPP
10.0000 mg | Freq: Once | RECTAL | Status: AC
  Administered 2020-11-09: 10 mg via RECTAL
  Filled 2020-11-09: qty 1

## 2020-11-09 NOTE — Progress Notes (Signed)
PROGRESS NOTE    [name] [name]  WGN:[id number] DOB: December 03, 1929 DOA: 11/06/2020 PCP: Lajean Manes, MD   Chief Complain: Fall  Brief Narrative:  Patient is a 87 year old female with history of hypertension, hyperlipidemia, anxiety who presented here after she fell at home when she was getting up from the chair and lost her balance.  She fell on her right side and was unable to ambulate and also developed pain on the right hip.  On presentation she was hypertensive, lab work showed hypokalemia, CBC showed elevated white cells.  CT was was negative for acute intracranial abnormalities.  Knee x-ray did not show any fracture or dislocation.  Pelvic CT showed impacted fracture of the right femoral neck.  Orthopedics consulted, she underwent  right total arthroplasty on 9.22.22.  PT/OT recommended skilled nursing facility on discharge.  She is medically stable for discharge to SNF as soon as bed is available.  Assessment & Plan:   Active Problems:   Closed right hip fracture (HCC)   Closed impacted right hip fracture/right femoral neck fracture: Secondary to mechanical fall.  Lives at home.  Continue pain management, supportive care.  Orthopedics did  right totalarthroplasty .   PT/OT recommended skilled nursing facility on discharge.  Hypertension: On Norvasc, Accupril and hydrochlorothiazide at home.  Monitor blood pressure.  Blood pressure stable  Leukocytosis:  Most likely reactive.  Resolved  Hyponatremia: Na of 129 today. Continue to monitor  GERD: On PPI  Recent history of UTI: She was recently started on treatment by her PCP with levofloxacin to 250 mg for 5 days.  Currently being continued  Hyperlipidemia: On Crestor  Constipation: Continue bowel regimen         DVT prophylaxis:SCD Code Status: Full Family Communication: Daughter present at the bedside Status is: Inpatient  Remains inpatient appropriate because:IV treatments appropriate due to intensity of illness or  inability to take PO and Inpatient level of care appropriate due to severity of illness  Dispo: The patient is from: Home              Anticipated d/c is to: SNF              Patient currently is not medically stable to d/c.   Difficult to place patient No  Consultants: Orthopedics  Procedures: Plan for hemiarthroplasty  Antimicrobials:  Anti-infectives (From admission, onward)    Start     Dose/Rate Route Frequency Ordered Stop   11/08/20 1900  ceFAZolin (ANCEF) IVPB 2g/100 mL premix        2 g 200 mL/hr over 30 Minutes Intravenous Every 6 hours 11/08/20 1630 11/09/20 0128   11/08/20 1130  ceFAZolin (ANCEF) IVPB 2g/100 mL premix        2 g 200 mL/hr over 30 Minutes Intravenous On call to O.R. 11/08/20 1126 11/08/20 1333   11/08/20 1129  ceFAZolin (ANCEF) 2-4 GM/100ML-% IVPB       Note to Pharmacy: Randa Evens  : cabinet override      11/08/20 1129 11/08/20 1305   11/07/20 1600  levofloxacin (LEVAQUIN) tablet 250 mg        250 mg Oral Daily 11/07/20 1245 11/12/20 0959       Subjective:  Patient seen and examined at bedside this morning.  Hemodynamically stable.  Sitting in the chair.  Denies any complaints today.  Pain well controlled.  Complains of constipation  Objective: Vitals:   11/08/20 1600 11/08/20 1627 11/08/20 2306 11/09/20 0726  BP: (!) 151/68 (!) 157/69 Marland Kitchen)  127/54 122/65  Pulse: 78 79 78 75  Resp: 15 16 17 16   Temp:  97.8 F (36.6 C) 97.9 F (36.6 C) 98.5 F (36.9 C)  TempSrc:  Oral Oral Oral  SpO2: 96% 95% 93% 92%  Weight:      Height:        Intake/Output Summary (Last 24 hours) at 11/09/2020 0820 Last data filed at 11/09/2020 0645 Gross per 24 hour  Intake 2360.02 ml  Output 2450 ml  Net -89.98 ml   Filed Weights   11/07/20 0612  Weight: 52.6 kg    Examination:  General exam: Overall comfortable, not in distress HEENT: PERRL Respiratory system:  no wheezes or crackles  Cardiovascular system: S1 & S2 heard, RRR.  Gastrointestinal  system: Abdomen is nondistended, soft and nontender. Central nervous system: Alert and oriented Extremities: No edema, no clubbing ,no cyanosis, clean surgical wound on the right hip Skin: No rashes, no ulcers,no icterus     Data Reviewed: I have personally reviewed following labs and imaging studies  CBC: Recent Labs  Lab 11/07/20 0259 11/08/20 0336 11/09/20 0326  WBC 16.2* 15.4* 8.3  NEUTROABS  --  13.8* 6.6  HGB 14.4 12.1 10.0*  HCT 42.8 35.9* 30.2*  MCV 86.1 85.7 86.5  PLT 271 198 387   Basic Metabolic Panel: Recent Labs  Lab 11/07/20 0259 11/08/20 0336 11/09/20 0326  NA 130* 125* 129*  K 3.4* 4.5 3.9  CL 94* 97* 99  CO2 24 22 23   GLUCOSE 132* 140* 120*  BUN 11 7* 9  CREATININE 0.48 0.47 0.49  CALCIUM 9.7 8.9 8.6*   GFR: Estimated Creatinine Clearance: 39.6 mL/min (by C-G formula based on SCr of 0.49 mg/dL). Liver Function Tests: No results for input(s): AST, ALT, ALKPHOS, BILITOT, PROT, ALBUMIN in the last 168 hours. No results for input(s): LIPASE, AMYLASE in the last 168 hours. No results for input(s): AMMONIA in the last 168 hours. Coagulation Profile: Recent Labs  Lab 11/07/20 0259  INR 1.0   Cardiac Enzymes: No results for input(s): CKTOTAL, CKMB, CKMBINDEX, TROPONINI in the last 168 hours. BNP (last 3 results) No results for input(s): PROBNP in the last 8760 hours. HbA1C: No results for input(s): HGBA1C in the last 72 hours. CBG: No results for input(s): GLUCAP in the last 168 hours. Lipid Profile: No results for input(s): CHOL, HDL, LDLCALC, TRIG, CHOLHDL, LDLDIRECT in the last 72 hours. Thyroid Function Tests: Recent Labs    11/08/20 1653  TSH 3.084   Anemia Panel: No results for input(s): VITAMINB12, FOLATE, FERRITIN, TIBC, IRON, RETICCTPCT in the last 72 hours. Sepsis Labs: No results for input(s): PROCALCITON, LATICACIDVEN in the last 168 hours.  Recent Results (from the past 240 hour(s))  SARS CORONAVIRUS 2 (TAT 6-24 HRS)  Nasopharyngeal Nasopharyngeal Swab     Status: None   Collection Time: 11/07/20  2:59 AM   Specimen: Nasopharyngeal Swab  Result Value Ref Range Status   SARS Coronavirus 2 NEGATIVE NEGATIVE Final    Comment: (NOTE) SARS-CoV-2 target nucleic acids are NOT DETECTED.  The SARS-CoV-2 RNA is generally detectable in upper and lower respiratory specimens during the acute phase of infection. Negative results do not preclude SARS-CoV-2 infection, do not rule out co-infections with other pathogens, and should not be used as the sole basis for treatment or other patient management decisions. Negative results must be combined with clinical observations, patient history, and epidemiological information. The expected result is Negative.  Fact Sheet for Patients: SugarRoll.be  Fact Sheet  for Healthcare Providers: https://www.woods-mathews.com/  This test is not yet approved or cleared by the [place] and  has been authorized for detection and/or diagnosis of SARS-CoV-2 by FDA under an Emergency Use Authorization (EUA). This EUA will remain  in effect (meaning this test can be used) for the duration of the COVID-19 declaration under Se ction 564(b)(1) of the Act, 21 U.S.C. section 360bbb-3(b)(1), unless the authorization is terminated or revoked sooner.  Performed at [facility], [place] [address] [place], [place] [address]   MRSA Next Gen by PCR, Nasal     Status: None   Collection Time: 11/08/20  5:40 AM   Specimen: Nasal Mucosa; Nasal Swab  Result Value Ref Range Status   MRSA by PCR Next Gen NOT DETECTED NOT DETECTED Final    Comment: (NOTE) The GeneXpert MRSA Assay (FDA approved for NASAL specimens only), is one component of a comprehensive MRSA colonization surveillance program. It is not intended to diagnose MRSA infection nor to guide or monitor treatment for MRSA infections. Test performance is not FDA approved in patients  less than 89 years old. Performed at [facility], [place] [address], [place], [place] [address]          Radiology Studies: Pelvis Portable  Result Date: 11/08/2020 CLINICAL DATA:  Status post right hip replacement EXAM: PORTABLE PELVIS 1 VIEWS COMPARISON:  Intraoperative films from earlier in the same day. FINDINGS: Right hip prosthesis is noted in satisfactory position. Proximal cerclage wire is noted as well. No acute soft tissue abnormality is seen. IMPRESSION: Status post right hip replacement. Electronically Signed   By: Inez Catalina M.D.   On: 11/08/2020 18:57   DG C-Arm 1-60 Min-No Report  Result Date: 11/08/2020 Fluoroscopy was utilized by the requesting physician.  No radiographic interpretation.   DG HIP OPERATIVE UNILAT W OR W/O PELVIS RIGHT  Result Date: 11/08/2020 CLINICAL DATA:  Total hip arthroplasty. EXAM: OPERATIVE RIGHT HIP (WITH PELVIS IF PERFORMED) TECHNIQUE: Fluoroscopic spot image(s) were submitted for interpretation post-operatively. COMPARISON:  Preoperative radiograph 11/06/2020 FINDINGS: Two fluoroscopic spot views of the pelvis and right hip obtained in the operating room. Right hip arthroplasty with cerclage wire fixation. Fluoroscopy time 14 seconds. IMPRESSION: Procedural fluoroscopy for right hip arthroplasty. Electronically Signed   By: Keith Rake M.D.   On: 11/08/2020 15:57        Scheduled Meds:  amLODipine  10 mg Oral QHS   aspirin EC  325 mg Oral Q breakfast   calcium carbonate  1 tablet Oral Q breakfast   docusate sodium  100 mg Oral BID   estradiol  1 Applicatorful Vaginal Once per day on Mon Wed Fri   levofloxacin  250 mg Oral Daily   lidocaine  3 patch Transdermal Q24H   lisinopril  40 mg Oral Daily   omega-3 acid ethyl esters  1 g Oral Daily   pantoprazole  40 mg Oral Q1200   polyethylene glycol  17 g Oral Daily   rosuvastatin  10 mg Oral QODAY   saccharomyces boulardii  250 mg Oral QODAY   senna  1 tablet Oral  BID   Continuous Infusions:  methocarbamol (ROBAXIN) IV       LOS: 2 days    Time spent: 25 mins.More than 50% of that time was spent in counseling and/or coordination of care.      Shelly Coss, MD [facility] P9/23/2022, 8:20 AM

## 2020-11-09 NOTE — TOC Progression Note (Addendum)
Transition of Care [facility]) - Progression Note   Patient Details  Name: [name] [name] MRN: [id number] Date of Birth: Jul 13, 1929  Transition of Care [facility] CM/SW [place], LCSW Phone Number: 11/09/2020, 11:25 AM  Clinical Narrative: PT evaluation recommended SNF. FL2 done; PASRR received. FL2 faxed to [facility] in the hub.  Patient is agreeable to rehab at [facility], but is not feeling stable enough for discharge. CSW spoke with Claiborne Billings at [facility] and the facility would need to accept the patient on Monday as they prefer to not do weekend admissions due to limited staff. CSW updated hospitalist and daughter. TOC to follow.  Expected Discharge Plan: [place] Barriers to Discharge: Continued Medical Work up  Expected Discharge Plan and Services Expected Discharge Plan: [place] In-house Referral: Clinical Social Work Post Acute Care Choice: [place] Living arrangements for the past 2 months: [place]             DME Arranged: N/A DME Agency: NA  Readmission Risk Interventions No flowsheet data found.

## 2020-11-09 NOTE — Progress Notes (Signed)
    Subjective:  Patient reports pain as mild to moderate.  Denies N/V/CP/SOB.   Objective:   VITALS:   Vitals:   11/08/20 1600 11/08/20 1627 11/08/20 2306 11/09/20 0726  BP: (!) 151/68 (!) 157/69 (!) 127/54 122/65  Pulse: 78 79 78 75  Resp: 15 16 17 16   Temp:  97.8 F (36.6 C) 97.9 F (36.6 C) 98.5 F (36.9 C)  TempSrc:  Oral Oral Oral  SpO2: 96% 95% 93% 92%  Weight:      Height:        NAD ABD soft Neurovascular intact Sensation intact distally Intact pulses distally Dorsiflexion/Plantar flexion intact Incision: dressing C/D/I   Lab Results  Component Value Date   WBC 8.3 11/09/2020   HGB 10.0 (L) 11/09/2020   HCT 30.2 (L) 11/09/2020   MCV 86.5 11/09/2020   PLT 153 11/09/2020   BMET    Component Value Date/Time   NA 129 (L) 11/09/2020 0326   K 3.9 11/09/2020 0326   CL 99 11/09/2020 0326   CO2 23 11/09/2020 0326   GLUCOSE 120 (H) 11/09/2020 0326   BUN 9 11/09/2020 0326   CREATININE 0.49 11/09/2020 0326   CALCIUM 8.6 (L) 11/09/2020 0326   CALCIUM 9.9 10/16/2010 0959   GFRNONAA >60 11/09/2020 0326   GFRAA >60 07/24/2016 1110     Assessment/Plan: 1 Day Post-Op   Active Problems:   Closed right hip fracture (HCC)   WBAT with walker DVT ppx: Aspirin, SCDs, TEDS PO pain control PT/OT Dispo: Clear from an orthopedic standpoint for discharge. Follow up with Dr.Swinteck in 2 weeks     Dorothyann Peng 11/09/2020, 1:48 PM  [facility] is now [employer] [address] [place], [place], [place] [address] Phone: [phone] [email or web address] Facebook  [employer]

## 2020-11-09 NOTE — NC FL2 (Signed)
[place] LEVEL OF CARE SCREENING TOOL     IDENTIFICATION  Patient Name: [name] [name] Birthdate: 1929/09/30 Sex: female Admission Date (Current Location): 11/06/2020  [facility] and [place] Number:  Herbalist and Address:  [facility],  [place], [place]      Provider Number: [id number]  Attending Physician Name and Address:  Shelly Coss, MD  Relative Name and Phone Number:  Tretha Sciara (daughter) Ph: [phone]    Current Level of Care: Hospital Recommended Level of Care: [place] Prior Approval Number:    Date Approved/Denied:   PASRR Number: [id number]  Discharge Plan: SNF    Current Diagnoses: Patient Active Problem List   Diagnosis Date Noted   Closed right hip fracture ([place]) 11/07/2020   Chest pain 03/28/2015    Orientation RESPIRATION BLADDER Height & Weight     Self, Time, Situation, Place  O2 (2L/min) Continent Weight: 115 lb 15.4 oz (52.6 kg) Height:  5\' 4"  (162.6 cm)  BEHAVIORAL SYMPTOMS/MOOD NEUROLOGICAL BOWEL NUTRITION STATUS      Continent Diet (Regular diet)  AMBULATORY STATUS COMMUNICATION OF NEEDS Skin   Limited Assist Verbally Surgical wounds                       Personal Care Assistance Level of Assistance  Bathing, Feeding, Dressing Bathing Assistance: Limited assistance Feeding assistance: Independent Dressing Assistance: Limited assistance     Functional Limitations Info  Sight, Hearing, Speech Sight Info: Impaired Hearing Info: Adequate Speech Info: Adequate    SPECIAL CARE FACTORS FREQUENCY  PT (By licensed PT), OT (By licensed OT)     PT Frequency: 5x's/week OT Frequency: 5x's/week            Contractures Contractures Info: Not present    Additional Factors Info  Code Status, Allergies, Psychotropic Code Status Info: Full Allergies Info: Demerol, Iohexol, Suprax (Cefixime), Penicillins Psychotropic Info: Trazodone          Current Medications (11/09/2020):  This is the current hospital active medication list Current Facility-Administered Medications  Medication Dose Route Frequency Provider Last Rate Last Admin   acetaminophen (TYLENOL) tablet 325-650 mg  325-650 mg Oral Q6H PRN Swinteck, Aaron Edelman, MD       amLODipine (NORVASC) tablet 10 mg  10 mg Oral QHS Rod Can, MD   10 mg at 11/08/20 2217   aspirin EC tablet 325 mg  325 mg Oral Q breakfast Swinteck, Aaron Edelman, MD   325 mg at 11/09/20 0947   calcium carbonate (OS-CAL - dosed in mg of elemental calcium) tablet 500 mg of elemental calcium  1 tablet Oral Q breakfast Swinteck, Aaron Edelman, MD   500 mg of elemental calcium at 11/09/20 0947   docusate sodium (COLACE) capsule 100 mg  100 mg Oral BID Rod Can, MD   100 mg at 11/09/20 0947   estradiol (ESTRACE) vaginal cream 1 Applicatorful  1 Applicatorful Vaginal Once per day on Mon Wed Fri Swinteck, Aaron Edelman, MD       HYDROcodone-acetaminophen [facility]) 7.5-325 MG per tablet 1-2 tablet  1-2 tablet Oral Q4H PRN Rod Can, MD       HYDROcodone-acetaminophen (NORCO/VICODIN) 5-325 MG per tablet 1-2 tablet  1-2 tablet Oral Q4H PRN Rod Can, MD   2 tablet at 11/09/20 0645   levofloxacin (LEVAQUIN) tablet 250 mg  250 mg Oral Daily Rod Can, MD   250 mg at 11/09/20 0947   lidocaine (LIDODERM) 5 % 3 patch  3 patch Transdermal Q24H Rod Can, MD   3 patch at 11/07/20 0144   lisinopril (ZESTRIL) tablet 40 mg  40 mg Oral Daily Swinteck, Aaron Edelman, MD   40 mg at 11/09/20 0947   magnesium hydroxide (MILK OF MAGNESIA) suspension 30 mL  30 mL Oral Daily PRN Swinteck, Aaron Edelman, MD       menthol-cetylpyridinium (CEPACOL) lozenge 3 mg  1 lozenge Oral PRN Swinteck, Aaron Edelman, MD       Or   phenol (CHLORASEPTIC) mouth spray 1 spray  1 spray Mouth/Throat PRN Swinteck, Aaron Edelman, MD       methocarbamol (ROBAXIN) tablet 500 mg  500 mg Oral Q6H PRN Rod Can, MD   500 mg at 11/08/20 1710   Or   methocarbamol (ROBAXIN) 500 mg  in dextrose 5 % 50 mL IVPB  500 mg Intravenous Q6H PRN Swinteck, Aaron Edelman, MD       metoCLOPramide (REGLAN) tablet 5-10 mg  5-10 mg Oral Q8H PRN Swinteck, Aaron Edelman, MD       Or   metoCLOPramide (REGLAN) injection 5-10 mg  5-10 mg Intravenous Q8H PRN Swinteck, Aaron Edelman, MD       morphine 2 MG/ML injection 0.5-1 mg  0.5-1 mg Intravenous Q2H PRN Swinteck, Aaron Edelman, MD       omega-3 acid ethyl esters (LOVAZA) capsule 1 g  1 g Oral Daily Swinteck, Brian, MD   1 g at 11/09/20 0947   ondansetron (ZOFRAN) tablet 4 mg  4 mg Oral Q6H PRN Swinteck, Aaron Edelman, MD       Or   ondansetron (ZOFRAN) injection 4 mg  4 mg Intravenous Q6H PRN Swinteck, Aaron Edelman, MD       pantoprazole (PROTONIX) EC tablet 40 mg  40 mg Oral Q1200 Rod Can, MD   40 mg at 11/09/20 0954   polyethylene glycol (MIRALAX / GLYCOLAX) packet 17 g  17 g Oral Daily Rod Can, MD   17 g at 11/09/20 0948   rosuvastatin (CRESTOR) tablet 10 mg  10 mg Oral Leola Brazil, MD   10 mg at 11/09/20 5697   saccharomyces boulardii (FLORASTOR) capsule 250 mg  250 mg Oral Leola Brazil, MD   250 mg at 11/09/20 9480   senna (SENOKOT) tablet 8.6 mg  1 tablet Oral BID Rod Can, MD   8.6 mg at 11/09/20 0947   traZODone (DESYREL) tablet 25 mg  25 mg Oral QHS PRN Rod Can, MD         Discharge Medications: Please see discharge summary for a list of discharge medications.  Relevant Imaging Results:  Relevant Lab Results:   Additional Information SSN: [id number]  Sherie Don, LCSW

## 2020-11-09 NOTE — Anesthesia Postprocedure Evaluation (Signed)
Anesthesia Post Note  Patient: KIONA BLUME  Procedure(s) Performed: TOTAL HIP ARTHROPLASTY ANTERIOR APPROACH (Right: Hip)     Patient location during evaluation: PACU Anesthesia Type: MAC and Spinal Level of consciousness: awake and alert Pain management: pain level controlled Vital Signs Assessment: post-procedure vital signs reviewed and stable Respiratory status: spontaneous breathing, nonlabored ventilation and respiratory function stable Cardiovascular status: blood pressure returned to baseline and stable Postop Assessment: no apparent nausea or vomiting, no headache, no backache and spinal receding Anesthetic complications: no   No notable events documented.  Last Vitals:  Vitals:   11/08/20 1627 11/08/20 2306  BP: (!) 157/69 (!) 127/54  Pulse: 79 78  Resp: 16 17  Temp: 36.6 C 36.6 C  SpO2: 95%     Last Pain:  Vitals:   11/09/20 0645  TempSrc:   PainSc: [place]

## 2020-11-09 NOTE — Evaluation (Addendum)
Physical Therapy Evaluation Patient Details Name: [name] [name] MRN: [id number] DOB: 1929-11-27 Today's Date: 11/09/2020  History of Present Illness  Oral AGAPITA SAVARINO is a 87 y.o. female who presents s/p hip fracture repair using anterior approach. Medical history includes facial shingles with nerve damage/pain.  Clinical Impression  Pt admitted as above and presenting with decreased R LE strength/ROM, post op pain and ambulatory balance deficits limiting functional mobility.  Pt would benefit from follow up rehab at SNF level prior to return to living alone at [facility].      Recommendations for follow up therapy are one component of a multi-disciplinary discharge planning process, led by the attending physician.  Recommendations may be updated based on patient status, additional functional criteria and insurance authorization.  Follow Up Recommendations SNF    Equipment Recommendations  None recommended by PT    Recommendations for Other Services       Precautions / Restrictions Precautions Precautions: Fall Restrictions Weight Bearing Restrictions: No RLE Weight Bearing: Weight bearing as tolerated      Mobility  Bed Mobility Overal bed mobility: Needs Assistance Bed Mobility: Supine to Sit     Supine to sit: Min assist     General bed mobility comments: Increased time with cues for sequence and use of L LE to self assist    Transfers Overall transfer level: Needs assistance Equipment used: Rolling walker (2 wheeled) Transfers: Sit to/from Stand Sit to Stand: Min assist;From elevated surface         General transfer comment: cues for LE management and use of UEs to self assist  Ambulation/Gait Ambulation/Gait assistance: Min assist Gait Distance (Feet): 100 Feet Assistive device: Rolling walker (2 wheeled) Gait Pattern/deviations: Step-to pattern;Step-through pattern;Decreased step length - right;Decreased step length -  left;Shuffle;Antalgic;Trunk flexed Gait velocity: decr   General Gait Details: cues for posture, position from RW and initial sequence  Stairs            Wheelchair Mobility    Modified Rankin (Stroke Patients Only)       Balance Overall balance assessment: Needs assistance Sitting-balance support: No upper extremity supported;Feet supported Sitting balance-Leahy Scale: Good     Standing balance support: Bilateral upper extremity supported Standing balance-Leahy Scale: Poor                               Pertinent Vitals/Pain Pain Assessment: Faces Pain Score: 2  Faces Pain Scale: Hurts a little bit Pain Location: R hip Pain Descriptors / Indicators: Sore Pain Intervention(s): Monitored during session;Premedicated before session    Home Living Family/patient expects to be discharged to:: Skilled nursing facility                      Prior Function Level of Independence: Independent;Independent with assistive device(s)         Comments: I carried a cane when I went for my mile long walks     Hand Dominance   Dominant Hand: Right    Extremity/Trunk Assessment   Upper Extremity Assessment Upper Extremity Assessment: Overall WFL for tasks assessed    Lower Extremity Assessment Lower Extremity Assessment: RLE deficits/detail       Communication   Communication: No difficulties  Cognition Arousal/Alertness: Awake/alert Behavior During Therapy: WFL for tasks assessed/performed Overall Cognitive Status: Within Functional Limits for tasks assessed  General Comments: Pt repeating same information multiple times      General Comments      Exercises Total Joint Exercises Ankle Circles/Pumps: AROM;Both;15 reps;Supine   Assessment/Plan    PT Assessment Patient needs continued PT services  PT Problem List Decreased strength;Decreased range of motion;Decreased activity  tolerance;Decreased balance;Decreased mobility;Decreased cognition;Decreased knowledge of use of DME;Pain       PT Treatment Interventions DME instruction;Gait training;Stair training;Functional mobility training;Therapeutic activities;Therapeutic exercise;Balance training;Patient/family education    PT Goals (Current goals can be found in the Care Plan section)  Acute Rehab PT Goals Patient Stated Goal: Regain ind to return to IND living PT Goal Formulation: With patient Time For Goal Achievement: 11/16/20 Potential to Achieve Goals: Good    Frequency Min 5X/week   Barriers to discharge        Co-evaluation PT/OT/SLP Co-Evaluation/Treatment: Yes Reason for Co-Treatment: To address functional/ADL transfers PT goals addressed during session: Mobility/safety with mobility OT goals addressed during session: ADL's and self-care       AM-PAC PT "6 Clicks" Mobility  Outcome Measure Help needed turning from your back to your side while in a flat bed without using bedrails?: A Lot Help needed moving from lying on your back to sitting on the side of a flat bed without using bedrails?: A Little Help needed moving to and from a bed to a chair (including a wheelchair)?: A Little Help needed standing up from a chair using your arms (e.g., wheelchair or bedside chair)?: A Little Help needed to walk in hospital room?: A Little Help needed climbing 3-5 steps with a railing? : A Lot 6 Click Score: 16    End of Session Equipment Utilized During Treatment: Gait belt;Oxygen Activity Tolerance: Patient tolerated treatment well Patient left: in chair;with call bell/phone within reach;with chair alarm set;with family/visitor present Nurse Communication: Mobility status PT Visit Diagnosis: Unsteadiness on feet (R26.81);Difficulty in walking, not elsewhere classified (R26.2);Pain Pain - Right/Left: Right Pain - part of body: Hip    Time: 4967-5916 PT Time Calculation (min) (ACUTE ONLY): 33  min   Charges:   PT Evaluation $PT Eval Low Complexity: 1 Low          [place] Pager [phone] Office [phone]   Massie Mees 11/09/2020, 9:25 AM

## 2020-11-09 NOTE — Progress Notes (Signed)
Physical Therapy Treatment Patient Details Name: [name] [name] MRN: [id number] DOB: Aug 23, 1929 Today's Date: 11/09/2020   History of Present Illness [name] [name] is a 87 y.o. female who presents s/p hip fracture repair using anterior approach. Medical history includes facial shingles with nerve damage/pain.    PT Comments    Pt continues very motivated and up to ambulate in hall and assisted to bed to perform therex program.  Pt with increased pain this pm and RN alerted.   Recommendations for follow up therapy are one component of a multi-disciplinary discharge planning process, led by the attending physician.  Recommendations may be updated based on patient status, additional functional criteria and insurance authorization.  Follow Up Recommendations  SNF     Equipment Recommendations  None recommended by PT    Recommendations for Other Services       Precautions / Restrictions Precautions Precautions: Fall Restrictions Weight Bearing Restrictions: No RLE Weight Bearing: Weight bearing as tolerated     Mobility  Bed Mobility Overal bed mobility: Needs Assistance Bed Mobility: Sit to Supine     Supine to sit: Min assist Sit to supine: Min assist;Mod assist   General bed mobility comments: Increased time with cues for sequence and use of L LE to self assist    Transfers Overall transfer level: Needs assistance Equipment used: Rolling walker (2 wheeled) Transfers: Sit to/from Stand Sit to Stand: Min assist;From elevated surface         General transfer comment: cues for LE management and use of UEs to self assist  Ambulation/Gait Ambulation/Gait assistance: Min assist Gait Distance (Feet): 60 Feet Assistive device: Rolling walker (2 wheeled) Gait Pattern/deviations: Step-to pattern;Step-through pattern;Decreased step length - right;Decreased step length - left;Shuffle;Antalgic;Trunk flexed Gait velocity: decr   General Gait Details: cues for posture,  position from RW and initial sequence   Stairs             Wheelchair Mobility    Modified Rankin (Stroke Patients Only)       Balance Overall balance assessment: Needs assistance Sitting-balance support: No upper extremity supported;Feet supported Sitting balance-Leahy Scale: Good     Standing balance support: Bilateral upper extremity supported Standing balance-Leahy Scale: Poor Standing balance comment: reliant on walker                            Cognition Arousal/Alertness: Awake/alert Behavior During Therapy: WFL for tasks assessed/performed Overall Cognitive Status: Within Functional Limits for tasks assessed                                 General Comments: Pt repeating same information multiple times      Exercises Total Joint Exercises Ankle Circles/Pumps: AROM;Both;15 reps;Supine Quad Sets: AROM;Both;10 reps;Supine Heel Slides: AAROM;Right;20 reps;Supine Hip ABduction/ADduction: AAROM;Right;15 reps;Supine    General Comments        Pertinent Vitals/Pain Pain Assessment: Faces Pain Score: 2  Faces Pain Scale: Hurts little more Pain Location: R hip Pain Descriptors / Indicators: Sore Pain Intervention(s): Limited activity within patient's tolerance;Monitored during session;Patient requesting pain meds-RN notified    Home Living Family/patient expects to be discharged to:: Skilled nursing facility                    Prior Function Level of Independence: Independent;Independent with assistive device(s)      Comments: I carried a cane when I  went for my mile long walks   PT Goals (current goals can now be found in the care plan section) Acute Rehab PT Goals Patient Stated Goal: Regain ind to return to IND living PT Goal Formulation: With patient Time For Goal Achievement: 11/16/20 Potential to Achieve Goals: Good Progress towards PT goals: Progressing toward goals    Frequency    Min 5X/week       PT Plan Current plan remains appropriate    Co-evaluation PT/OT/SLP Co-Evaluation/Treatment: Yes Reason for Co-Treatment: For patient/therapist safety;To address functional/ADL transfers PT goals addressed during session: Mobility/safety with mobility OT goals addressed during session: ADL's and self-care      AM-PAC PT "6 Clicks" Mobility   Outcome Measure  Help needed turning from your back to your side while in a flat bed without using bedrails?: A Lot Help needed moving from lying on your back to sitting on the side of a flat bed without using bedrails?: A Little Help needed moving to and from a bed to a chair (including a wheelchair)?: A Little Help needed standing up from a chair using your arms (e.g., wheelchair or bedside chair)?: A Little Help needed to walk in hospital room?: A Little Help needed climbing 3-5 steps with a railing? : A Lot 6 Click Score: 16    End of Session Equipment Utilized During Treatment: Gait belt Activity Tolerance: Patient tolerated treatment well Patient left: in bed;with call bell/phone within reach;with family/visitor present;with bed alarm set Nurse Communication: Mobility status PT Visit Diagnosis: Unsteadiness on feet (R26.81);Difficulty in walking, not elsewhere classified (R26.2);Pain Pain - Right/Left: Right Pain - part of body: Hip     Time: 1157-1227 PT Time Calculation (min) (ACUTE ONLY): 30 min  Charges:  $Gait Training: 8-22 mins $Therapeutic Exercise: 8-22 mins                     [place] Pager [phone] Office [phone]    Jeffey Janssen 11/09/2020, 12:41 PM

## 2020-11-09 NOTE — Plan of Care (Signed)

## 2020-11-09 NOTE — Evaluation (Signed)
Occupational Therapy Evaluation Patient Details Name: [name] [name] MRN: [id number] DOB: 1929-03-22 Today's Date: 11/09/2020   History of Present Illness Baleria ALLEYA DEMETER is a 87 y.o. female who presents s/p hip fracture repair using anterior approach. Medical history includes facial shingles with nerve damage/pain.   Clinical Impression   Mrs. Chloris Marcoux is an 87 year old woman who presents s/p hip fracture with decreased RoM and strength of RLE, decreased activity tolerance, impaired balance and mild complaints of pain. Overall patient min assist for mobility and mod assist for LB ADLs. Patient will benefit from skilled OT services while in hospital to improve deficits and learn compensatory strategies as needed in order to return to PLOF.        Recommendations for follow up therapy are one component of a multi-disciplinary discharge planning process, led by the attending physician.  Recommendations may be updated based on patient status, additional functional criteria and insurance authorization.   Follow Up Recommendations  SNF    Equipment Recommendations  None recommended by OT    Recommendations for Other Services       Precautions / Restrictions Precautions Precautions: Fall Restrictions Weight Bearing Restrictions: No RLE Weight Bearing: Weight bearing as tolerated      Mobility Bed Mobility Overal bed mobility: Needs Assistance Bed Mobility: Supine to Sit     Supine to sit: Min assist     General bed mobility comments: Increased time with cues for sequence and use of L LE to self assist    Transfers Overall transfer level: Needs assistance Equipment used: Rolling walker (2 wheeled) Transfers: Sit to/from Stand Sit to Stand: Min assist;From elevated surface         General transfer comment: cues for LE management and use of UEs to self assist    Balance Overall balance assessment: Needs assistance Sitting-balance support: No upper extremity  supported;Feet supported Sitting balance-Leahy Scale: Good     Standing balance support: Bilateral upper extremity supported Standing balance-Leahy Scale: Poor Standing balance comment: reliant on walker                           ADL either performed or assessed with clinical judgement   ADL Overall ADL's : Needs assistance/impaired Eating/Feeding: Independent   Grooming: Set up;Sitting   Upper Body Bathing: Set up;Sitting   Lower Body Bathing: Moderate assistance;Sit to/from stand   Upper Body Dressing : Set up;Sitting   Lower Body Dressing: Moderate assistance;Sit to/from stand   Toilet Transfer: Min guard;Regular Toilet;Grab bars;RW   Toileting- Water quality scientist and Hygiene: Minimal assistance;Sit to/from stand       Functional mobility during ADLs: Min guard;Rolling walker       Vision Patient Visual Report: No change from baseline       Perception     Praxis      Pertinent Vitals/Pain Pain Assessment: Faces Pain Score: 2  Faces Pain Scale: Hurts a little bit Pain Location: R hip Pain Descriptors / Indicators: Sore Pain Intervention(s): Monitored during session;Premedicated before session     Hand Dominance Right   Extremity/Trunk Assessment Upper Extremity Assessment Upper Extremity Assessment: Overall WFL for tasks assessed   Lower Extremity Assessment Lower Extremity Assessment: Defer to PT evaluation   Cervical / Trunk Assessment Cervical / Trunk Assessment: Kyphotic   Communication Communication Communication: No difficulties   Cognition Arousal/Alertness: Awake/alert Behavior During Therapy: WFL for tasks assessed/performed Overall Cognitive Status: Within Functional Limits for tasks assessed  General Comments: Pt repeating same information multiple times   General Comments       Exercises Exercises: Total Joint Total Joint Exercises Ankle Circles/Pumps: AROM;Both;15  reps;Supine   Shoulder Instructions      Home Living Family/patient expects to be discharged to:: Skilled nursing facility                                        Prior Functioning/Environment Level of Independence: Independent;Independent with assistive device(s)        Comments: I carried a cane when I went for my mile long walks        OT Problem List: Decreased strength;Decreased range of motion;Decreased activity tolerance;Impaired balance (sitting and/or standing);Pain;Decreased knowledge of use of DME or AE      OT Treatment/Interventions: Self-care/ADL training;Therapeutic exercise;DME and/or AE instruction;Therapeutic activities;Balance training;Patient/family education    OT Goals(Current goals can be found in the care plan section) Acute Rehab OT Goals Patient Stated Goal: Regain ind to return to IND living OT Goal Formulation: With patient Time For Goal Achievement: 11/23/20 Potential to Achieve Goals: Good  OT Frequency: Min 2X/week   Barriers to D/C:            Co-evaluation PT/OT/SLP Co-Evaluation/Treatment: Yes Reason for Co-Treatment: For patient/therapist safety;To address functional/ADL transfers PT goals addressed during session: Mobility/safety with mobility OT goals addressed during session: ADL's and self-care      AM-PAC OT "6 Clicks" Daily Activity     Outcome Measure Help from another person eating meals?: None Help from another person taking care of personal grooming?: A Little Help from another person toileting, which includes using toliet, bedpan, or urinal?: A Little Help from another person bathing (including washing, rinsing, drying)?: A Lot Help from another person to put on and taking off regular upper body clothing?: A Little Help from another person to put on and taking off regular lower body clothing?: A Lot 6 Click Score: 17   End of Session Equipment Utilized During Treatment: Rolling walker;Gait belt Nurse  Communication: Mobility status  Activity Tolerance: Patient tolerated treatment well Patient left: in chair;with call bell/phone within reach;with chair alarm set;with family/visitor present  OT Visit Diagnosis: Other abnormalities of gait and mobility (R26.89);Pain Pain - Right/Left: Right Pain - part of body: Hip                Time: 1610-9604 OT Time Calculation (min): 28 min Charges:  OT General Charges $OT Visit: 1 Visit OT Evaluation $OT Eval Low Complexity: 1 Low  Margene Cherian, OTR/L [employer]  Office [phone] Pager: [place] 11/09/2020, 9:39 AM

## 2020-11-10 ENCOUNTER — Inpatient Hospital Stay (HOSPITAL_COMMUNITY): Payer: Medicare Other

## 2020-11-10 DIAGNOSIS — S72001A Fracture of unspecified part of neck of right femur, initial encounter for closed fracture: Secondary | ICD-10-CM | POA: Diagnosis not present

## 2020-11-10 LAB — BASIC METABOLIC PANEL
Anion gap: 10 (ref 5–15)
BUN: 6 mg/dL — ABNORMAL LOW (ref 8–23)
CO2: 25 mmol/L (ref 22–32)
Calcium: 9 mg/dL (ref 8.9–10.3)
Chloride: 94 mmol/L — ABNORMAL LOW (ref 98–111)
Creatinine, Ser: 0.46 mg/dL (ref 0.44–1.00)
GFR, Estimated: >60 mL/min (ref >60)
Glucose, Bld: 126 mg/dL — ABNORMAL HIGH (ref 70–99)
Potassium: 3.5 mmol/L (ref 3.5–5.1)
Sodium: 129 mmol/L — ABNORMAL LOW (ref 135–145)

## 2020-11-10 LAB — CBC
HCT: 30.8 % — ABNORMAL LOW (ref 36.0–46.0)
Hemoglobin: 10.2 g/dL — ABNORMAL LOW (ref 12.0–15.0)
MCH: 28.6 pg (ref 26.0–34.0)
MCHC: 33.1 g/dL (ref 30.0–36.0)
MCV: 86.3 fL (ref 80.0–100.0)
Platelets: 179 10*3/uL (ref 150–400)
RBC: 3.57 MIL/uL — ABNORMAL LOW (ref 3.87–5.11)
RDW: 12.9 % (ref 11.5–15.5)
WBC: 8.7 10*3/uL (ref 4.0–10.5)
nRBC: 0 % (ref 0.0–0.2)

## 2020-11-10 MED ORDER — BISACODYL 10 MG RE SUPP
10.0000 mg | Freq: Once | RECTAL | Status: AC
  Administered 2020-11-10: 10 mg via RECTAL
  Filled 2020-11-10: qty 1

## 2020-11-10 NOTE — Progress Notes (Signed)
Physical Therapy Treatment Patient Details Name: [name] [name] MRN: [id number] DOB: [birth year] Today's Date: 11/10/2020   History of Present Illness [name] [name] is a 87 y.o. female who presents s/p hip fracture repair using anterior approach. Medical history includes facial shingles with nerve damage/pain.    PT Comments    Pt continues very motivated and progressing well with mobility with noted increased activity tolerance and improved standing balance.   Recommendations for follow up therapy are one component of a multi-disciplinary discharge planning process, led by the attending physician.  Recommendations may be updated based on patient status, additional functional criteria and insurance authorization.  Follow Up Recommendations  SNF     Equipment Recommendations  None recommended by PT    Recommendations for Other Services       Precautions / Restrictions Precautions Precautions: Fall Restrictions Weight Bearing Restrictions: No RLE Weight Bearing: Weight bearing as tolerated     Mobility  Bed Mobility               General bed mobility comments: Pt up in bathroom on arrival and in chair at session end    Transfers Overall transfer level: Needs assistance Equipment used: Rolling walker (2 wheeled) Transfers: Sit to/from Stand Sit to Stand: Min guard         General transfer comment: cues for LE management and use of UEs to self assist  Ambulation/Gait Ambulation/Gait assistance: Min guard Gait Distance (Feet): 200 Feet Assistive device: Rolling walker (2 wheeled) Gait Pattern/deviations: Step-to pattern;Step-through pattern;Decreased step length - right;Decreased step length - left;Shuffle;Antalgic;Trunk flexed Gait velocity: decr   General Gait Details: cues for posture, position from RW and initial sequence   Stairs             Wheelchair Mobility    Modified Rankin (Stroke Patients Only)       Balance Overall balance  assessment: Needs assistance Sitting-balance support: No upper extremity supported;Feet supported Sitting balance-Leahy Scale: Good     Standing balance support: No upper extremity supported Standing balance-Leahy Scale: Fair                              Cognition Arousal/Alertness: Awake/alert Behavior During Therapy: WFL for tasks assessed/performed Overall Cognitive Status: Within Functional Limits for tasks assessed                                        Exercises      General Comments        Pertinent Vitals/Pain Pain Assessment: Faces Faces Pain Scale: Hurts little more Pain Location: R hip Pain Descriptors / Indicators: Sore Pain Intervention(s): Limited activity within patient's tolerance;Monitored during session;Patient requesting pain meds-RN notified;Ice applied    Home Living                      Prior Function            PT Goals (current goals can now be found in the care plan section) Acute Rehab PT Goals Patient Stated Goal: Regain ind to return to IND living PT Goal Formulation: With patient Time For Goal Achievement: 11/16/20 Potential to Achieve Goals: Good Progress towards PT goals: Progressing toward goals    Frequency    Min 5X/week      PT Plan Current plan remains appropriate  Co-evaluation              AM-PAC PT "6 Clicks" Mobility   Outcome Measure  Help needed turning from your back to your side while in a flat bed without using bedrails?: A Lot Help needed moving from lying on your back to sitting on the side of a flat bed without using bedrails?: A Little Help needed moving to and from a bed to a chair (including a wheelchair)?: A Little Help needed standing up from a chair using your arms (e.g., wheelchair or bedside chair)?: A Little Help needed to walk in hospital room?: A Little Help needed climbing 3-5 steps with a railing? : A Lot 6 Click Score: 16    End of Session  Equipment Utilized During Treatment: Gait belt Activity Tolerance: Patient tolerated treatment well Patient left: in bed;with call bell/phone within reach;with family/visitor present;with bed alarm set Nurse Communication: Mobility status PT Visit Diagnosis: Unsteadiness on feet (R26.81);Difficulty in walking, not elsewhere classified (R26.2);Pain Pain - Right/Left: Right Pain - part of body: Hip     Time: 1150-1206 PT Time Calculation (min) (ACUTE ONLY): 16 min  Charges:  $Gait Training: 8-22 mins                     [place] Pager [phone] Office [phone]    Eulalie Speights 11/10/2020, 12:14 PM

## 2020-11-10 NOTE — Progress Notes (Signed)
Subjective: 2 Days Post-Op Procedure(s) (LRB): TOTAL HIP ARTHROPLASTY ANTERIOR APPROACH (Right) Patient reports pain as mild.    Objective: Vital signs in last 24 hours: Temp:  [98 F (36.7 C)-98.4 F (36.9 C)] 98.4 F (36.9 C) (09/24 0610) Pulse Rate:  [72-79] 72 (09/24 0610) Resp:  [16-18] 16 (09/24 0610) BP: (108-158)/(58-77) 108/61 (09/24 0610) SpO2:  [94 %-97 %] 97 % (09/24 0610)  Intake/Output from previous day: 09/23 0701 - 09/24 0700 In: 1353.8 [P.O.:1090; I.V.:263.8] Out: 501 [Urine:501] Intake/Output this shift: No intake/output data recorded.  Recent Labs    11/08/20 0336 11/09/20 0326 11/10/20 0329  HGB 12.1 10.0* 10.2*   Recent Labs    11/09/20 0326 11/10/20 0329  WBC 8.3 8.7  RBC 3.49* 3.57*  HCT 30.2* 30.8*  PLT 153 179   Recent Labs    11/09/20 0326 11/10/20 0329  NA 129* 129*  K 3.9 3.5  CL 99 94*  CO2 23 25  BUN 9 6*  CREATININE 0.49 0.46  GLUCOSE 120* 126*  CALCIUM 8.6* 9.0   No results for input(s): LABPT, INR in the last 72 hours.  Neurologically intact Neurovascular intact Sensation intact distally Intact pulses distally Dorsiflexion/Plantar flexion intact Incision: dressing C/D/I No cellulitis present Compartment soft   Assessment/Plan: 2 Days Post-Op Procedure(s) (LRB): TOTAL HIP ARTHROPLASTY ANTERIOR APPROACH (Right) Advance diet Up with therapy Discharge to SNF, waiting for SNF placement      Roby Spalla R Edwards Mckelvie 11/10/2020, 9:16 AM

## 2020-11-10 NOTE — Progress Notes (Signed)
PROGRESS NOTE    [name] [name]  GXQ:[id number] DOB: 1929-09-06 DOA: 11/06/2020 PCP: Lajean Manes, MD   Chief Complain: Fall  Brief Narrative:  Patient is a 87 year old female with history of hypertension, hyperlipidemia, anxiety who presented here after she fell at home when she was getting up from the chair and lost her balance.  She fell on her right side and was unable to ambulate and also developed pain on the right hip.  On presentation she was hypertensive, lab work showed hypokalemia, CBC showed elevated white cells.  CT was was negative for acute intracranial abnormalities.  Knee x-ray did not show any fracture or dislocation.  Pelvic CT showed impacted fracture of the right femoral neck.  Orthopedics consulted, she underwent  right total arthroplasty on 9.22.22.  PT/OT recommended skilled nursing facility on discharge.  She is medically stable for discharge to SNF as soon as bed is available.  Assessment & Plan:   Active Problems:   Closed right hip fracture (HCC)   Closed impacted right hip fracture/right femoral neck fracture: Secondary to mechanical fall.  Lives at home.  Continue pain management, supportive care.  Orthopedics did  right totalarthroplasty .   PT/OT recommended skilled nursing facility on discharge.  Hypertension: On Norvasc, Accupril and hydrochlorothiazide at home.  Monitor blood pressure.  Blood pressure stable  Leukocytosis:  Most likely reactive.  Resolved  Hyponatremia: Na of 129 today. Continue to monitor  GERD: On PPI  Recent history of UTI: She was recently started on treatment by her PCP with levofloxacin to 250 mg for 5 days.  Currently being continued  Hyperlipidemia: On Crestor  Constipation: Continue bowel regimen.Ordered dulcolax today         DVT prophylaxis:SCD Code Status: Full Family Communication: Daughter present at the bedside Status is: Inpatient  Remains inpatient appropriate because:unsafe dc plan  Dispo: The  patient is from: Home              Anticipated d/c is to: SNF              Patient currently is medically stable for discharge   Difficult to place patient No  Consultants: Orthopedics  Procedures: Plan for hemiarthroplasty  Antimicrobials:  Anti-infectives (From admission, onward)    Start     Dose/Rate Route Frequency Ordered Stop   11/08/20 1900  ceFAZolin (ANCEF) IVPB 2g/100 mL premix        2 g 200 mL/hr over 30 Minutes Intravenous Every 6 hours 11/08/20 1630 11/09/20 0128   11/08/20 1130  ceFAZolin (ANCEF) IVPB 2g/100 mL premix        2 g 200 mL/hr over 30 Minutes Intravenous On call to O.R. 11/08/20 1126 11/08/20 1333   11/08/20 1129  ceFAZolin (ANCEF) 2-4 GM/100ML-% IVPB       Note to Pharmacy: Randa Evens  : cabinet override      11/08/20 1129 11/08/20 1305   11/07/20 1600  levofloxacin (LEVAQUIN) tablet 250 mg        250 mg Oral Daily 11/07/20 1245 11/12/20 0959       Subjective:  Patient seen and examined at the bedside this morning.  Medically stable.  Comfortable.  No new complaints except for lack of bowel movement  Objective: Vitals:   11/09/20 0726 11/09/20 1426 11/09/20 2218 11/10/20 0610  BP: 122/65 (!) 117/58 (!) 158/77 108/61  Pulse: 75 79 79 72  Resp: 16 18 17 16   Temp: 98.5 F (36.9 C) 98.4 F (36.9 C) 98  F (36.7 C) 98.4 F (36.9 C)  TempSrc: Oral Oral Oral Oral  SpO2: 92% 94% 96% 97%  Weight:      Height:        Intake/Output Summary (Last 24 hours) at 11/10/2020 1117 Last data filed at 11/10/2020 0700 Gross per 24 hour  Intake 970 ml  Output 501 ml  Net 469 ml   Filed Weights   11/07/20 0612  Weight: 52.6 kg    Examination:  General exam: Overall comfortable, not in distress HEENT: PERRL Respiratory system:  no wheezes or crackles  Cardiovascular system: S1 & S2 heard, RRR.  Gastrointestinal system: Abdomen is nondistended, soft and nontender. Central nervous system: Alert and oriented Extremities: No edema, no  clubbing ,no cyanosis, surgical wound on the right hip Skin: No rashes, no ulcers,no icterus       Data Reviewed: I have personally reviewed following labs and imaging studies  CBC: Recent Labs  Lab 11/07/20 0259 11/08/20 0336 11/09/20 0326 11/10/20 0329  WBC 16.2* 15.4* 8.3 8.7  NEUTROABS  --  13.8* 6.6  --   HGB 14.4 12.1 10.0* 10.2*  HCT 42.8 35.9* 30.2* 30.8*  MCV 86.1 85.7 86.5 86.3  PLT 271 198 153 017   Basic Metabolic Panel: Recent Labs  Lab 11/07/20 0259 11/08/20 0336 11/09/20 0326 11/10/20 0329  NA 130* 125* 129* 129*  K 3.4* 4.5 3.9 3.5  CL 94* 97* 99 94*  CO2 24 22 23 25   GLUCOSE 132* 140* 120* 126*  BUN 11 7* 9 6*  CREATININE 0.48 0.47 0.49 0.46  CALCIUM 9.7 8.9 8.6* 9.0   GFR: Estimated Creatinine Clearance: 39.6 mL/min (by C-G formula based on SCr of 0.46 mg/dL). Liver Function Tests: No results for input(s): AST, ALT, ALKPHOS, BILITOT, PROT, ALBUMIN in the last 168 hours. No results for input(s): LIPASE, AMYLASE in the last 168 hours. No results for input(s): AMMONIA in the last 168 hours. Coagulation Profile: Recent Labs  Lab 11/07/20 0259  INR 1.0   Cardiac Enzymes: No results for input(s): CKTOTAL, CKMB, CKMBINDEX, TROPONINI in the last 168 hours. BNP (last 3 results) No results for input(s): PROBNP in the last 8760 hours. HbA1C: No results for input(s): HGBA1C in the last 72 hours. CBG: No results for input(s): GLUCAP in the last 168 hours. Lipid Profile: No results for input(s): CHOL, HDL, LDLCALC, TRIG, CHOLHDL, LDLDIRECT in the last 72 hours. Thyroid Function Tests: Recent Labs    11/08/20 1653  TSH 3.084   Anemia Panel: No results for input(s): VITAMINB12, FOLATE, FERRITIN, TIBC, IRON, RETICCTPCT in the last 72 hours. Sepsis Labs: No results for input(s): PROCALCITON, LATICACIDVEN in the last 168 hours.  Recent Results (from the past 240 hour(s))  SARS CORONAVIRUS 2 (TAT 6-24 HRS) Nasopharyngeal Nasopharyngeal Swab      Status: None   Collection Time: 11/07/20  2:59 AM   Specimen: Nasopharyngeal Swab  Result Value Ref Range Status   SARS Coronavirus 2 NEGATIVE NEGATIVE Final    Comment: (NOTE) SARS-CoV-2 target nucleic acids are NOT DETECTED.  The SARS-CoV-2 RNA is generally detectable in upper and lower respiratory specimens during the acute phase of infection. Negative results do not preclude SARS-CoV-2 infection, do not rule out co-infections with other pathogens, and should not be used as the sole basis for treatment or other patient management decisions. Negative results must be combined with clinical observations, patient history, and epidemiological information. The expected result is Negative.  Fact Sheet for Patients: SugarRoll.be  Fact Sheet for  Healthcare Providers: https://www.woods-mathews.com/  This test is not yet approved or cleared by the [place] and  has been authorized for detection and/or diagnosis of SARS-CoV-2 by FDA under an Emergency Use Authorization (EUA). This EUA will remain  in effect (meaning this test can be used) for the duration of the COVID-19 declaration under Se ction 564(b)(1) of the Act, 21 U.S.C. section 360bbb-3(b)(1), unless the authorization is terminated or revoked sooner.  Performed at [facility], [place] [address], [place], [place] [address]   MRSA Next Gen by PCR, Nasal     Status: None   Collection Time: 11/08/20  5:40 AM   Specimen: Nasal Mucosa; Nasal Swab  Result Value Ref Range Status   MRSA by PCR Next Gen NOT DETECTED NOT DETECTED Final    Comment: (NOTE) The GeneXpert MRSA Assay (FDA approved for NASAL specimens only), is one component of a comprehensive MRSA colonization surveillance program. It is not intended to diagnose MRSA infection nor to guide or monitor treatment for MRSA infections. Test performance is not FDA approved in patients less than 66 years old. Performed at  [facility], [place] [address], [place], [place] [address]          Radiology Studies: Pelvis Portable  Result Date: 11/08/2020 CLINICAL DATA:  Status post right hip replacement EXAM: PORTABLE PELVIS 1 VIEWS COMPARISON:  Intraoperative films from earlier in the same day. FINDINGS: Right hip prosthesis is noted in satisfactory position. Proximal cerclage wire is noted as well. No acute soft tissue abnormality is seen. IMPRESSION: Status post right hip replacement. Electronically Signed   By: Inez Catalina M.D.   On: 11/08/2020 18:57   DG C-Arm 1-60 Min-No Report  Result Date: 11/08/2020 Fluoroscopy was utilized by the requesting physician.  No radiographic interpretation.   DG HIP OPERATIVE UNILAT W OR W/O PELVIS RIGHT  Result Date: 11/08/2020 CLINICAL DATA:  Total hip arthroplasty. EXAM: OPERATIVE RIGHT HIP (WITH PELVIS IF PERFORMED) TECHNIQUE: Fluoroscopic spot image(s) were submitted for interpretation post-operatively. COMPARISON:  Preoperative radiograph 11/06/2020 FINDINGS: Two fluoroscopic spot views of the pelvis and right hip obtained in the operating room. Right hip arthroplasty with cerclage wire fixation. Fluoroscopy time 14 seconds. IMPRESSION: Procedural fluoroscopy for right hip arthroplasty. Electronically Signed   By: Keith Rake M.D.   On: 11/08/2020 15:57        Scheduled Meds:  amLODipine  10 mg Oral QHS   aspirin EC  325 mg Oral Q breakfast   calcium carbonate  1 tablet Oral Q breakfast   docusate sodium  100 mg Oral BID   estradiol  1 Applicatorful Vaginal Once per day on Mon Wed Fri   guaiFENesin  600 mg Oral BID   levofloxacin  250 mg Oral Daily   lidocaine  3 patch Transdermal Q24H   lisinopril  40 mg Oral Daily   omega-3 acid ethyl esters  1 g Oral Daily   pantoprazole  40 mg Oral Daily   polyethylene glycol  17 g Oral Daily   rosuvastatin  10 mg Oral QODAY   saccharomyces boulardii  250 mg Oral QODAY   senna  1 tablet Oral BID    Continuous Infusions:  methocarbamol (ROBAXIN) IV       LOS: 3 days    Time spent: 25 mins.More than 50% of that time was spent in counseling and/or coordination of care.      Shelly Coss, MD [facility] P9/24/2022, 11:17 AM

## 2020-11-11 ENCOUNTER — Encounter (HOSPITAL_COMMUNITY): Payer: Self-pay | Admitting: Orthopedic Surgery

## 2020-11-11 DIAGNOSIS — S72001A Fracture of unspecified part of neck of right femur, initial encounter for closed fracture: Secondary | ICD-10-CM | POA: Diagnosis not present

## 2020-11-11 LAB — CBC
HCT: 27.4 % — ABNORMAL LOW (ref 36.0–46.0)
Hemoglobin: 9.4 g/dL — ABNORMAL LOW (ref 12.0–15.0)
MCH: 28.8 pg (ref 26.0–34.0)
MCHC: 34.3 g/dL (ref 30.0–36.0)
MCV: 84 fL (ref 80.0–100.0)
Platelets: 222 10*3/uL (ref 150–400)
RBC: 3.26 MIL/uL — ABNORMAL LOW (ref 3.87–5.11)
RDW: 13 % (ref 11.5–15.5)
WBC: 8.6 10*3/uL (ref 4.0–10.5)
nRBC: 0 % (ref 0.0–0.2)

## 2020-11-11 LAB — RESP PANEL BY RT-PCR (FLU A&B, COVID) ARPGX2
Influenza A by PCR: NEGATIVE
Influenza B by PCR: NEGATIVE
SARS Coronavirus 2 by RT PCR: NEGATIVE

## 2020-11-11 MED ORDER — POLYETHYLENE GLYCOL 3350 17 G PO PACK: 17.0000 g | PACK | Freq: Every day | ORAL | 0 refills | Status: AC

## 2020-11-11 MED ORDER — SENNA 8.6 MG PO TABS: 1.0000 | ORAL_TABLET | Freq: Two times a day (BID) | ORAL | 0 refills | Status: DC

## 2020-11-11 NOTE — Discharge Summary (Addendum)
Physician Discharge Summary  [name] [name] PZW:[id number] DOB: [birth year] DOA: 11/06/2020  PCP: Lajean Manes, MD  Admit date: 11/06/2020 Discharge date: 11/12/2020  Admitted From: ILF Disposition:  SNF  Discharge Condition:Stable CODE STATUS:FULL Diet recommendation: Regular  Brief/Interim Summary: Patient is a 87 year old female with history of hypertension, hyperlipidemia, anxiety who presented here after she fell at home when she was getting up from the chair and lost her balance.  She fell on her right side and was unable to ambulate and also developed pain on the right hip.  On presentation she was hypertensive, lab work showed hypokalemia, CBC showed elevated white cells.  CT was was negative for acute intracranial abnormalities.  Knee x-ray did not show any fracture or dislocation.  Pelvic CT showed impacted fracture of the right femoral neck.  Orthopedics consulted, she underwent  right total arthroplasty on 9.22.22.  PT/OT recommended skilled nursing facility on discharge.  She is medically stable for discharge to SNF .  Following problems were addressed during her hospitalization:  Closed impacted right hip fracture/right femoral neck fracture: Secondary to mechanical fall.   Continue pain management, supportive care.  Orthopedics did  right total arthroplasty .   PT/OT recommended skilled nursing facility on discharge.  Follow-up with orthopedics in 2 weeks.   Hypertension: On Norvasc, Accupril and hydrochlorothiazide at home.  BP stable.  Hydrochlorothiazide will be discontinued on discharge  Leukocytosis:  Most likely reactive.  Resolved   Hyponatremia: Mild.  Check BMP in a week   GERD: On PPI   Recent history of UTI: She was recently started on treatment by her PCP with levofloxacin to 250 mg for 5 days.  Finished the course   Hyperlipidemia: On Crestor   Constipation: Continue bowel regimen.    Discharge Diagnoses:  Active Problems:   Closed right hip fracture  [facility])    Discharge Instructions  Discharge Instructions     Diet general   Complete by: As directed    Discharge instructions   Complete by: As directed    1)Please take prescribed medications as instructed 2)Follow up with orthopedics in 2 weeks.Name and number of the provider has been attached   Increase activity slowly   Complete by: As directed    No wound care   Complete by: As directed    Walker standard   Complete by: As directed       Allergies as of 11/12/2020       Reactions   Demerol Other (See Comments)   Passed out   Iohexol     Code: RASH, Desc: Pt states she had an IVP 15-62yrs ago and broke out in a rash all over her body.  She's never had IV contrast since., Onset Date: [id number]   Suprax [cefixime] Other (See Comments)   c diff   Latex Rash   Penicillins Rash        Medication List     TAKE these medications    amLODipine 10 MG tablet Commonly known as: NORVASC Take 10 mg by mouth at bedtime.   aspirin EC 81 MG tablet Take 1 tablet (81 mg total) by mouth 2 (two) times daily with a meal. What changed: when to take this   Besivance 0.6 % Susp Generic drug: Besifloxacin HCl Place 1 drop into both eyes in the morning, at noon, in the evening, and at bedtime. Starts 2 days after eye injection every 6 weeks   CALCIUM 600 PO Take 1 tablet by mouth 2 (two) times  daily.   Coenzyme Q-10 100 MG capsule Take 100 mg by mouth daily.   diclofenac Sodium 1 % Gel Commonly known as: Voltaren Apply 4 g topically 4 (four) times daily.   estradiol 0.1 MG/GM vaginal cream Commonly known as: ESTRACE Place 1 Applicatorful vaginally 3 (three) times a week. Helps prevent UTI   HYDROcodone-acetaminophen 5-325 MG tablet Commonly known as: NORCO/VICODIN Take 1-2 tablets by mouth every 4 (four) hours as needed for moderate pain (pain score 4-6).   lidocaine 5 % Commonly known as: Lidoderm Place 1 patch onto the skin daily. Remove & Discard patch within 12  hours or as directed by MD   multivitamin with minerals Tabs tablet Take 1 tablet by mouth daily.   Omega-3 1000 MG Caps Take 1 g by mouth daily.   omeprazole 20 MG tablet Commonly known as: PRILOSEC OTC Take 20 mg by mouth daily.   polyethylene glycol 17 g packet Commonly known as: MIRALAX / GLYCOLAX Take 17 g by mouth daily.   quinapril 40 MG tablet Commonly known as: ACCUPRIL Take 40 mg by mouth daily.   rosuvastatin 10 MG tablet Commonly known as: CRESTOR Take 10 mg by mouth every other day.   saccharomyces boulardii 250 MG capsule Commonly known as: FLORASTOR Take 1 capsule by mouth every other day.   senna 8.6 MG Tabs tablet Commonly known as: SENOKOT Take 1 tablet (8.6 mg total) by mouth 2 (two) times daily.        Follow-up Information     Paralee Cancel, MD. Schedule an appointment as soon as possible for a visit .   Specialty: Orthopedic Surgery Contact information: [address] [place] [address] [phone]         Rod Can, MD. Schedule an appointment as soon as possible for a visit in 2 week(s).   Specialty: Orthopedic Surgery Why: For wound re-check, For suture removal Contact information: [address] [place] [address] [phone]                Allergies  Allergen Reactions   Demerol Other (See Comments)    Passed out   Iohexol      Code: RASH, Desc: Pt states she had an IVP 15-10yrs ago and broke out in a rash all over her body.  She's never had IV contrast since., Onset Date: [id number]    Suprax [Cefixime] Other (See Comments)    c diff   Latex Rash   Penicillins Rash    Consultations: Orthopedics   Procedures/Studies: CT HEAD WO CONTRAST (5MM)  Result Date: 11/07/2020 CLINICAL DATA:  Facial trauma. EXAM: CT HEAD WITHOUT CONTRAST TECHNIQUE: Contiguous axial images were obtained from the base of the skull through the vertex without intravenous contrast. COMPARISON:  CT  head 10/15/2010. FINDINGS: Brain: No evidence of acute infarction, hemorrhage, hydrocephalus, extra-axial collection or mass lesion/mass effect. There is mild patchy periventricular and deep white matter hypodensity, likely chronic small vessel ischemic change. Vascular: Atherosclerotic calcifications are present within the cavernous internal carotid arteries. Skull: Normal. Negative for fracture or focal lesion. Sinuses/Orbits: There are postsurgical changes in both globes. The visualized paranasal sinuses and mastoid air cells are clear. Other: None. IMPRESSION: 1. No acute intracranial abnormality. 2. Mild chronic small vessel ischemic change. Electronically Signed   By: Ronney Asters M.D.   On: 11/07/2020 00:01   CT PELVIS WO CONTRAST  Result Date: 11/07/2020 CLINICAL DATA:  Pelvic trauma. EXAM: CT PELVIS WITHOUT CONTRAST TECHNIQUE: Multidetector CT  imaging of the pelvis was performed following the standard protocol without intravenous contrast. COMPARISON:  CT of the abdomen pelvis dated 09/21/2011. FINDINGS: Urinary Tract:  The urinary bladder is unremarkable. Bowel: Sigmoid diverticulosis without active inflammatory changes. No bowel dilatation or inflammation in the pelvis. Vascular/Lymphatic: Advanced aortoiliac atherosclerotic disease. Adenopathy. Reproductive: The uterus is grossly unremarkable. A pessary is noted. Other:  None Musculoskeletal: There is mildly impacted fracture of the right femoral neck. No other acute fracture. The bones are osteopenic. There is no dislocation. IMPRESSION: 1. Mildly impacted fracture of the right femoral neck. 2. Aortic Atherosclerosis (ICD10-I70.0). Electronically Signed   By: Anner Crete M.D.   On: 11/07/2020 02:30   Pelvis Portable  Result Date: 11/08/2020 CLINICAL DATA:  Status post right hip replacement EXAM: PORTABLE PELVIS 1 VIEWS COMPARISON:  Intraoperative films from earlier in the same day. FINDINGS: Right hip prosthesis is noted in satisfactory  position. Proximal cerclage wire is noted as well. No acute soft tissue abnormality is seen. IMPRESSION: Status post right hip replacement. Electronically Signed   By: Inez Catalina M.D.   On: 11/08/2020 18:57   DG CHEST PORT 1 VIEW  Result Date: 11/10/2020 CLINICAL DATA:  Hypoxia EXAM: PORTABLE CHEST 1 VIEW COMPARISON:  05/23/2020 FINDINGS: The heart size and mediastinal contours are within normal limits. Both lungs are clear. The visualized skeletal structures are unremarkable. Aortic atherosclerosis. IMPRESSION: No active disease. Electronically Signed   By: Donavan Foil M.D.   On: 11/10/2020 18:45   DG Knee Complete 4 Views Right  Result Date: 11/06/2020 CLINICAL DATA:  Status post fall. EXAM: RIGHT KNEE - COMPLETE 4+ VIEW COMPARISON:  None. FINDINGS: No evidence of acute fracture or dislocation. No evidence of arthropathy or other focal bone abnormality. A very small joint effusion is noted. IMPRESSION: 1. No acute fracture or dislocation. 2. Very small joint effusion. Electronically Signed   By: Virgina Norfolk M.D.   On: 11/06/2020 23:58   DG C-Arm 1-60 Min-No Report  Result Date: 11/08/2020 Fluoroscopy was utilized by the requesting physician.  No radiographic interpretation.   DG HIP OPERATIVE UNILAT W OR W/O PELVIS RIGHT  Result Date: 11/08/2020 CLINICAL DATA:  Total hip arthroplasty. EXAM: OPERATIVE RIGHT HIP (WITH PELVIS IF PERFORMED) TECHNIQUE: Fluoroscopic spot image(s) were submitted for interpretation post-operatively. COMPARISON:  Preoperative radiograph 11/06/2020 FINDINGS: Two fluoroscopic spot views of the pelvis and right hip obtained in the operating room. Right hip arthroplasty with cerclage wire fixation. Fluoroscopy time 14 seconds. IMPRESSION: Procedural fluoroscopy for right hip arthroplasty. Electronically Signed   By: Keith Rake M.D.   On: 11/08/2020 15:57   DG Hip Unilat W or Wo Pelvis 2-3 Views Right  Result Date: 11/06/2020 CLINICAL DATA:  Status post fall.  EXAM: DG HIP (WITH OR WITHOUT PELVIS) 2-3V RIGHT COMPARISON:  None. FINDINGS: There is no evidence of hip fracture or dislocation. Degenerative changes are seen in the form joint space narrowing and acetabular sclerosis. IMPRESSION: No acute osseous injury. Electronically Signed   By: Virgina Norfolk M.D.   On: 11/06/2020 23:57      Subjective: Patient seen and examined at the bedside this morning.  Hemodynamically stable.  Comfortable.  Denies any complaints today.  Eating her breakfast sitting in the chair  Discharge Exam: Vitals:   11/11/20 2232 11/12/20 0622  BP: 128/84 (!) 157/68  Pulse: 82 92  Resp: 16 17  Temp: 97.9 F (36.6 C) 98.2 F (36.8 C)  SpO2: 92% 95%   Vitals:   11/11/20 1303  11/11/20 2002 11/11/20 2232 11/12/20 0622  BP: (!) 123/53 139/63 128/84 (!) 157/68  Pulse: 88 90 82 92  Resp: 16 16 16 17   Temp: 97.9 F (36.6 C) 98.3 F (36.8 C) 97.9 F (36.6 C) 98.2 F (36.8 C)  TempSrc:  Oral Oral Oral  SpO2: 92% 94% 92% 95%  Weight:      Height:        General: Pt is alert, awake, not in acute distress Cardiovascular: RRR, S1/S2 +, no rubs, no gallops Respiratory: CTA bilaterally, no wheezing, no rhonchi Abdominal: Soft, NT, ND, bowel sounds + Extremities: no edema, no cyanosis, surgical wound on the right hip    The results of significant diagnostics from this hospitalization (including imaging, microbiology, ancillary and laboratory) are listed below for reference.     Microbiology: Recent Results (from the past 240 hour(s))  SARS CORONAVIRUS 2 (TAT 6-24 HRS) Nasopharyngeal Nasopharyngeal Swab     Status: None   Collection Time: 11/07/20  2:59 AM   Specimen: Nasopharyngeal Swab  Result Value Ref Range Status   SARS Coronavirus 2 NEGATIVE NEGATIVE Final    Comment: (NOTE) SARS-CoV-2 target nucleic acids are NOT DETECTED.  The SARS-CoV-2 RNA is generally detectable in upper and lower respiratory specimens during the acute phase of infection.  Negative results do not preclude SARS-CoV-2 infection, do not rule out co-infections with other pathogens, and should not be used as the sole basis for treatment or other patient management decisions. Negative results must be combined with clinical observations, patient history, and epidemiological information. The expected result is Negative.  Fact Sheet for Patients: SugarRoll.be  Fact Sheet for Healthcare Providers: https://www.woods-mathews.com/  This test is not yet approved or cleared by the [place] FDA and  has been authorized for detection and/or diagnosis of SARS-CoV-2 by FDA under an Emergency Use Authorization (EUA). This EUA will remain  in effect (meaning this test can be used) for the duration of the COVID-19 declaration under Se ction 564(b)(1) of the Act, 21 U.S.C. section 360bbb-3(b)(1), unless the authorization is terminated or revoked sooner.  Performed at [facility], [place] [address] [place], [place] [address]   MRSA Next Gen by PCR, Nasal     Status: None   Collection Time: 11/08/20  5:40 AM   Specimen: Nasal Mucosa; Nasal Swab  Result Value Ref Range Status   MRSA by PCR Next Gen NOT DETECTED NOT DETECTED Final    Comment: (NOTE) The GeneXpert MRSA Assay (FDA approved for NASAL specimens only), is one component of a comprehensive MRSA colonization surveillance program. It is not intended to diagnose MRSA infection nor to guide or monitor treatment for MRSA infections. Test performance is not FDA approved in patients less than 46 years old. Performed at [facility], [place] [address] [place], [place] [address]   Resp Panel by RT-PCR (Flu A&B, Covid) Nasopharyngeal Swab     Status: None   Collection Time: 11/11/20  4:55 PM   Specimen: Nasopharyngeal Swab; Nasopharyngeal(NP) swabs in vial transport medium  Result Value Ref Range Status   SARS Coronavirus 2 by RT PCR NEGATIVE NEGATIVE  Final    Comment: (NOTE) SARS-CoV-2 target nucleic acids are NOT DETECTED.  The SARS-CoV-2 RNA is generally detectable in upper respiratory specimens during the acute phase of infection. The lowest concentration of SARS-CoV-2 viral copies this assay can detect is 138 copies/mL. A negative result does not preclude SARS-Cov-2 infection and should not be used as the sole basis for  treatment or other patient management decisions. A negative result may occur with  improper specimen collection/handling, submission of specimen other than nasopharyngeal swab, presence of viral mutation(s) within the areas targeted by this assay, and inadequate number of viral copies(<138 copies/mL). A negative result must be combined with clinical observations, patient history, and epidemiological information. The expected result is Negative.  Fact Sheet for Patients:  EntrepreneurPulse.com.au  Fact Sheet for Healthcare Providers:  IncredibleEmployment.be  This test is no t yet approved or cleared by the [place] FDA and  has been authorized for detection and/or diagnosis of SARS-CoV-2 by FDA under an Emergency Use Authorization (EUA). This EUA will remain  in effect (meaning this test can be used) for the duration of the COVID-19 declaration under Section 564(b)(1) of the Act, 21 U.S.C.section 360bbb-3(b)(1), unless the authorization is terminated  or revoked sooner.       Influenza A by PCR NEGATIVE NEGATIVE Final   Influenza B by PCR NEGATIVE NEGATIVE Final    Comment: (NOTE) The Xpert Xpress SARS-CoV-2/FLU/RSV plus assay is intended as an aid in the diagnosis of influenza from Nasopharyngeal swab specimens and should not be used as a sole basis for treatment. Nasal washings and aspirates are unacceptable for Xpert Xpress SARS-CoV-2/FLU/RSV testing.  Fact Sheet for Patients: EntrepreneurPulse.com.au  Fact Sheet for Healthcare  Providers: IncredibleEmployment.be  This test is not yet approved or cleared by the [place] FDA and has been authorized for detection and/or diagnosis of SARS-CoV-2 by FDA under an Emergency Use Authorization (EUA). This EUA will remain in effect (meaning this test can be used) for the duration of the COVID-19 declaration under Section 564(b)(1) of the Act, 21 U.S.C. section 360bbb-3(b)(1), unless the authorization is terminated or revoked.  Performed at [facility], [place] [address] [place], [place] [address]      Labs: BNP (last 3 results) No results for input(s): BNP in the last 8760 hours. Basic Metabolic Panel: Recent Labs  Lab 11/07/20 0259 11/08/20 0336 11/09/20 0326 11/10/20 0329 11/12/20 0305  NA 130* 125* 129* 129* 136  K 3.4* 4.5 3.9 3.5 4.3  CL 94* 97* 99 94* 101  CO2 24 22 23 25 27   GLUCOSE 132* 140* 120* 126* 111*  BUN 11 7* 9 6* 9  CREATININE 0.48 0.47 0.49 0.46 0.52  CALCIUM 9.7 8.9 8.6* 9.0 9.4   Liver Function Tests: No results for input(s): AST, ALT, ALKPHOS, BILITOT, PROT, ALBUMIN in the last 168 hours. No results for input(s): LIPASE, AMYLASE in the last 168 hours. No results for input(s): AMMONIA in the last 168 hours. CBC: Recent Labs  Lab 11/07/20 0259 11/08/20 0336 11/09/20 0326 11/10/20 0329 11/11/20 0258  WBC 16.2* 15.4* 8.3 8.7 8.6  NEUTROABS  --  13.8* 6.6  --   --   HGB 14.4 12.1 10.0* 10.2* 9.4*  HCT 42.8 35.9* 30.2* 30.8* 27.4*  MCV 86.1 85.7 86.5 86.3 84.0  PLT 271 198 153 179 222   Cardiac Enzymes: No results for input(s): CKTOTAL, CKMB, CKMBINDEX, TROPONINI in the last 168 hours. BNP: Invalid input(s): POCBNP CBG: No results for input(s): GLUCAP in the last 168 hours. D-Dimer No results for input(s): DDIMER in the last 72 hours. Hgb A1c No results for input(s): HGBA1C in the last 72 hours. Lipid Profile No results for input(s): CHOL, HDL, LDLCALC, TRIG, CHOLHDL, LDLDIRECT in  the last 72 hours. Thyroid function studies No results for input(s): TSH, T4TOTAL, T3FREE, THYROIDAB in the last 72 hours.  Invalid  input(s): FREET3  Anemia work up No results for input(s): VITAMINB12, FOLATE, FERRITIN, TIBC, IRON, RETICCTPCT in the last 72 hours. Urinalysis    Component Value Date/Time   COLORURINE COLORLESS (A) 07/24/2016 1050   APPEARANCEUR CLEAR 07/24/2016 1050   LABSPEC 1.005 07/24/2016 1050   PHURINE 8.0 07/24/2016 1050   GLUCOSEU NEGATIVE 07/24/2016 1050   HGBUR NEGATIVE 07/24/2016 1050   BILIRUBINUR NEGATIVE 07/24/2016 1050   KETONESUR NEGATIVE 07/24/2016 1050   PROTEINUR NEGATIVE 07/24/2016 1050   UROBILINOGEN 0.2 09/21/2011 0832   NITRITE NEGATIVE 07/24/2016 1050   LEUKOCYTESUR SMALL (A) 07/24/2016 1050   Sepsis Labs Invalid input(s): PROCALCITONIN,  WBC,  LACTICIDVEN Microbiology Recent Results (from the past 240 hour(s))  SARS CORONAVIRUS 2 (TAT 6-24 HRS) Nasopharyngeal Nasopharyngeal Swab     Status: None   Collection Time: 11/07/20  2:59 AM   Specimen: Nasopharyngeal Swab  Result Value Ref Range Status   SARS Coronavirus 2 NEGATIVE NEGATIVE Final    Comment: (NOTE) SARS-CoV-2 target nucleic acids are NOT DETECTED.  The SARS-CoV-2 RNA is generally detectable in upper and lower respiratory specimens during the acute phase of infection. Negative results do not preclude SARS-CoV-2 infection, do not rule out co-infections with other pathogens, and should not be used as the sole basis for treatment or other patient management decisions. Negative results must be combined with clinical observations, patient history, and epidemiological information. The expected result is Negative.  Fact Sheet for Patients: SugarRoll.be  Fact Sheet for Healthcare Providers: https://www.woods-mathews.com/  This test is not yet approved or cleared by the [place] FDA and  has been authorized for detection and/or  diagnosis of SARS-CoV-2 by FDA under an Emergency Use Authorization (EUA). This EUA will remain  in effect (meaning this test can be used) for the duration of the COVID-19 declaration under Se ction 564(b)(1) of the Act, 21 U.S.C. section 360bbb-3(b)(1), unless the authorization is terminated or revoked sooner.  Performed at [facility], [place] [address], [place], [place] [address]   MRSA Next Gen by PCR, Nasal     Status: None   Collection Time: 11/08/20  5:40 AM   Specimen: Nasal Mucosa; Nasal Swab  Result Value Ref Range Status   MRSA by PCR Next Gen NOT DETECTED NOT DETECTED Final    Comment: (NOTE) The GeneXpert MRSA Assay (FDA approved for NASAL specimens only), is one component of a comprehensive MRSA colonization surveillance program. It is not intended to diagnose MRSA infection nor to guide or monitor treatment for MRSA infections. Test performance is not FDA approved in patients less than 37 years old. Performed at [facility], [place] [address] [place], [place] [address]   Resp Panel by RT-PCR (Flu A&B, Covid) Nasopharyngeal Swab     Status: None   Collection Time: 11/11/20  4:55 PM   Specimen: Nasopharyngeal Swab; Nasopharyngeal(NP) swabs in vial transport medium  Result Value Ref Range Status   SARS Coronavirus 2 by RT PCR NEGATIVE NEGATIVE Final    Comment: (NOTE) SARS-CoV-2 target nucleic acids are NOT DETECTED.  The SARS-CoV-2 RNA is generally detectable in upper respiratory specimens during the acute phase of infection. The lowest concentration of SARS-CoV-2 viral copies this assay can detect is 138 copies/mL. A negative result does not preclude SARS-Cov-2 infection and should not be used as the sole basis for treatment or other patient management decisions. A negative result may occur with  improper specimen collection/handling, submission of specimen other than nasopharyngeal swab, presence of viral mutation(s)  within the areas  targeted by this assay, and inadequate number of viral copies(<138 copies/mL). A negative result must be combined with clinical observations, patient history, and epidemiological information. The expected result is Negative.  Fact Sheet for Patients:  EntrepreneurPulse.com.au  Fact Sheet for Healthcare Providers:  IncredibleEmployment.be  This test is no t yet approved or cleared by the [place] FDA and  has been authorized for detection and/or diagnosis of SARS-CoV-2 by FDA under an Emergency Use Authorization (EUA). This EUA will remain  in effect (meaning this test can be used) for the duration of the COVID-19 declaration under Section 564(b)(1) of the Act, 21 U.S.C.section 360bbb-3(b)(1), unless the authorization is terminated  or revoked sooner.       Influenza A by PCR NEGATIVE NEGATIVE Final   Influenza B by PCR NEGATIVE NEGATIVE Final    Comment: (NOTE) The Xpert Xpress SARS-CoV-2/FLU/RSV plus assay is intended as an aid in the diagnosis of influenza from Nasopharyngeal swab specimens and should not be used as a sole basis for treatment. Nasal washings and aspirates are unacceptable for Xpert Xpress SARS-CoV-2/FLU/RSV testing.  Fact Sheet for Patients: EntrepreneurPulse.com.au  Fact Sheet for Healthcare Providers: IncredibleEmployment.be  This test is not yet approved or cleared by the [place] FDA and has been authorized for detection and/or diagnosis of SARS-CoV-2 by FDA under an Emergency Use Authorization (EUA). This EUA will remain in effect (meaning this test can be used) for the duration of the COVID-19 declaration under Section 564(b)(1) of the Act, 21 U.S.C. section 360bbb-3(b)(1), unless the authorization is terminated or revoked.  Performed at [facility], [place] [address], [place], [place] [address]     Please note: You were cared for by a hospitalist  during your hospital stay. Once you are discharged, your primary care physician will handle any further medical issues. Please note that NO REFILLS for any discharge medications will be authorized once you are discharged, as it is imperative that you return to your primary care physician (or establish a relationship with a primary care physician if you do not have one) for your post hospital discharge needs so that they can reassess your need for medications and monitor your lab values.    Time coordinating discharge: 40 minutes  SIGNED:   Shelly Coss, MD  [facility] 11/12/2020, 8:07 AM Pager [phone]  If 7PM-7AM, please contact night-coverage [email or web address] Password [email or web address]

## 2020-11-11 NOTE — Plan of Care (Signed)
  Problem: Education: Goal: Knowledge of General Education information will improve Description: Including pain rating scale, medication(s)/side effects and non-pharmacologic comfort measures Outcome: Progressing   Problem: Pain Managment: Goal: General experience of comfort will improve Outcome: Progressing   Problem: Safety: Goal: Ability to remain free from injury will improve Outcome: Progressing   

## 2020-11-11 NOTE — Progress Notes (Signed)
SLP Cancellation Note  Patient Details Name: [name] [name] MRN: [id number] DOB: [birth year]   Cancelled treatment:       Reason Eval/Treat Not Completed: SLP screened, no needs identified, will sign off. SLP spoke with patient who denies any swallowing difficulties and that the coughing up of mucous seems to have been an isolated incident. SLP to s/o at this time. Thank you for this consult!  Sonia Baller, MA, CCC-SLP Speech Therapy

## 2020-11-11 NOTE — Progress Notes (Signed)
Subjective: 3 Days Post-Op Procedure(s) (LRB): TOTAL HIP ARTHROPLASTY ANTERIOR APPROACH (Right) Patient reports pain as 3 on 0-10 scale.   Denies CP or SOB.  Voiding without difficulty. Positive flatus. Objective: Vital signs in last 24 hours: Temp:  [97.9 F (36.6 C)-98.9 F (37.2 C)] 97.9 F (36.6 C) (09/25 0535) Pulse Rate:  [87-95] 95 (09/25 0535) Resp:  [16] 16 (09/25 0535) BP: (133-153)/(59-86) 144/65 (09/25 0535) SpO2:  [92 %-98 %] 92 % (09/25 0535)  Intake/Output from previous day: 09/24 0701 - 09/25 0700 In: 600 [P.O.:600] Out: -  Intake/Output this shift: No intake/output data recorded.  Recent Labs    11/09/20 0326 11/10/20 0329 11/11/20 0258  HGB 10.0* 10.2* 9.4*   Recent Labs    11/10/20 0329 11/11/20 0258  WBC 8.7 8.6  RBC 3.57* 3.26*  HCT 30.8* 27.4*  PLT 179 222   Recent Labs    11/09/20 0326 11/10/20 0329  NA 129* 129*  K 3.9 3.5  CL 99 94*  CO2 23 25  BUN 9 6*  CREATININE 0.49 0.46  GLUCOSE 120* 126*  CALCIUM 8.6* 9.0   No results for input(s): LABPT, INR in the last 72 hours.  Neurologically intact ABD soft Incision: dressing C/D/I Compartment soft  Assessment/Plan:  3 Days Post-Op Procedure(s) (LRB): TOTAL HIP ARTHROPLASTY ANTERIOR APPROACH (Right) Na 129 no sx Advance diet Up with therapy D/C IV fluids Discharge to SNF tomorrow   Active Problems:   Closed right hip fracture ([place])      Doroteo Bradford Rhylan Kagel 11/11/2020, @NOW 

## 2020-11-11 NOTE — TOC Transition Note (Signed)
Transition of Care [facility]) - CM/SW Discharge Note   Patient Details  Name: SANNA PORCARO MRN: [id number] Date of Birth: 05-08-1929  Transition of Care [facility]) CM/SW Contact:  Jaelynn Pozo, Marta Lamas, LCSW Phone Number: 11/11/2020, 11:25 AM   Clinical Narrative:     Patient is scheduled for discharge to [facility], to receive short-term rehabilitative services, on Monday (11/12/2020).  LCSW placed request for Attending Physician, Dr. Shelly Coss to order a Rapid COVID Screening.  Final next level of care: Skilled Nursing Facility Barriers to Discharge: Continued Medical Work up   Patient Goals and CMS Choice Patient states their goals for this hospitalization and ongoing recovery are:: Return home CMS Medicare.gov Compare Post Acute Care list provided to:: Patient Represenative (must comment) Choice offered to / list presented to : Patient, Adult Children  Discharge Placement   [place].    Discharge Plan and Services In-house Referral: Clinical Social Work   Post Acute Care Choice: [place]          DME Arranged: N/A DME Agency: NA    Social Determinants of Health (SDOH) Interventions   N/A  Readmission Risk Interventions No flowsheet data found.  Nat Christen, BSW, MSW, [employer]  Licensed Holiday representative  [employer]  Mailing Address-[address], [place], [place] [address] Physical Address-[address], [place], [place] [address] Toll Free Main # [phone] Fax # [phone] Cell # [phone]  [email or web address]

## 2020-11-12 DIAGNOSIS — W19XXXA Unspecified fall, initial encounter: Secondary | ICD-10-CM | POA: Diagnosis not present

## 2020-11-12 DIAGNOSIS — Z87440 Personal history of urinary (tract) infections: Secondary | ICD-10-CM | POA: Diagnosis not present

## 2020-11-12 DIAGNOSIS — Z792 Long term (current) use of antibiotics: Secondary | ICD-10-CM | POA: Diagnosis not present

## 2020-11-12 DIAGNOSIS — K5900 Constipation, unspecified: Secondary | ICD-10-CM | POA: Diagnosis not present

## 2020-11-12 DIAGNOSIS — J3489 Other specified disorders of nose and nasal sinuses: Secondary | ICD-10-CM | POA: Diagnosis not present

## 2020-11-12 DIAGNOSIS — S72001A Fracture of unspecified part of neck of right femur, initial encounter for closed fracture: Secondary | ICD-10-CM | POA: Diagnosis not present

## 2020-11-12 DIAGNOSIS — R531 Weakness: Secondary | ICD-10-CM | POA: Diagnosis not present

## 2020-11-12 DIAGNOSIS — E782 Mixed hyperlipidemia: Secondary | ICD-10-CM | POA: Diagnosis not present

## 2020-11-12 DIAGNOSIS — R5381 Other malaise: Secondary | ICD-10-CM | POA: Diagnosis not present

## 2020-11-12 DIAGNOSIS — R21 Rash and other nonspecific skin eruption: Secondary | ICD-10-CM | POA: Diagnosis not present

## 2020-11-12 DIAGNOSIS — K5903 Drug induced constipation: Secondary | ICD-10-CM | POA: Diagnosis not present

## 2020-11-12 DIAGNOSIS — I209 Angina pectoris, unspecified: Secondary | ICD-10-CM | POA: Diagnosis not present

## 2020-11-12 DIAGNOSIS — R079 Chest pain, unspecified: Secondary | ICD-10-CM | POA: Diagnosis not present

## 2020-11-12 DIAGNOSIS — E569 Vitamin deficiency, unspecified: Secondary | ICD-10-CM | POA: Diagnosis not present

## 2020-11-12 DIAGNOSIS — S72031D Displaced midcervical fracture of right femur, subsequent encounter for closed fracture with routine healing: Secondary | ICD-10-CM | POA: Diagnosis not present

## 2020-11-12 DIAGNOSIS — Z7401 Bed confinement status: Secondary | ICD-10-CM | POA: Diagnosis not present

## 2020-11-12 DIAGNOSIS — K219 Gastro-esophageal reflux disease without esophagitis: Secondary | ICD-10-CM | POA: Diagnosis not present

## 2020-11-12 DIAGNOSIS — R52 Pain, unspecified: Secondary | ICD-10-CM | POA: Diagnosis not present

## 2020-11-12 DIAGNOSIS — N390 Urinary tract infection, site not specified: Secondary | ICD-10-CM | POA: Diagnosis not present

## 2020-11-12 DIAGNOSIS — Z4689 Encounter for fitting and adjustment of other specified devices: Secondary | ICD-10-CM | POA: Diagnosis not present

## 2020-11-12 DIAGNOSIS — L282 Other prurigo: Secondary | ICD-10-CM | POA: Diagnosis not present

## 2020-11-12 DIAGNOSIS — I10 Essential (primary) hypertension: Secondary | ICD-10-CM | POA: Diagnosis not present

## 2020-11-12 DIAGNOSIS — S72001D Fracture of unspecified part of neck of right femur, subsequent encounter for closed fracture with routine healing: Secondary | ICD-10-CM | POA: Diagnosis not present

## 2020-11-12 DIAGNOSIS — E785 Hyperlipidemia, unspecified: Secondary | ICD-10-CM | POA: Diagnosis not present

## 2020-11-12 LAB — BASIC METABOLIC PANEL
Anion gap: 8 (ref 5–15)
BUN: 9 mg/dL (ref 8–23)
CO2: 27 mmol/L (ref 22–32)
Calcium: 9.4 mg/dL (ref 8.9–10.3)
Chloride: 101 mmol/L (ref 98–111)
Creatinine, Ser: 0.52 mg/dL (ref 0.44–1.00)
GFR, Estimated: >60 mL/min (ref >60)
Glucose, Bld: 111 mg/dL — ABNORMAL HIGH (ref 70–99)
Potassium: 4.3 mmol/L (ref 3.5–5.1)
Sodium: 136 mmol/L (ref 135–145)

## 2020-11-12 NOTE — Progress Notes (Signed)
Patient seen and examined at bedside this morning.  Hemodynamically stable.  She is medically stable for discharge to skilled nursing facility place. No change in the medical management.  Discharge summary and orders are in place

## 2020-11-12 NOTE — Progress Notes (Signed)
Patient discharged to [facility] via [facility]. All belongings w/ patient. Report called to [employer].

## 2020-11-12 NOTE — TOC Transition Note (Signed)
Transition of Care [facility]) - CM/SW Discharge Note  Patient Details  Name: TYNETTA BACHMANN MRN: [id number] Date of Birth: 10-31-1929  Transition of Care [facility]) CM/SW Contact:  Sherie Don, LCSW Phone Number: 11/12/2020, 9:33 AM  Clinical Narrative: Patient is medically stable for discharge to [facility]. COVID test is negative. Discharge summary, discharge orders, and SNF transfer report faxed to facility in hub. Patient will go to room 608 and the number for report is [phone].  Medical necessity form done; PTAR scheduled. Discharge packet complete. Daughter notified of discharge. CSW updated patient and RN. TOC signing off.  Final next level of care: Skilled Nursing Facility Barriers to Discharge: Barriers Resolved  Patient Goals and CMS Choice Patient states their goals for this hospitalization and ongoing recovery are:: Return home CMS Medicare.gov Compare Post Acute Care list provided to:: Patient Represenative (must comment) Choice offered to / list presented to : Patient, Adult Children  Discharge Placement PASRR number recieved: 11/12/20 Patient chooses bed at: [facility] Patient to be transferred to facility by: [facility] Name of family member notified: Tretha Sciara (daughter) Patient and family notified of of transfer: 11/12/20  Discharge Plan and Services In-house Referral: Clinical Social Work Post Acute Care Choice: [place]          DME Arranged: N/A DME Agency: NA  Readmission Risk Interventions No flowsheet data found.

## 2020-11-13 DIAGNOSIS — E782 Mixed hyperlipidemia: Secondary | ICD-10-CM | POA: Diagnosis not present

## 2020-11-13 DIAGNOSIS — S72001D Fracture of unspecified part of neck of right femur, subsequent encounter for closed fracture with routine healing: Secondary | ICD-10-CM | POA: Diagnosis not present

## 2020-11-13 DIAGNOSIS — I10 Essential (primary) hypertension: Secondary | ICD-10-CM | POA: Diagnosis not present

## 2020-11-14 DIAGNOSIS — K5903 Drug induced constipation: Secondary | ICD-10-CM | POA: Diagnosis not present

## 2020-11-14 DIAGNOSIS — J3489 Other specified disorders of nose and nasal sinuses: Secondary | ICD-10-CM | POA: Diagnosis not present

## 2020-11-14 DIAGNOSIS — L282 Other prurigo: Secondary | ICD-10-CM | POA: Diagnosis not present

## 2020-11-14 DIAGNOSIS — S72001D Fracture of unspecified part of neck of right femur, subsequent encounter for closed fracture with routine healing: Secondary | ICD-10-CM | POA: Diagnosis not present

## 2020-11-14 DIAGNOSIS — R21 Rash and other nonspecific skin eruption: Secondary | ICD-10-CM | POA: Diagnosis not present

## 2020-11-19 DIAGNOSIS — Z4689 Encounter for fitting and adjustment of other specified devices: Secondary | ICD-10-CM | POA: Diagnosis not present

## 2020-11-19 DIAGNOSIS — L282 Other prurigo: Secondary | ICD-10-CM | POA: Diagnosis not present

## 2020-11-19 DIAGNOSIS — Z87440 Personal history of urinary (tract) infections: Secondary | ICD-10-CM | POA: Diagnosis not present

## 2020-11-19 DIAGNOSIS — S72001D Fracture of unspecified part of neck of right femur, subsequent encounter for closed fracture with routine healing: Secondary | ICD-10-CM | POA: Diagnosis not present

## 2020-11-26 DIAGNOSIS — S72031D Displaced midcervical fracture of right femur, subsequent encounter for closed fracture with routine healing: Secondary | ICD-10-CM | POA: Diagnosis not present

## 2020-11-29 DIAGNOSIS — S72001D Fracture of unspecified part of neck of right femur, subsequent encounter for closed fracture with routine healing: Secondary | ICD-10-CM | POA: Diagnosis not present

## 2020-11-30 DIAGNOSIS — S72001D Fracture of unspecified part of neck of right femur, subsequent encounter for closed fracture with routine healing: Secondary | ICD-10-CM | POA: Diagnosis not present

## 2020-12-03 DIAGNOSIS — S72001D Fracture of unspecified part of neck of right femur, subsequent encounter for closed fracture with routine healing: Secondary | ICD-10-CM | POA: Diagnosis not present

## 2020-12-04 DIAGNOSIS — S72001D Fracture of unspecified part of neck of right femur, subsequent encounter for closed fracture with routine healing: Secondary | ICD-10-CM | POA: Diagnosis not present

## 2020-12-06 DIAGNOSIS — Z23 Encounter for immunization: Secondary | ICD-10-CM | POA: Diagnosis not present

## 2020-12-07 DIAGNOSIS — S72001D Fracture of unspecified part of neck of right femur, subsequent encounter for closed fracture with routine healing: Secondary | ICD-10-CM | POA: Diagnosis not present

## 2020-12-11 DIAGNOSIS — S72001D Fracture of unspecified part of neck of right femur, subsequent encounter for closed fracture with routine healing: Secondary | ICD-10-CM | POA: Diagnosis not present

## 2020-12-12 DIAGNOSIS — S72001D Fracture of unspecified part of neck of right femur, subsequent encounter for closed fracture with routine healing: Secondary | ICD-10-CM | POA: Diagnosis not present

## 2020-12-14 DIAGNOSIS — S72001D Fracture of unspecified part of neck of right femur, subsequent encounter for closed fracture with routine healing: Secondary | ICD-10-CM | POA: Diagnosis not present

## 2020-12-17 DIAGNOSIS — S72001D Fracture of unspecified part of neck of right femur, subsequent encounter for closed fracture with routine healing: Secondary | ICD-10-CM | POA: Diagnosis not present

## 2020-12-19 DIAGNOSIS — S72001D Fracture of unspecified part of neck of right femur, subsequent encounter for closed fracture with routine healing: Secondary | ICD-10-CM | POA: Diagnosis not present

## 2020-12-21 DIAGNOSIS — S72001D Fracture of unspecified part of neck of right femur, subsequent encounter for closed fracture with routine healing: Secondary | ICD-10-CM | POA: Diagnosis not present

## 2020-12-24 ENCOUNTER — Ambulatory Visit: Payer: Self-pay | Admitting: Student

## 2020-12-24 DIAGNOSIS — M1711 Unilateral primary osteoarthritis, right knee: Secondary | ICD-10-CM | POA: Diagnosis not present

## 2020-12-24 DIAGNOSIS — S72031D Displaced midcervical fracture of right femur, subsequent encounter for closed fracture with routine healing: Secondary | ICD-10-CM | POA: Diagnosis not present

## 2020-12-25 DIAGNOSIS — S72001D Fracture of unspecified part of neck of right femur, subsequent encounter for closed fracture with routine healing: Secondary | ICD-10-CM | POA: Diagnosis not present

## 2020-12-26 ENCOUNTER — Other Ambulatory Visit: Payer: Self-pay

## 2020-12-26 ENCOUNTER — Encounter (INDEPENDENT_AMBULATORY_CARE_PROVIDER_SITE_OTHER): Payer: Medicare Other | Admitting: Ophthalmology

## 2020-12-26 DIAGNOSIS — H35033 Hypertensive retinopathy, bilateral: Secondary | ICD-10-CM | POA: Diagnosis not present

## 2020-12-26 DIAGNOSIS — I10 Essential (primary) hypertension: Secondary | ICD-10-CM

## 2020-12-26 DIAGNOSIS — H353231 Exudative age-related macular degeneration, bilateral, with active choroidal neovascularization: Secondary | ICD-10-CM

## 2020-12-26 DIAGNOSIS — H43813 Vitreous degeneration, bilateral: Secondary | ICD-10-CM

## 2020-12-31 DIAGNOSIS — N8110 Cystocele, unspecified: Secondary | ICD-10-CM | POA: Diagnosis not present

## 2020-12-31 DIAGNOSIS — N814 Uterovaginal prolapse, unspecified: Secondary | ICD-10-CM | POA: Diagnosis not present

## 2020-12-31 DIAGNOSIS — Z4689 Encounter for fitting and adjustment of other specified devices: Secondary | ICD-10-CM | POA: Diagnosis not present

## 2020-12-31 DIAGNOSIS — N952 Postmenopausal atrophic vaginitis: Secondary | ICD-10-CM | POA: Diagnosis not present

## 2021-01-29 DIAGNOSIS — R300 Dysuria: Secondary | ICD-10-CM | POA: Diagnosis not present

## 2021-01-30 DIAGNOSIS — U071 COVID-19: Secondary | ICD-10-CM | POA: Diagnosis not present

## 2021-01-30 DIAGNOSIS — R519 Headache, unspecified: Secondary | ICD-10-CM | POA: Diagnosis not present

## 2021-01-30 DIAGNOSIS — E785 Hyperlipidemia, unspecified: Secondary | ICD-10-CM | POA: Diagnosis not present

## 2021-02-19 DIAGNOSIS — R300 Dysuria: Secondary | ICD-10-CM | POA: Diagnosis not present

## 2021-02-19 DIAGNOSIS — I10 Essential (primary) hypertension: Secondary | ICD-10-CM | POA: Diagnosis not present

## 2021-02-19 DIAGNOSIS — Z79899 Other long term (current) drug therapy: Secondary | ICD-10-CM | POA: Diagnosis not present

## 2021-02-26 DIAGNOSIS — Z4689 Encounter for fitting and adjustment of other specified devices: Secondary | ICD-10-CM | POA: Diagnosis not present

## 2021-02-26 DIAGNOSIS — N390 Urinary tract infection, site not specified: Secondary | ICD-10-CM | POA: Diagnosis not present

## 2021-03-05 DIAGNOSIS — M25552 Pain in left hip: Secondary | ICD-10-CM | POA: Diagnosis not present

## 2021-03-05 DIAGNOSIS — S72031D Displaced midcervical fracture of right femur, subsequent encounter for closed fracture with routine healing: Secondary | ICD-10-CM | POA: Diagnosis not present

## 2021-03-07 DIAGNOSIS — D225 Melanocytic nevi of trunk: Secondary | ICD-10-CM | POA: Diagnosis not present

## 2021-03-07 DIAGNOSIS — L814 Other melanin hyperpigmentation: Secondary | ICD-10-CM | POA: Diagnosis not present

## 2021-03-07 DIAGNOSIS — L820 Inflamed seborrheic keratosis: Secondary | ICD-10-CM | POA: Diagnosis not present

## 2021-03-07 DIAGNOSIS — D485 Neoplasm of uncertain behavior of skin: Secondary | ICD-10-CM | POA: Diagnosis not present

## 2021-03-07 DIAGNOSIS — L821 Other seborrheic keratosis: Secondary | ICD-10-CM | POA: Diagnosis not present

## 2021-03-07 DIAGNOSIS — Z85828 Personal history of other malignant neoplasm of skin: Secondary | ICD-10-CM | POA: Diagnosis not present

## 2021-03-07 DIAGNOSIS — L570 Actinic keratosis: Secondary | ICD-10-CM | POA: Diagnosis not present

## 2021-03-07 DIAGNOSIS — D1801 Hemangioma of skin and subcutaneous tissue: Secondary | ICD-10-CM | POA: Diagnosis not present

## 2021-03-07 DIAGNOSIS — C44729 Squamous cell carcinoma of skin of left lower limb, including hip: Secondary | ICD-10-CM | POA: Diagnosis not present

## 2021-03-12 DIAGNOSIS — R3989 Other symptoms and signs involving the genitourinary system: Secondary | ICD-10-CM | POA: Diagnosis not present

## 2021-03-12 DIAGNOSIS — N390 Urinary tract infection, site not specified: Secondary | ICD-10-CM | POA: Diagnosis not present

## 2021-03-12 DIAGNOSIS — Z4689 Encounter for fitting and adjustment of other specified devices: Secondary | ICD-10-CM | POA: Diagnosis not present

## 2021-03-20 ENCOUNTER — Other Ambulatory Visit: Payer: Self-pay

## 2021-03-20 ENCOUNTER — Encounter (INDEPENDENT_AMBULATORY_CARE_PROVIDER_SITE_OTHER): Payer: Medicare Other | Admitting: Ophthalmology

## 2021-03-20 DIAGNOSIS — H35033 Hypertensive retinopathy, bilateral: Secondary | ICD-10-CM

## 2021-03-20 DIAGNOSIS — H353231 Exudative age-related macular degeneration, bilateral, with active choroidal neovascularization: Secondary | ICD-10-CM | POA: Diagnosis not present

## 2021-03-20 DIAGNOSIS — H43813 Vitreous degeneration, bilateral: Secondary | ICD-10-CM | POA: Diagnosis not present

## 2021-03-20 DIAGNOSIS — I10 Essential (primary) hypertension: Secondary | ICD-10-CM

## 2021-04-23 DIAGNOSIS — R350 Frequency of micturition: Secondary | ICD-10-CM | POA: Diagnosis not present

## 2021-04-30 DIAGNOSIS — Z4689 Encounter for fitting and adjustment of other specified devices: Secondary | ICD-10-CM | POA: Diagnosis not present

## 2021-04-30 DIAGNOSIS — N952 Postmenopausal atrophic vaginitis: Secondary | ICD-10-CM | POA: Diagnosis not present

## 2021-06-13 ENCOUNTER — Encounter (INDEPENDENT_AMBULATORY_CARE_PROVIDER_SITE_OTHER): Payer: Medicare Other | Admitting: Ophthalmology

## 2021-06-13 DIAGNOSIS — H43813 Vitreous degeneration, bilateral: Secondary | ICD-10-CM | POA: Diagnosis not present

## 2021-06-13 DIAGNOSIS — I10 Essential (primary) hypertension: Secondary | ICD-10-CM | POA: Diagnosis not present

## 2021-06-13 DIAGNOSIS — H35033 Hypertensive retinopathy, bilateral: Secondary | ICD-10-CM

## 2021-06-13 DIAGNOSIS — H353231 Exudative age-related macular degeneration, bilateral, with active choroidal neovascularization: Secondary | ICD-10-CM | POA: Diagnosis not present

## 2021-06-18 DIAGNOSIS — H353231 Exudative age-related macular degeneration, bilateral, with active choroidal neovascularization: Secondary | ICD-10-CM | POA: Diagnosis not present

## 2021-06-18 DIAGNOSIS — K219 Gastro-esophageal reflux disease without esophagitis: Secondary | ICD-10-CM | POA: Diagnosis not present

## 2021-06-18 DIAGNOSIS — Z1389 Encounter for screening for other disorder: Secondary | ICD-10-CM | POA: Diagnosis not present

## 2021-06-18 DIAGNOSIS — Z0000 Encounter for general adult medical examination without abnormal findings: Secondary | ICD-10-CM | POA: Diagnosis not present

## 2021-06-18 DIAGNOSIS — E7800 Pure hypercholesterolemia, unspecified: Secondary | ICD-10-CM | POA: Diagnosis not present

## 2021-06-18 DIAGNOSIS — I10 Essential (primary) hypertension: Secondary | ICD-10-CM | POA: Diagnosis not present

## 2021-06-18 DIAGNOSIS — K9089 Other intestinal malabsorption: Secondary | ICD-10-CM | POA: Diagnosis not present

## 2021-06-18 DIAGNOSIS — Z79899 Other long term (current) drug therapy: Secondary | ICD-10-CM | POA: Diagnosis not present

## 2021-06-18 DIAGNOSIS — I700 Atherosclerosis of aorta: Secondary | ICD-10-CM | POA: Diagnosis not present

## 2021-06-28 DIAGNOSIS — R300 Dysuria: Secondary | ICD-10-CM | POA: Diagnosis not present

## 2021-07-01 ENCOUNTER — Other Ambulatory Visit: Payer: Self-pay | Admitting: Geriatric Medicine

## 2021-07-01 DIAGNOSIS — N390 Urinary tract infection, site not specified: Secondary | ICD-10-CM

## 2021-07-03 ENCOUNTER — Ambulatory Visit
Admission: RE | Admit: 2021-07-03 | Discharge: 2021-07-03 | Disposition: A | Discharge status: Home / Self Care | Payer: Medicare Other | Source: Ambulatory Visit | Attending: Geriatric Medicine | Admitting: Geriatric Medicine

## 2021-07-03 DIAGNOSIS — N390 Urinary tract infection, site not specified: Secondary | ICD-10-CM | POA: Diagnosis not present

## 2021-08-06 DIAGNOSIS — N952 Postmenopausal atrophic vaginitis: Secondary | ICD-10-CM | POA: Diagnosis not present

## 2021-08-06 DIAGNOSIS — Z4689 Encounter for fitting and adjustment of other specified devices: Secondary | ICD-10-CM | POA: Diagnosis not present

## 2021-09-05 ENCOUNTER — Encounter (INDEPENDENT_AMBULATORY_CARE_PROVIDER_SITE_OTHER): Payer: Medicare Other | Admitting: Ophthalmology

## 2021-09-05 DIAGNOSIS — H35033 Hypertensive retinopathy, bilateral: Secondary | ICD-10-CM

## 2021-09-05 DIAGNOSIS — I10 Essential (primary) hypertension: Secondary | ICD-10-CM | POA: Diagnosis not present

## 2021-09-05 DIAGNOSIS — H353231 Exudative age-related macular degeneration, bilateral, with active choroidal neovascularization: Secondary | ICD-10-CM

## 2021-09-05 DIAGNOSIS — H43813 Vitreous degeneration, bilateral: Secondary | ICD-10-CM | POA: Diagnosis not present

## 2021-10-10 DIAGNOSIS — R350 Frequency of micturition: Secondary | ICD-10-CM | POA: Diagnosis not present

## 2021-10-22 ENCOUNTER — Other Ambulatory Visit: Payer: Self-pay | Admitting: Internal Medicine

## 2021-10-22 ENCOUNTER — Ambulatory Visit
Admission: RE | Admit: 2021-10-22 | Discharge: 2021-10-22 | Disposition: A | Discharge status: Home / Self Care | Payer: Medicare Other | Source: Ambulatory Visit | Attending: Internal Medicine | Admitting: Internal Medicine

## 2021-10-22 DIAGNOSIS — M546 Pain in thoracic spine: Secondary | ICD-10-CM

## 2021-10-22 DIAGNOSIS — R0781 Pleurodynia: Secondary | ICD-10-CM

## 2021-10-23 ENCOUNTER — Encounter (INDEPENDENT_AMBULATORY_CARE_PROVIDER_SITE_OTHER): Payer: Medicare Other | Admitting: Ophthalmology

## 2021-11-21 DIAGNOSIS — Z4689 Encounter for fitting and adjustment of other specified devices: Secondary | ICD-10-CM | POA: Diagnosis not present

## 2021-11-21 DIAGNOSIS — N952 Postmenopausal atrophic vaginitis: Secondary | ICD-10-CM | POA: Diagnosis not present

## 2021-11-28 ENCOUNTER — Encounter (INDEPENDENT_AMBULATORY_CARE_PROVIDER_SITE_OTHER): Payer: Medicare Other | Admitting: Ophthalmology

## 2021-11-28 DIAGNOSIS — H353231 Exudative age-related macular degeneration, bilateral, with active choroidal neovascularization: Secondary | ICD-10-CM

## 2021-11-28 DIAGNOSIS — I10 Essential (primary) hypertension: Secondary | ICD-10-CM

## 2021-11-28 DIAGNOSIS — H43813 Vitreous degeneration, bilateral: Secondary | ICD-10-CM

## 2021-11-28 DIAGNOSIS — H35033 Hypertensive retinopathy, bilateral: Secondary | ICD-10-CM

## 2021-12-02 DIAGNOSIS — Z23 Encounter for immunization: Secondary | ICD-10-CM | POA: Diagnosis not present

## 2021-12-24 DIAGNOSIS — I10 Essential (primary) hypertension: Secondary | ICD-10-CM | POA: Diagnosis not present

## 2022-01-14 DIAGNOSIS — M7631 Iliotibial band syndrome, right leg: Secondary | ICD-10-CM | POA: Diagnosis not present

## 2022-01-14 DIAGNOSIS — Z96641 Presence of right artificial hip joint: Secondary | ICD-10-CM | POA: Diagnosis not present

## 2022-01-21 IMAGING — DX DG PORTABLE PELVIS
1 series · 1 of 1 positions shown · non-contrast
Comparison: Intraoperative films from earlier in the same day.

CLINICAL DATA: Status post right hip replacement

EXAM:
PORTABLE PELVIS 1 VIEWS

[pelvis ap]
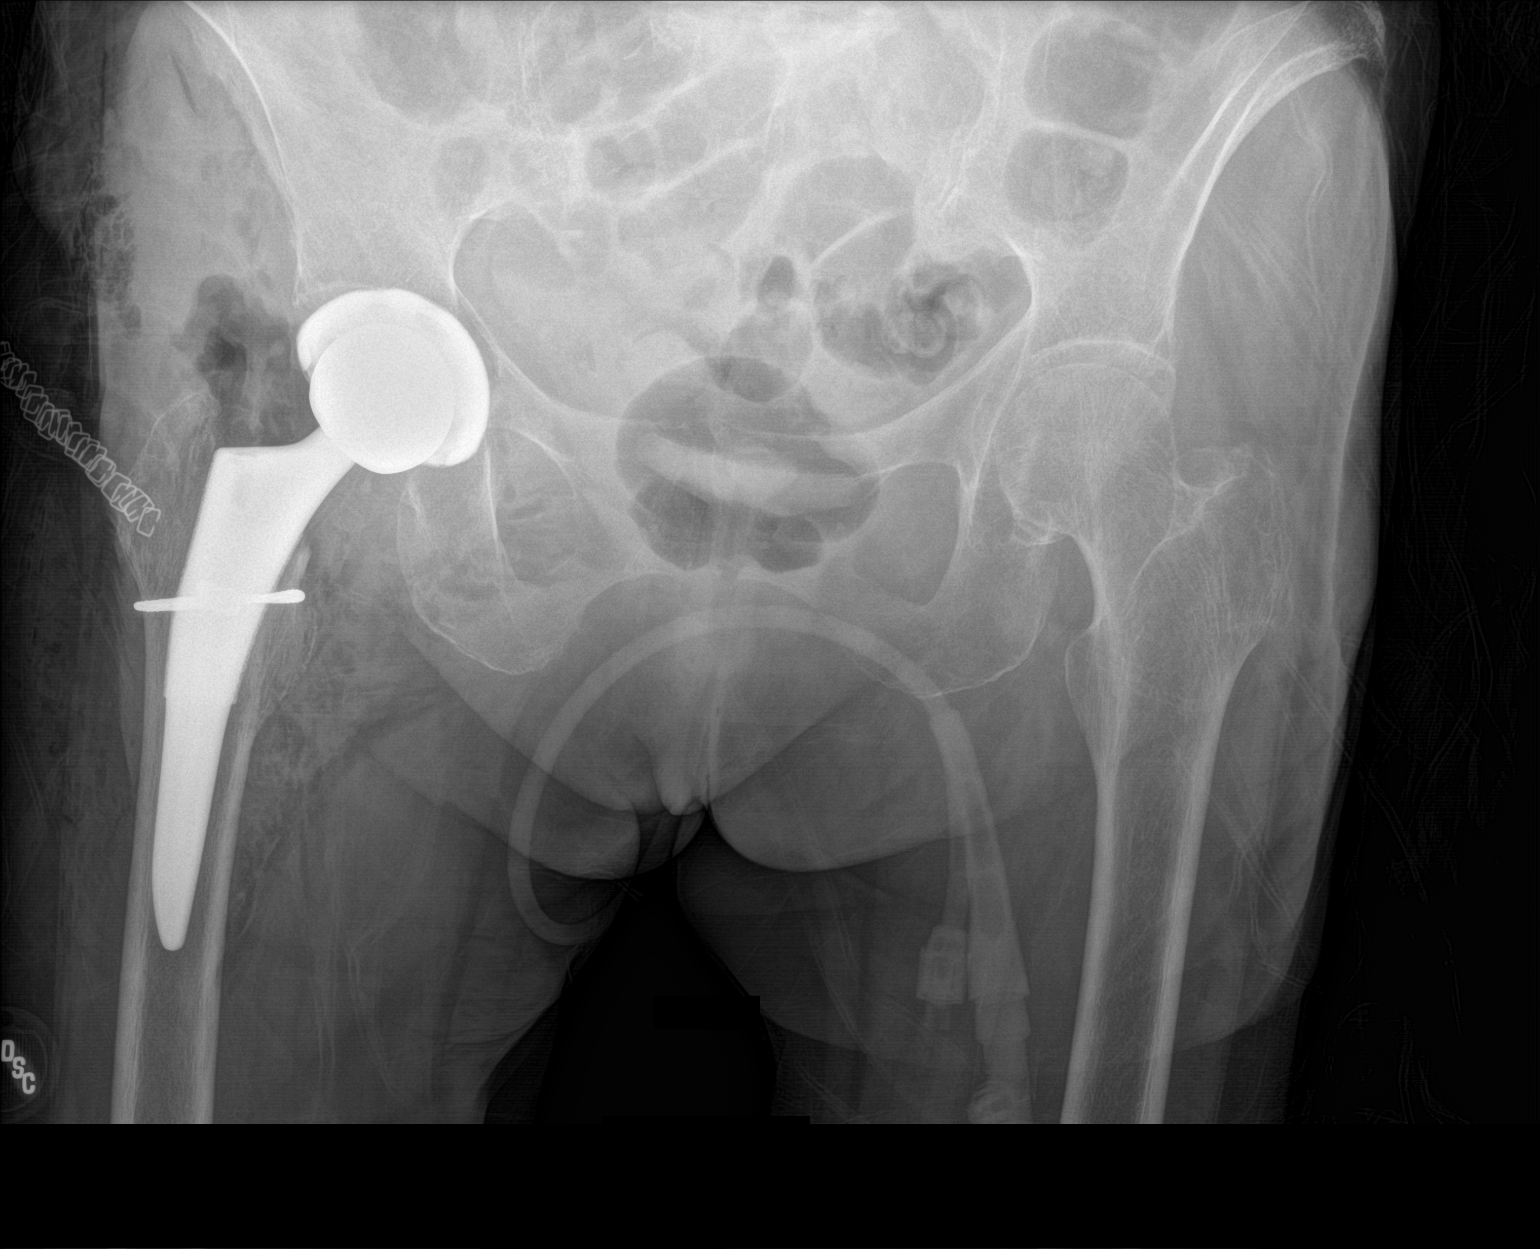

[1 of 1 positions shown; findings below may reference images not displayed]

FINDINGS: Right hip prosthesis is noted in satisfactory position. Proximal
cerclage wire is noted as well. No acute soft tissue abnormality is
seen.
IMPRESSION: Status post right hip replacement.

## 2022-01-21 IMAGING — RF DG HIP (WITH PELVIS) OPERATIVE*R*
1 series · 2 of 2 positions shown · non-contrast
Comparison: Preoperative radiograph 11/06/2020

CLINICAL DATA: Total hip arthroplasty.

EXAM:
OPERATIVE RIGHT HIP (WITH PELVIS IF PERFORMED)
TECHNIQUE: Fluoroscopic spot image(s) were submitted for interpretation
post-operatively.

[Series 1: unknown protocol · 0.20mm/px · 2 of 2 slices shown]
[im 1/2]
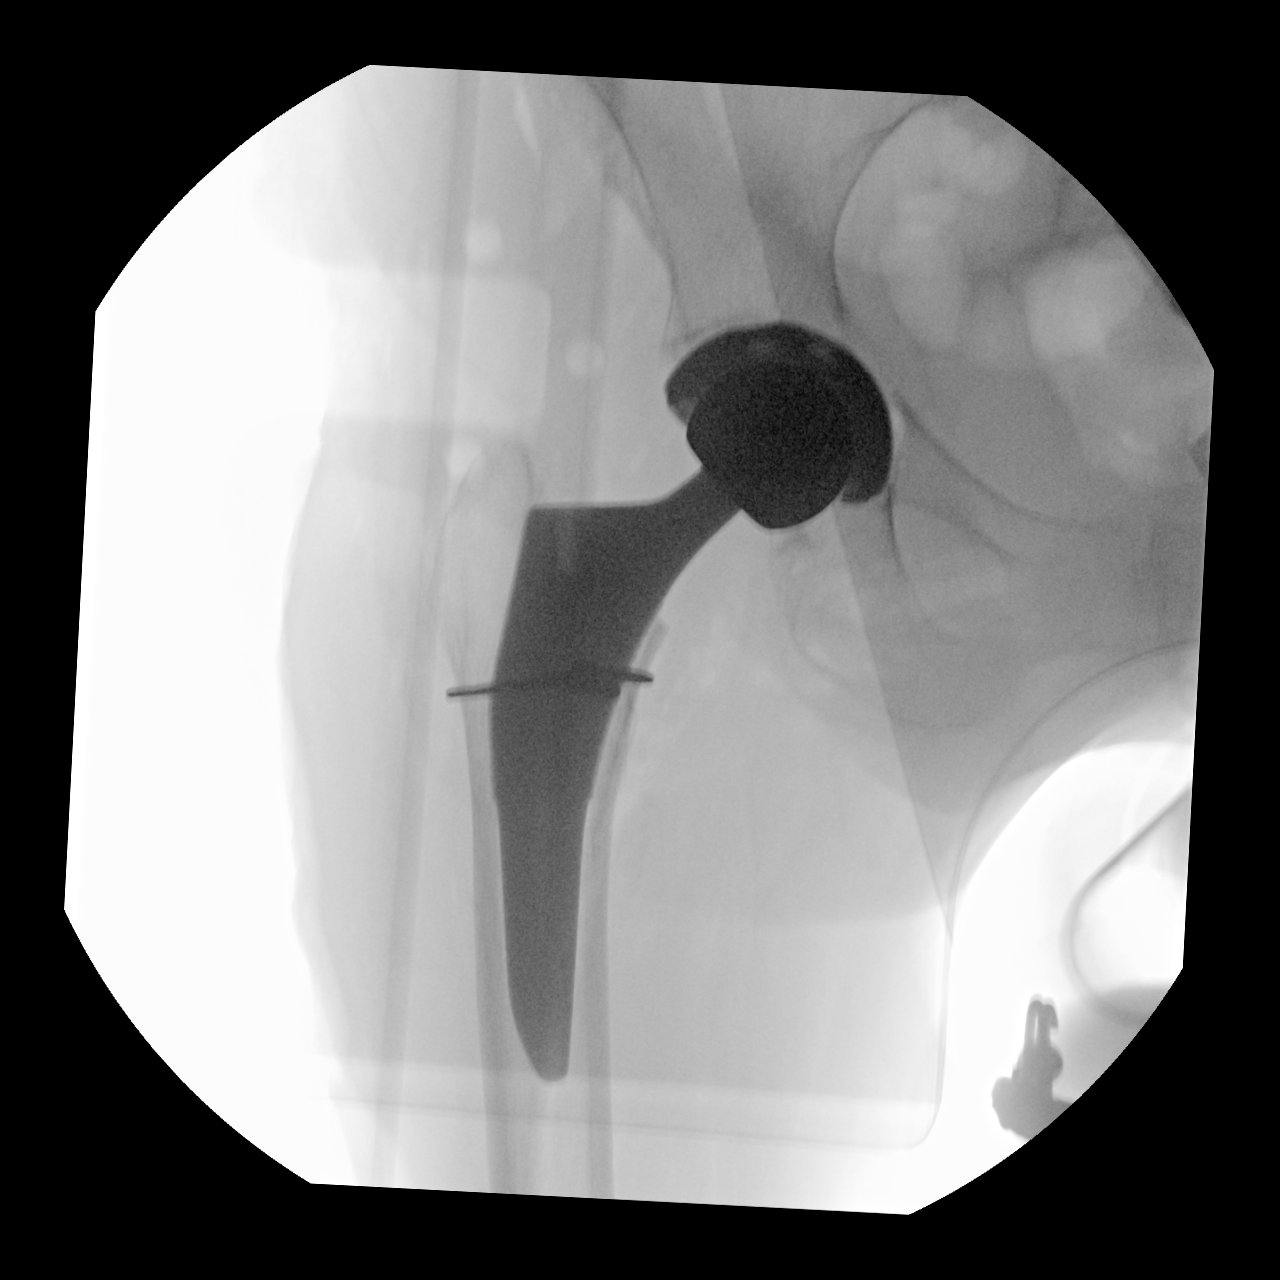
[im 2/2]
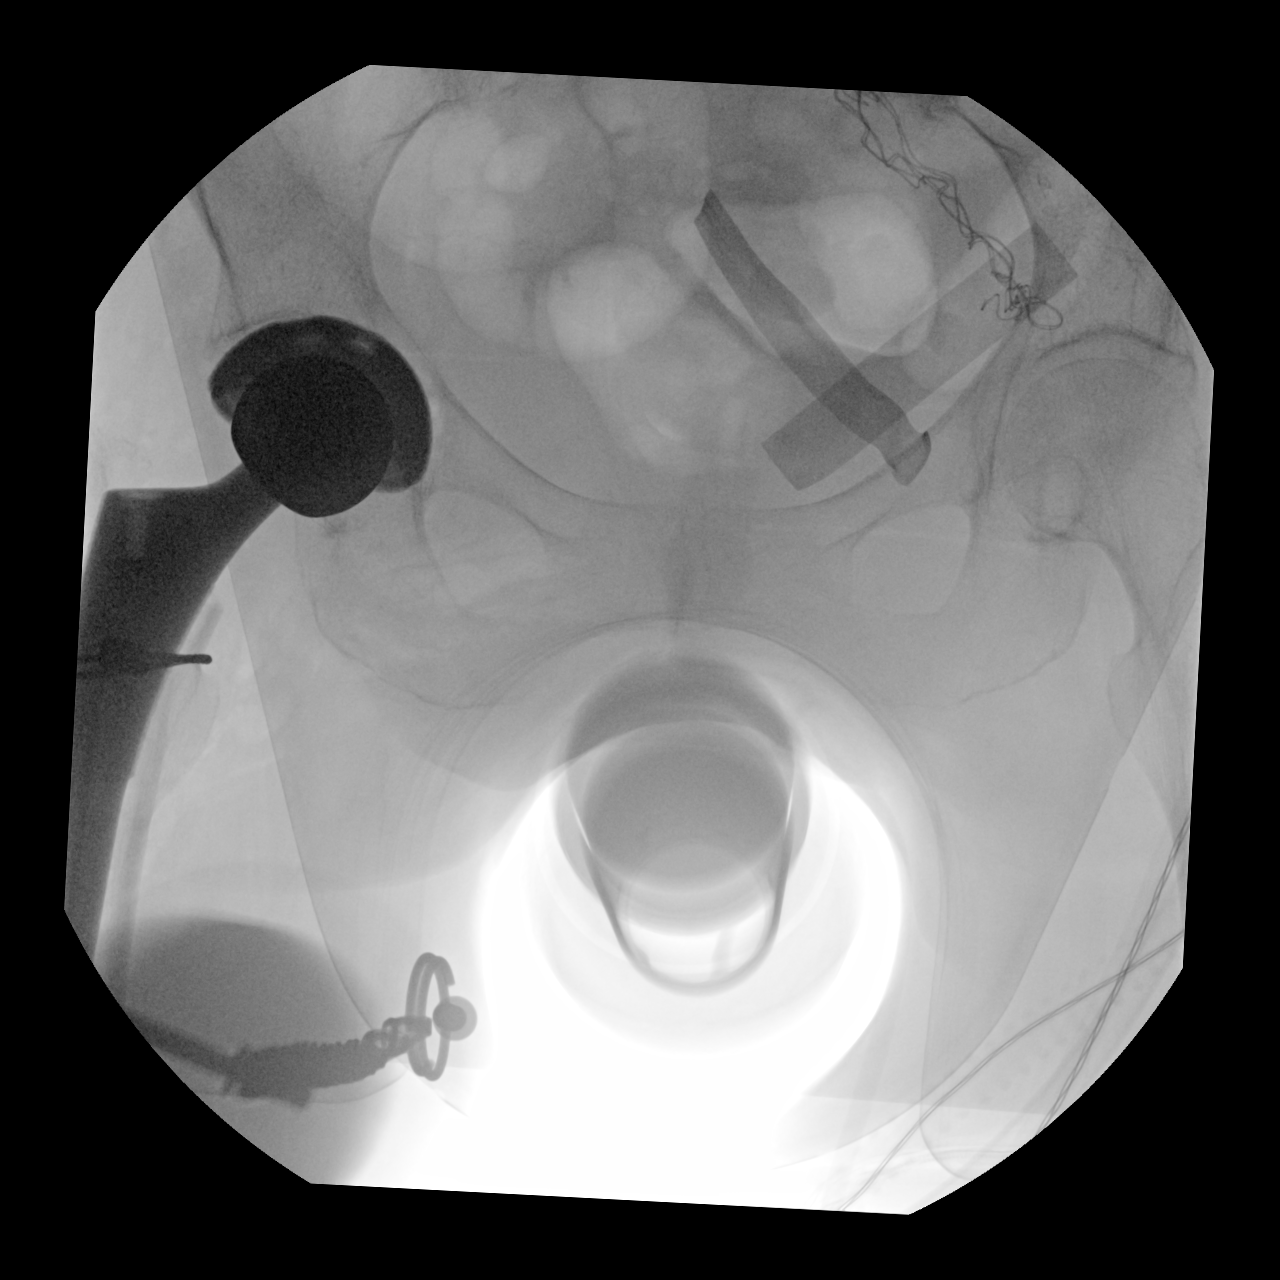

[2 of 2 positions shown; findings below may reference images not displayed]

FINDINGS: Two fluoroscopic spot views of the pelvis and right hip obtained in
the operating room. Right hip arthroplasty with cerclage wire
fixation. Fluoroscopy time 14 seconds.
IMPRESSION: Procedural fluoroscopy for right hip arthroplasty.

## 2022-01-23 IMAGING — DX DG CHEST 1V PORT
1 series · 1 of 1 positions shown · non-contrast
Comparison: 05/23/2020

CLINICAL DATA: Hypoxia

EXAM:
PORTABLE CHEST 1 VIEW

[chest ap]
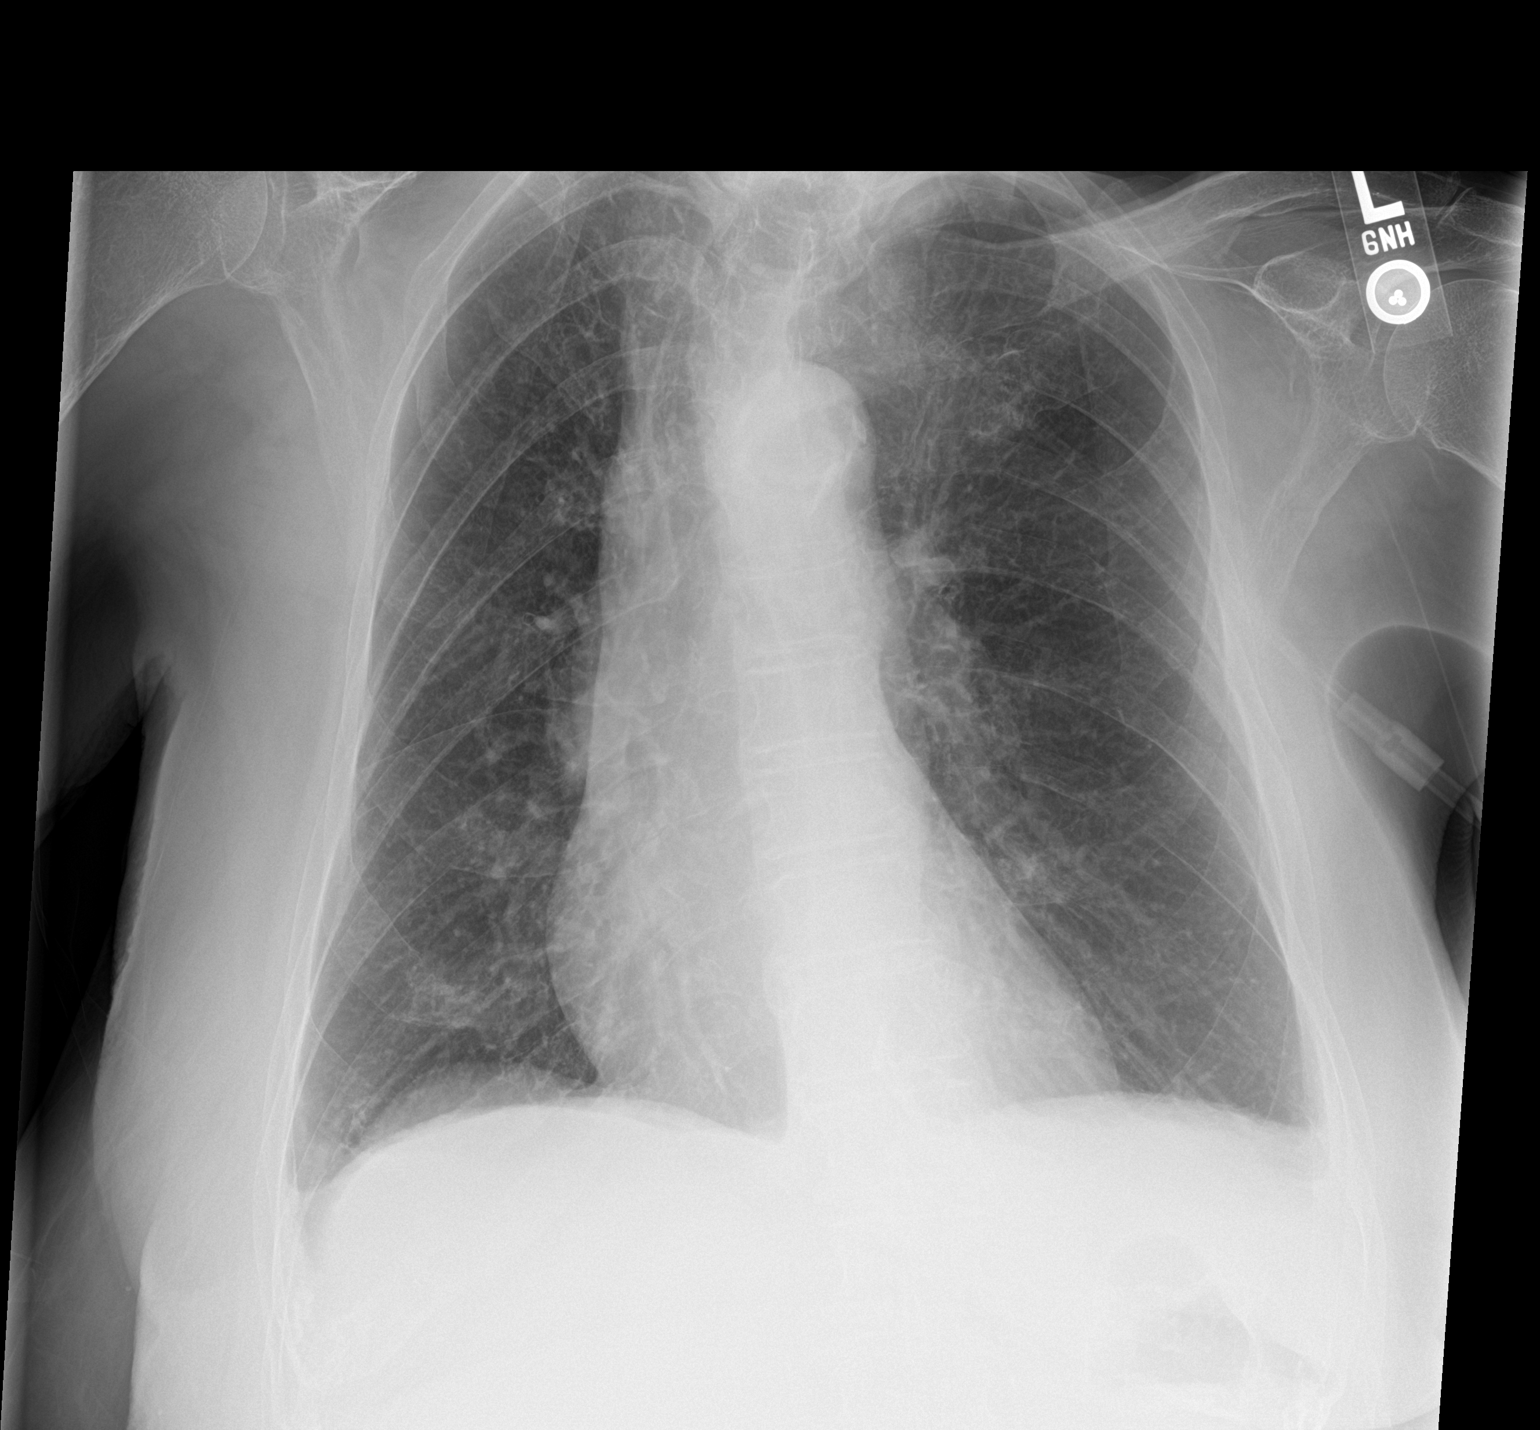

[1 of 1 positions shown; findings below may reference images not displayed]

FINDINGS: The heart size and mediastinal contours are within normal limits.
Both lungs are clear. The visualized skeletal structures are
unremarkable. Aortic atherosclerosis.
IMPRESSION: No active disease.

## 2022-01-30 DIAGNOSIS — M7631 Iliotibial band syndrome, right leg: Secondary | ICD-10-CM | POA: Diagnosis not present

## 2022-01-30 DIAGNOSIS — M6281 Muscle weakness (generalized): Secondary | ICD-10-CM | POA: Diagnosis not present

## 2022-01-30 DIAGNOSIS — M25551 Pain in right hip: Secondary | ICD-10-CM | POA: Diagnosis not present

## 2022-02-04 DIAGNOSIS — M6281 Muscle weakness (generalized): Secondary | ICD-10-CM | POA: Diagnosis not present

## 2022-02-04 DIAGNOSIS — M7631 Iliotibial band syndrome, right leg: Secondary | ICD-10-CM | POA: Diagnosis not present

## 2022-02-04 DIAGNOSIS — M25551 Pain in right hip: Secondary | ICD-10-CM | POA: Diagnosis not present

## 2022-02-11 DIAGNOSIS — M7631 Iliotibial band syndrome, right leg: Secondary | ICD-10-CM | POA: Diagnosis not present

## 2022-02-11 DIAGNOSIS — M6281 Muscle weakness (generalized): Secondary | ICD-10-CM | POA: Diagnosis not present

## 2022-02-11 DIAGNOSIS — M25551 Pain in right hip: Secondary | ICD-10-CM | POA: Diagnosis not present

## 2022-02-12 DIAGNOSIS — M7631 Iliotibial band syndrome, right leg: Secondary | ICD-10-CM | POA: Diagnosis not present

## 2022-02-12 DIAGNOSIS — M6281 Muscle weakness (generalized): Secondary | ICD-10-CM | POA: Diagnosis not present

## 2022-02-12 DIAGNOSIS — M25551 Pain in right hip: Secondary | ICD-10-CM | POA: Diagnosis not present

## 2022-02-17 DIAGNOSIS — M6281 Muscle weakness (generalized): Secondary | ICD-10-CM | POA: Diagnosis not present

## 2022-02-17 DIAGNOSIS — M25551 Pain in right hip: Secondary | ICD-10-CM | POA: Diagnosis not present

## 2022-02-17 DIAGNOSIS — M7631 Iliotibial band syndrome, right leg: Secondary | ICD-10-CM | POA: Diagnosis not present

## 2022-02-19 DIAGNOSIS — M7631 Iliotibial band syndrome, right leg: Secondary | ICD-10-CM | POA: Diagnosis not present

## 2022-02-19 DIAGNOSIS — M6281 Muscle weakness (generalized): Secondary | ICD-10-CM | POA: Diagnosis not present

## 2022-02-19 DIAGNOSIS — M25551 Pain in right hip: Secondary | ICD-10-CM | POA: Diagnosis not present

## 2022-02-20 DIAGNOSIS — M6281 Muscle weakness (generalized): Secondary | ICD-10-CM | POA: Diagnosis not present

## 2022-02-20 DIAGNOSIS — M7631 Iliotibial band syndrome, right leg: Secondary | ICD-10-CM | POA: Diagnosis not present

## 2022-02-20 DIAGNOSIS — M25551 Pain in right hip: Secondary | ICD-10-CM | POA: Diagnosis not present

## 2022-02-27 ENCOUNTER — Encounter (INDEPENDENT_AMBULATORY_CARE_PROVIDER_SITE_OTHER): Payer: Medicare Other | Admitting: Ophthalmology

## 2022-02-27 DIAGNOSIS — H35033 Hypertensive retinopathy, bilateral: Secondary | ICD-10-CM

## 2022-02-27 DIAGNOSIS — H43813 Vitreous degeneration, bilateral: Secondary | ICD-10-CM

## 2022-02-27 DIAGNOSIS — H353231 Exudative age-related macular degeneration, bilateral, with active choroidal neovascularization: Secondary | ICD-10-CM

## 2022-02-27 DIAGNOSIS — I10 Essential (primary) hypertension: Secondary | ICD-10-CM | POA: Diagnosis not present

## 2022-03-10 DIAGNOSIS — Z4689 Encounter for fitting and adjustment of other specified devices: Secondary | ICD-10-CM | POA: Diagnosis not present

## 2022-03-10 DIAGNOSIS — N952 Postmenopausal atrophic vaginitis: Secondary | ICD-10-CM | POA: Diagnosis not present

## 2022-03-10 DIAGNOSIS — N814 Uterovaginal prolapse, unspecified: Secondary | ICD-10-CM | POA: Diagnosis not present

## 2022-03-20 ENCOUNTER — Encounter: Payer: Self-pay | Admitting: Internal Medicine

## 2022-03-20 ENCOUNTER — Ambulatory Visit: Payer: Medicare Other | Admitting: Internal Medicine

## 2022-03-20 VITALS — BP 128/90 | HR 67 | Temp 97.6°F | Resp 18 | Ht 62.0 in | Wt 106.0 lb

## 2022-03-20 DIAGNOSIS — R350 Frequency of micturition: Secondary | ICD-10-CM | POA: Diagnosis not present

## 2022-03-20 NOTE — Progress Notes (Signed)
Office Visit  Subjective   Patient ID: LATAJAH THUMAN   DOB: 1929/09/18   Age: 89 y.o.   MRN: [id number]   Chief Complaint Chief Complaint  Patient presents with   Urinary Tract Infection    Urinary symptoms     History of Present Illness The patient is a 89 yo female who comes in today with complaints of increased urinary frequency.  She does have a pessary in place which was changed over by her OB/GYN last week.  She denies any f/c, abdominal, n/v, flank/back pain, hematura, dysuria.       Past Medical History Past Medical History:  Diagnosis Date   Abnormal albumin    LOW   HTN (hypertension)    Hyperlipidemia    Hypertension    Hypokalemia    Hypomagnesemia    Moderate anxiety    SERVE STRESS   Numbness on right side    SECONDARY TO ELECTROLYTES ABNORMALITIES     Allergies Allergies  Allergen Reactions   Demerol Other (See Comments)    Passed out   Iohexol      Code: RASH, Desc: Pt states she had an IVP 15-19yr ago and broke out in a rash all over her body.  She's never had IV contrast since., Onset Date: [id number]   Suprax [Cefixime] Other (See Comments)    c diff   Latex Rash   Penicillins Rash     Medications  Current Outpatient Medications:    amLODipine (NORVASC) 10 MG tablet, Take 10 mg by mouth at bedtime., Disp: , Rfl:    BESIVANCE 0.6 % SUSP, Place 1 drop into both eyes in the morning, at noon, in the evening, and at bedtime. Starts 2 days after eye injection every 6 weeks, Disp: , Rfl:    Calcium Carbonate (CALCIUM 600 PO), Take 1 tablet by mouth 2 (two) times daily., Disp: , Rfl:    Coenzyme Q-10 100 MG capsule, Take 100 mg by mouth daily., Disp: , Rfl:    diclofenac Sodium (VOLTAREN) 1 % GEL, Apply 4 g topically 4 (four) times daily., Disp: 100 g, Rfl: 0   estradiol (ESTRACE) 0.1 MG/GM vaginal cream, Place 1 Applicatorful vaginally 3 (three) times a week. Helps prevent UTI, Disp: , Rfl:    HYDROcodone-acetaminophen (NORCO/VICODIN) 5-325 MG  tablet, Take 1-2 tablets by mouth every 4 (four) hours as needed for moderate pain (pain score 4-6)., Disp: 30 tablet, Rfl: 0   lidocaine (LIDODERM) 5 %, Place 1 patch onto the skin daily. Remove & Discard patch within 12 hours or as directed by MD, Disp: 30 patch, Rfl: 0   Multiple Vitamin (MULTIVITAMIN WITH MINERALS) TABS, Take 1 tablet by mouth daily., Disp: , Rfl:    Omega-3 1000 MG CAPS, Take 1 g by mouth daily., Disp: , Rfl:    omeprazole (PRILOSEC OTC) 20 MG tablet, Take 20 mg by mouth daily., Disp: , Rfl:    polyethylene glycol (MIRALAX / GLYCOLAX) 17 g packet, Take 17 g by mouth daily., Disp: 14 each, Rfl: 0   quinapril (ACCUPRIL) 40 MG tablet, Take 40 mg by mouth daily., Disp: , Rfl:    rosuvastatin (CRESTOR) 10 MG tablet, Take 10 mg by mouth every other day., Disp: , Rfl:    saccharomyces boulardii (FLORASTOR) 250 MG capsule, Take 1 capsule by mouth every other day., Disp: , Rfl:    senna (SENOKOT) 8.6 MG TABS tablet, Take 1 tablet (8.6 mg total) by mouth 2 (two) times daily., Disp:  120 tablet, Rfl: 0   Review of Systems Review of Systems  Constitutional:  Negative for chills and fever.  Respiratory:  Negative for cough and shortness of breath.   Gastrointestinal:  Negative for constipation, diarrhea, nausea and vomiting.  Genitourinary:  Positive for frequency. Negative for dysuria, flank pain and hematuria.  Musculoskeletal:  Negative for myalgias.       Objective:    Vitals BP (!) 128/90 (BP Location: Left Arm, Patient Position: Sitting, Cuff Size: Normal)   Pulse 67   Temp 97.6 F (36.4 C)   Resp 18   Ht '5\' 2"'$  (1.575 m)   Wt 106 lb (48.1 kg)   SpO2 97%   BMI 19.39 kg/m    Physical Examination Physical Exam Constitutional:      Appearance: Normal appearance. She is not ill-appearing.  Cardiovascular:     Rate and Rhythm: Normal rate and regular rhythm.     Pulses: Normal pulses.     Heart sounds: No murmur heard.    No friction rub. No gallop.  Pulmonary:      Effort: Pulmonary effort is normal. No respiratory distress.     Breath sounds: No wheezing, rhonchi or rales.  Abdominal:     General: Bowel sounds are normal. There is no distension.     Palpations: Abdomen is soft.     Tenderness: There is no abdominal tenderness.  Musculoskeletal:     Right lower leg: No edema.     Left lower leg: No edema.  Skin:    General: Skin is warm and dry.     Findings: No rash.  Neurological:     Mental Status: She is alert.        Assessment & Plan:   Urinary frequency We did a UA today which was normal.  She was reassured she did not have a UTI.    No follow-ups on file.   Townsend Roger, MD

## 2022-03-20 NOTE — Assessment & Plan Note (Signed)
We did a UA today which was normal.  She was reassured she did not have a UTI.

## 2022-03-25 DIAGNOSIS — D1801 Hemangioma of skin and subcutaneous tissue: Secondary | ICD-10-CM | POA: Diagnosis not present

## 2022-03-25 DIAGNOSIS — L570 Actinic keratosis: Secondary | ICD-10-CM | POA: Diagnosis not present

## 2022-03-25 DIAGNOSIS — L820 Inflamed seborrheic keratosis: Secondary | ICD-10-CM | POA: Diagnosis not present

## 2022-03-25 DIAGNOSIS — Z85828 Personal history of other malignant neoplasm of skin: Secondary | ICD-10-CM | POA: Diagnosis not present

## 2022-03-25 DIAGNOSIS — L821 Other seborrheic keratosis: Secondary | ICD-10-CM | POA: Diagnosis not present

## 2022-03-25 DIAGNOSIS — D225 Melanocytic nevi of trunk: Secondary | ICD-10-CM | POA: Diagnosis not present

## 2022-04-15 DIAGNOSIS — H00024 Hordeolum internum left upper eyelid: Secondary | ICD-10-CM | POA: Diagnosis not present

## 2022-05-02 DIAGNOSIS — H00024 Hordeolum internum left upper eyelid: Secondary | ICD-10-CM | POA: Diagnosis not present

## 2022-05-13 ENCOUNTER — Ambulatory Visit: Payer: Medicare Other | Admitting: Internal Medicine

## 2022-05-13 VITALS — BP 120/68 | HR 97 | Temp 97.7°F | Wt 107.5 lb

## 2022-05-13 DIAGNOSIS — R200 Anesthesia of skin: Secondary | ICD-10-CM | POA: Insufficient documentation

## 2022-05-13 DIAGNOSIS — R531 Weakness: Secondary | ICD-10-CM | POA: Insufficient documentation

## 2022-05-13 DIAGNOSIS — N390 Urinary tract infection, site not specified: Secondary | ICD-10-CM | POA: Diagnosis not present

## 2022-05-13 LAB — POCT URINALYSIS DIPSTICK
Bilirubin, UA: NEGATIVE
Blood, UA: NEGATIVE
Glucose, UA: NEGATIVE
Ketones, UA: NEGATIVE
Leukocytes, UA: NEGATIVE
Nitrite, UA: NEGATIVE
Protein, UA: NEGATIVE
Spec Grav, UA: 1.01 (ref 1.010–1.025)
Urobilinogen, UA: NEGATIVE E.U./dL — AB
pH, UA: 5 (ref 5.0–8.0)

## 2022-05-13 NOTE — Progress Notes (Signed)
   Acute Office Visit  Subjective:     Patient ID: [name] [name], female    DOB: [birth year], 89 y.o.   MRN: [id number]  Chief Complaint  Patient presents with   office visit    Possible UTI    HPI Patient is in today for not feeling well she is wondering if she has urinary tract infection.  She denies having any dysuria, flank pain or bladder pain.  He says that she just feels lousy. She also has shingle on the left side face and she says that she still has a burning feeling there.  She used to take gabapentin but that was stopped.  But she still has gabapentin at home.  Review of Systems  Constitutional:  Positive for malaise/fatigue.  Respiratory: Negative.    Cardiovascular: Negative.   Gastrointestinal: Negative.         Objective:    BP 120/68 (BP Location: Left Arm, Patient Position: Sitting, Cuff Size: Normal)   Pulse 97   Temp 97.7 F (36.5 C)   Wt 107 lb 8 oz (48.8 kg)   SpO2 99%   BMI 19.66 kg/m    Physical Exam Constitutional:      Appearance: Normal appearance.  Abdominal:     General: Bowel sounds are normal.     Palpations: Abdomen is soft.  Neurological:     General: No focal deficit present.     Mental Status: She is alert and oriented to person, place, and time.     Results for orders placed or performed in visit on 05/13/22  POCT urinalysis dipstick  Result Value Ref Range   Color, UA clear    Clarity, UA clear    Glucose, UA Negative Negative   Bilirubin, UA neg    Ketones, UA neg    Spec Grav, UA 1.010 1.010 - 1.025   Blood, UA neg    pH, UA 5.0 5.0 - 8.0   Protein, UA Negative Negative   Urobilinogen, UA negative (A) 0.2 or 1.0 E.U./dL   Nitrite, UA neg    Leukocytes, UA Negative Negative   Appearance clear    Odor no odor         Assessment & Plan:   Problem List Items Addressed This Visit       Other   Numbness on right side    She will try low dose gabapentine to see if that helps      Weakness    Her UA is  good, I have reassured her that her urine has no sign of UTI. She will follow up with her PCP      Other Visit Diagnoses     Urinary tract infection without hematuria, site unspecified    -  Primary   Relevant Orders   POCT urinalysis dipstick (Completed)       No orders of the defined types were placed in this encounter.   No follow-ups on file.  Garwin Brothers, MD

## 2022-05-13 NOTE — Assessment & Plan Note (Signed)
Her UA is good, I have reassured her that her urine has no sign of UTI. She will follow up with her PCP

## 2022-05-13 NOTE — Assessment & Plan Note (Signed)
She will try low dose gabapentine to see if that helps

## 2022-05-22 DIAGNOSIS — H00024 Hordeolum internum left upper eyelid: Secondary | ICD-10-CM | POA: Diagnosis not present

## 2022-06-04 ENCOUNTER — Encounter (INDEPENDENT_AMBULATORY_CARE_PROVIDER_SITE_OTHER): Payer: Medicare Other | Admitting: Ophthalmology

## 2022-06-04 DIAGNOSIS — H35033 Hypertensive retinopathy, bilateral: Secondary | ICD-10-CM

## 2022-06-04 DIAGNOSIS — H353231 Exudative age-related macular degeneration, bilateral, with active choroidal neovascularization: Secondary | ICD-10-CM

## 2022-06-04 DIAGNOSIS — H43813 Vitreous degeneration, bilateral: Secondary | ICD-10-CM

## 2022-06-04 DIAGNOSIS — I10 Essential (primary) hypertension: Secondary | ICD-10-CM | POA: Diagnosis not present

## 2022-06-10 DIAGNOSIS — N814 Uterovaginal prolapse, unspecified: Secondary | ICD-10-CM | POA: Diagnosis not present

## 2022-06-10 DIAGNOSIS — I10 Essential (primary) hypertension: Secondary | ICD-10-CM | POA: Diagnosis not present

## 2022-06-10 DIAGNOSIS — N952 Postmenopausal atrophic vaginitis: Secondary | ICD-10-CM | POA: Diagnosis not present

## 2022-06-10 DIAGNOSIS — Z4689 Encounter for fitting and adjustment of other specified devices: Secondary | ICD-10-CM | POA: Diagnosis not present

## 2022-07-21 DIAGNOSIS — R040 Epistaxis: Secondary | ICD-10-CM | POA: Diagnosis not present

## 2022-07-21 DIAGNOSIS — B0229 Other postherpetic nervous system involvement: Secondary | ICD-10-CM | POA: Diagnosis not present

## 2022-08-20 DIAGNOSIS — H353231 Exudative age-related macular degeneration, bilateral, with active choroidal neovascularization: Secondary | ICD-10-CM | POA: Diagnosis not present

## 2022-08-20 DIAGNOSIS — I10 Essential (primary) hypertension: Secondary | ICD-10-CM | POA: Diagnosis not present

## 2022-08-20 DIAGNOSIS — R040 Epistaxis: Secondary | ICD-10-CM | POA: Diagnosis not present

## 2022-08-20 DIAGNOSIS — E7800 Pure hypercholesterolemia, unspecified: Secondary | ICD-10-CM | POA: Diagnosis not present

## 2022-08-20 DIAGNOSIS — K219 Gastro-esophageal reflux disease without esophagitis: Secondary | ICD-10-CM | POA: Diagnosis not present

## 2022-08-20 DIAGNOSIS — K9089 Other intestinal malabsorption: Secondary | ICD-10-CM | POA: Diagnosis not present

## 2022-08-20 DIAGNOSIS — Z0000 Encounter for general adult medical examination without abnormal findings: Secondary | ICD-10-CM | POA: Diagnosis not present

## 2022-08-20 DIAGNOSIS — I700 Atherosclerosis of aorta: Secondary | ICD-10-CM | POA: Diagnosis not present

## 2022-08-20 DIAGNOSIS — Z79899 Other long term (current) drug therapy: Secondary | ICD-10-CM | POA: Diagnosis not present

## 2022-08-20 DIAGNOSIS — B0229 Other postherpetic nervous system involvement: Secondary | ICD-10-CM | POA: Diagnosis not present

## 2022-08-27 DIAGNOSIS — T8389XA Other specified complication of genitourinary prosthetic devices, implants and grafts, initial encounter: Secondary | ICD-10-CM | POA: Diagnosis not present

## 2022-08-27 DIAGNOSIS — Z4689 Encounter for fitting and adjustment of other specified devices: Secondary | ICD-10-CM | POA: Diagnosis not present

## 2022-08-27 DIAGNOSIS — N8110 Cystocele, unspecified: Secondary | ICD-10-CM | POA: Diagnosis not present

## 2022-08-27 DIAGNOSIS — N898 Other specified noninflammatory disorders of vagina: Secondary | ICD-10-CM | POA: Diagnosis not present

## 2022-08-27 DIAGNOSIS — N952 Postmenopausal atrophic vaginitis: Secondary | ICD-10-CM | POA: Diagnosis not present

## 2022-09-03 ENCOUNTER — Encounter (INDEPENDENT_AMBULATORY_CARE_PROVIDER_SITE_OTHER): Payer: Medicare Other | Admitting: Ophthalmology

## 2022-09-05 ENCOUNTER — Encounter (INDEPENDENT_AMBULATORY_CARE_PROVIDER_SITE_OTHER): Payer: Medicare Other | Admitting: Ophthalmology

## 2022-09-05 DIAGNOSIS — H43813 Vitreous degeneration, bilateral: Secondary | ICD-10-CM | POA: Diagnosis not present

## 2022-09-05 DIAGNOSIS — H353231 Exudative age-related macular degeneration, bilateral, with active choroidal neovascularization: Secondary | ICD-10-CM | POA: Diagnosis not present

## 2022-09-05 DIAGNOSIS — I10 Essential (primary) hypertension: Secondary | ICD-10-CM

## 2022-09-05 DIAGNOSIS — H35033 Hypertensive retinopathy, bilateral: Secondary | ICD-10-CM

## 2022-09-10 DIAGNOSIS — N898 Other specified noninflammatory disorders of vagina: Secondary | ICD-10-CM | POA: Diagnosis not present

## 2022-09-10 DIAGNOSIS — T8389XA Other specified complication of genitourinary prosthetic devices, implants and grafts, initial encounter: Secondary | ICD-10-CM | POA: Diagnosis not present

## 2022-09-10 DIAGNOSIS — Z4689 Encounter for fitting and adjustment of other specified devices: Secondary | ICD-10-CM | POA: Diagnosis not present

## 2022-09-10 DIAGNOSIS — R350 Frequency of micturition: Secondary | ICD-10-CM | POA: Diagnosis not present

## 2022-09-10 DIAGNOSIS — N952 Postmenopausal atrophic vaginitis: Secondary | ICD-10-CM | POA: Diagnosis not present

## 2022-09-15 IMAGING — US US PELVIS LIMITED
1 series · 14 of 18 positions shown · non-contrast
Comparison: 11/07/2020

CLINICAL DATA: Recurrent urinary tract infection, assess for
postvoid residual

EXAM:
LIMITED ULTRASOUND OF PELVIS
TECHNIQUE: Limited transabdominal ultrasound examination of the pelvis was
performed.

[Series 1: us pelvis limited · 0.20mm/px · 14 of 18 slices shown]
[im 1/18]
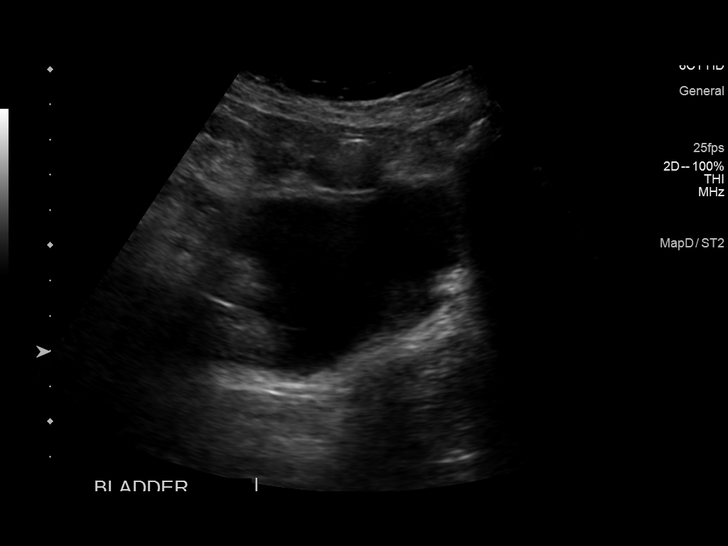
[im 2/18]
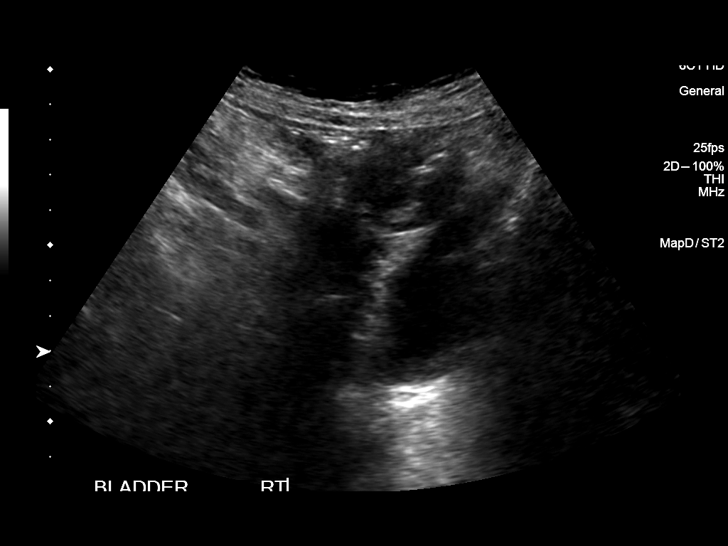
[im 4/18]
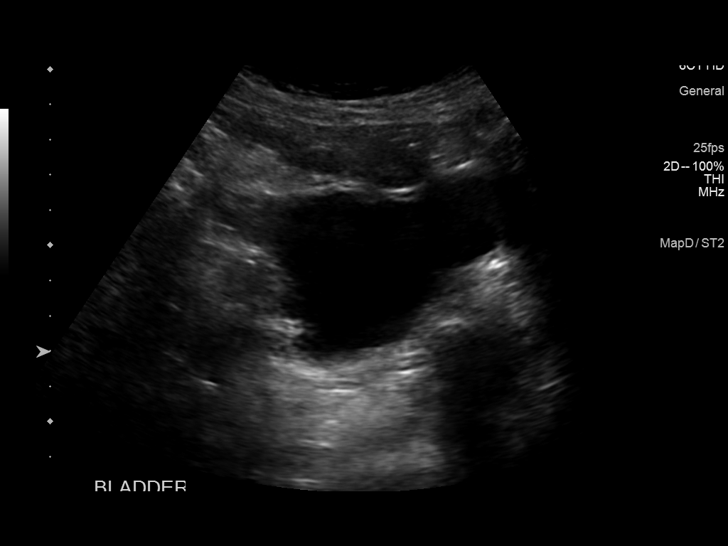
[im 5/18]
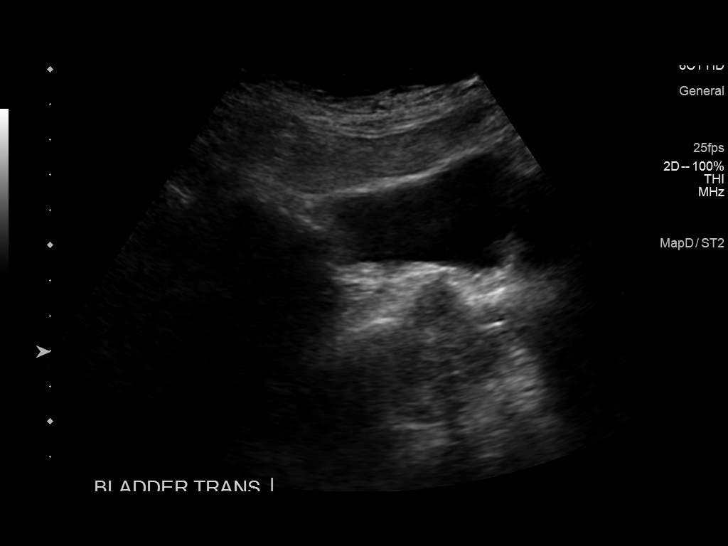
[im 6/18]
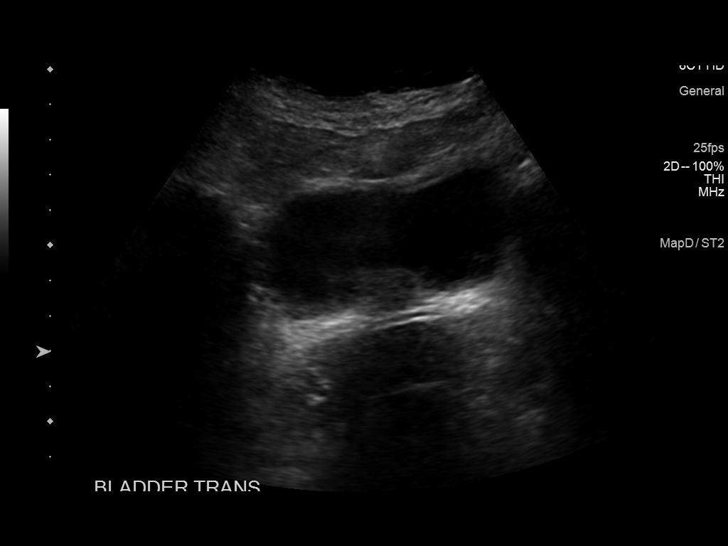
[im 8/18]
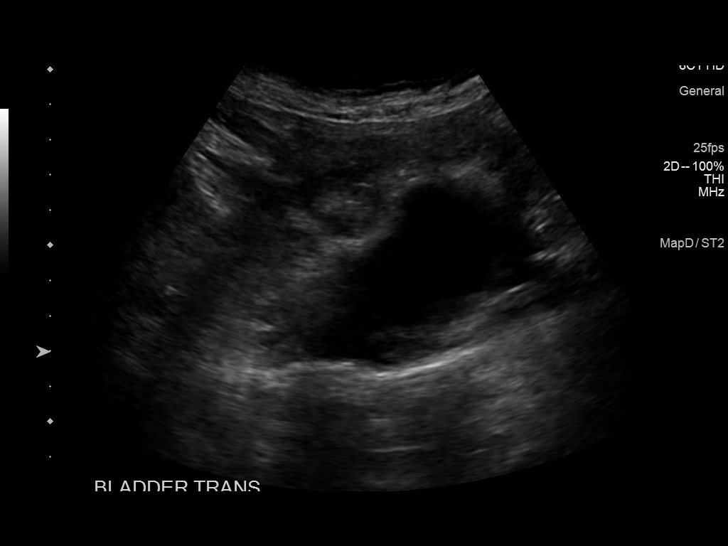
[im 9/18]
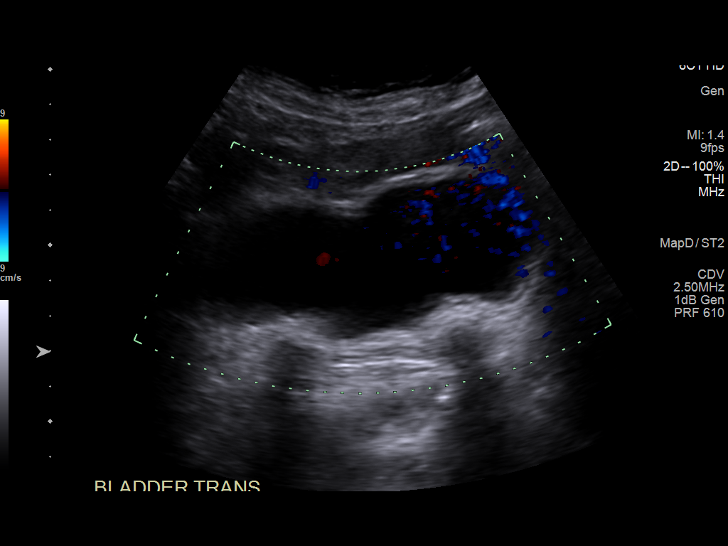
[im 10/18]
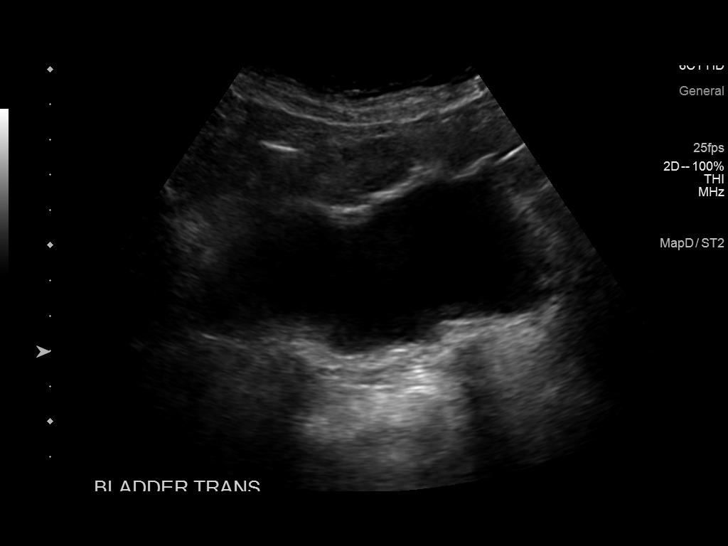
[im 11/18]
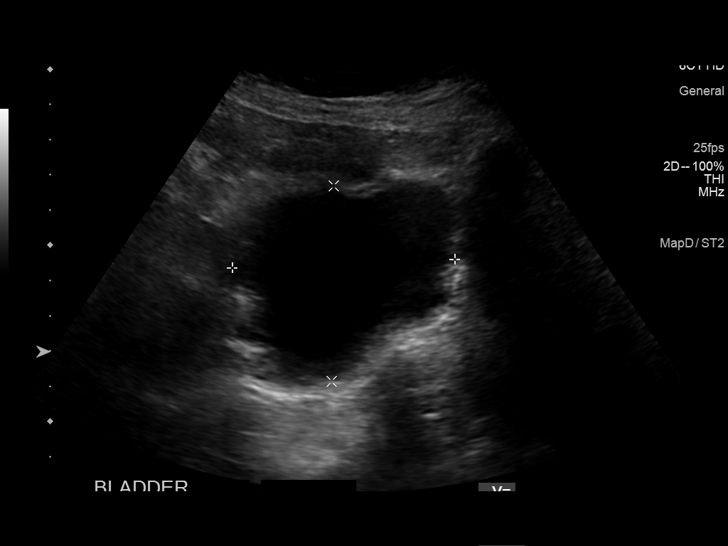
[im 13/18]
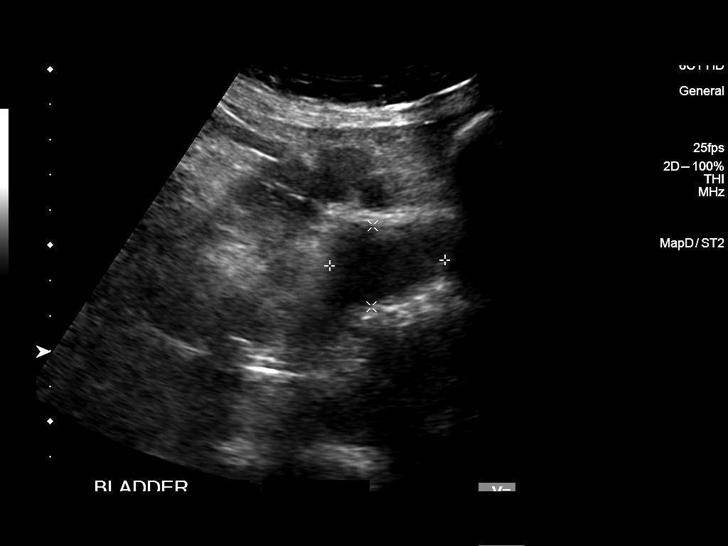
[im 14/18]
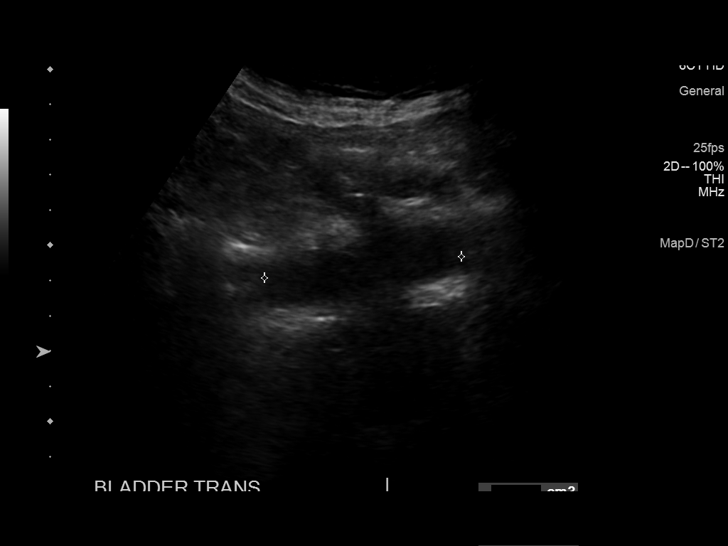
[im 15/18]
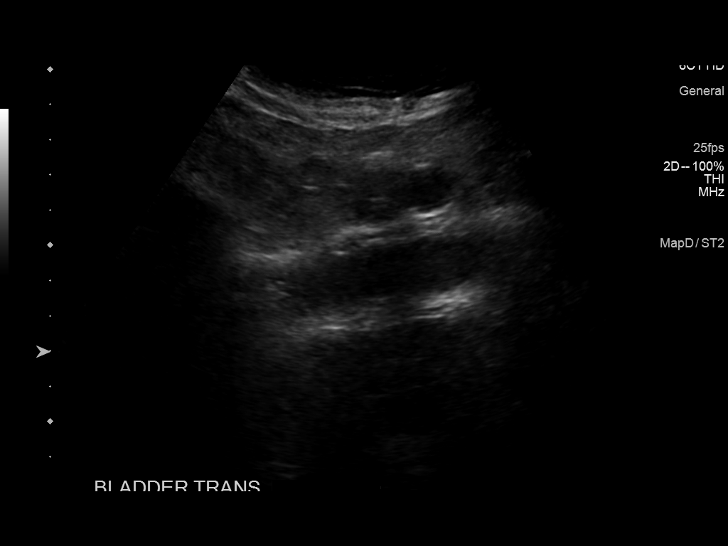
[im 17/18]
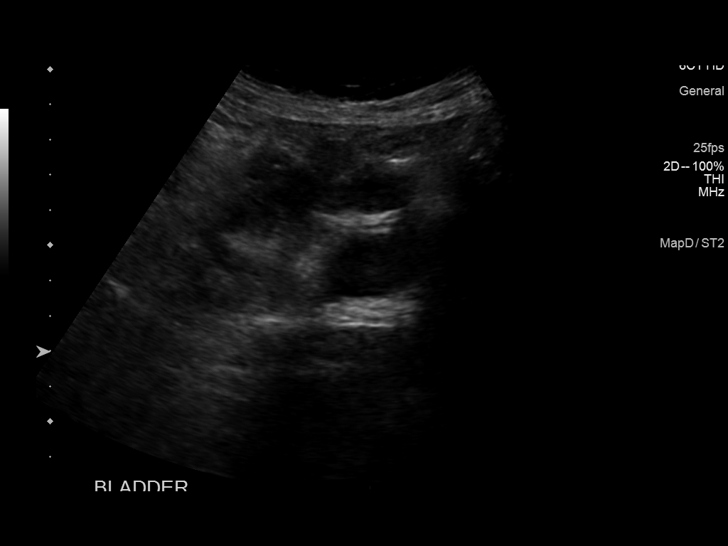
[im 18/18]
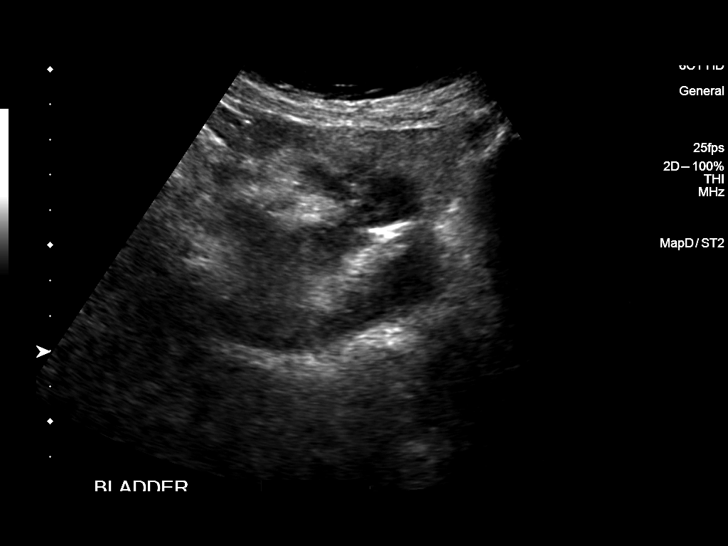

[14 of 18 positions shown; findings below may reference images not displayed]

FINDINGS: The urinary bladder has a normal appearance for the degree of
distention.

Pre-void volume: 161 mL

Post-void volume: 22 mL

Other findings:  None.
IMPRESSION: 1. Minimal postvoid residual measuring 22 cc.

## 2022-09-17 DIAGNOSIS — N814 Uterovaginal prolapse, unspecified: Secondary | ICD-10-CM | POA: Diagnosis not present

## 2022-09-17 DIAGNOSIS — Z4689 Encounter for fitting and adjustment of other specified devices: Secondary | ICD-10-CM | POA: Diagnosis not present

## 2022-09-17 DIAGNOSIS — N952 Postmenopausal atrophic vaginitis: Secondary | ICD-10-CM | POA: Diagnosis not present

## 2022-09-22 DIAGNOSIS — N952 Postmenopausal atrophic vaginitis: Secondary | ICD-10-CM | POA: Diagnosis not present

## 2022-09-22 DIAGNOSIS — N814 Uterovaginal prolapse, unspecified: Secondary | ICD-10-CM | POA: Diagnosis not present

## 2022-09-22 DIAGNOSIS — Z4689 Encounter for fitting and adjustment of other specified devices: Secondary | ICD-10-CM | POA: Diagnosis not present

## 2022-10-15 DIAGNOSIS — R143 Flatulence: Secondary | ICD-10-CM | POA: Diagnosis not present

## 2022-10-15 DIAGNOSIS — Z4689 Encounter for fitting and adjustment of other specified devices: Secondary | ICD-10-CM | POA: Diagnosis not present

## 2022-10-15 DIAGNOSIS — N952 Postmenopausal atrophic vaginitis: Secondary | ICD-10-CM | POA: Diagnosis not present

## 2022-10-29 ENCOUNTER — Other Ambulatory Visit (HOSPITAL_BASED_OUTPATIENT_CLINIC_OR_DEPARTMENT_OTHER): Payer: Self-pay

## 2022-10-29 ENCOUNTER — Emergency Department (HOSPITAL_BASED_OUTPATIENT_CLINIC_OR_DEPARTMENT_OTHER): Payer: Medicare Other

## 2022-10-29 ENCOUNTER — Other Ambulatory Visit: Payer: Self-pay

## 2022-10-29 ENCOUNTER — Encounter (HOSPITAL_BASED_OUTPATIENT_CLINIC_OR_DEPARTMENT_OTHER): Payer: Self-pay

## 2022-10-29 ENCOUNTER — Emergency Department (HOSPITAL_BASED_OUTPATIENT_CLINIC_OR_DEPARTMENT_OTHER)
Admission: EM | Admit: 2022-10-29 | Discharge: 2022-10-29 | Disposition: A | Discharge status: Home / Self Care | Payer: Medicare Other | Attending: Emergency Medicine | Admitting: Emergency Medicine

## 2022-10-29 DIAGNOSIS — R1084 Generalized abdominal pain: Secondary | ICD-10-CM | POA: Diagnosis not present

## 2022-10-29 DIAGNOSIS — R1011 Right upper quadrant pain: Secondary | ICD-10-CM | POA: Diagnosis not present

## 2022-10-29 DIAGNOSIS — Z79899 Other long term (current) drug therapy: Secondary | ICD-10-CM | POA: Diagnosis not present

## 2022-10-29 DIAGNOSIS — E860 Dehydration: Secondary | ICD-10-CM | POA: Insufficient documentation

## 2022-10-29 DIAGNOSIS — I700 Atherosclerosis of aorta: Secondary | ICD-10-CM | POA: Diagnosis not present

## 2022-10-29 DIAGNOSIS — Z1152 Encounter for screening for COVID-19: Secondary | ICD-10-CM | POA: Insufficient documentation

## 2022-10-29 DIAGNOSIS — Z91040 Latex allergy status: Secondary | ICD-10-CM | POA: Diagnosis not present

## 2022-10-29 DIAGNOSIS — I10 Essential (primary) hypertension: Secondary | ICD-10-CM | POA: Diagnosis not present

## 2022-10-29 DIAGNOSIS — K5730 Diverticulosis of large intestine without perforation or abscess without bleeding: Secondary | ICD-10-CM | POA: Diagnosis not present

## 2022-10-29 LAB — COMPREHENSIVE METABOLIC PANEL
ALT: 13 U/L (ref 0–44)
AST: 15 U/L (ref 15–41)
Albumin: 4.9 g/dL (ref 3.5–5.0)
Alkaline Phosphatase: 94 U/L (ref 38–126)
Anion gap: 12 (ref 5–15)
BUN: 8 mg/dL (ref 8–23)
CO2: 26 mmol/L (ref 22–32)
Calcium: 10.2 mg/dL (ref 8.9–10.3)
Chloride: 92 mmol/L — ABNORMAL LOW (ref 98–111)
Creatinine, Ser: 0.51 mg/dL (ref 0.44–1.00)
GFR, Estimated: >60 mL/min (ref >60)
Glucose, Bld: 112 mg/dL — ABNORMAL HIGH (ref 70–99)
Potassium: 4 mmol/L (ref 3.5–5.1)
Sodium: 130 mmol/L — ABNORMAL LOW (ref 135–145)
Total Bilirubin: 0.5 mg/dL (ref 0.3–1.2)
Total Protein: 8.5 g/dL — ABNORMAL HIGH (ref 6.5–8.1)

## 2022-10-29 LAB — CBC WITH DIFFERENTIAL/PLATELET
Abs Immature Granulocytes: 0.02 10*3/uL (ref 0.00–0.07)
Basophils Absolute: 0 10*3/uL (ref 0.0–0.1)
Basophils Relative: 1 %
Eosinophils Absolute: 0.1 10*3/uL (ref 0.0–0.5)
Eosinophils Relative: 1 %
HCT: 43.7 % (ref 36.0–46.0)
Hemoglobin: 14.9 g/dL (ref 12.0–15.0)
Immature Granulocytes: 0 %
Lymphocytes Relative: 33 %
Lymphs Abs: 1.8 10*3/uL (ref 0.7–4.0)
MCH: 29.2 pg (ref 26.0–34.0)
MCHC: 34.1 g/dL (ref 30.0–36.0)
MCV: 85.7 fL (ref 80.0–100.0)
Monocytes Absolute: 0.4 10*3/uL (ref 0.1–1.0)
Monocytes Relative: 8 %
Neutro Abs: 3.1 10*3/uL (ref 1.7–7.7)
Neutrophils Relative %: 57 %
Platelets: 360 10*3/uL (ref 150–400)
RBC: 5.1 MIL/uL (ref 3.87–5.11)
RDW: 12.7 % (ref 11.5–15.5)
WBC: 5.4 10*3/uL (ref 4.0–10.5)
nRBC: 0 % (ref 0.0–0.2)

## 2022-10-29 LAB — URINALYSIS, ROUTINE W REFLEX MICROSCOPIC
Bacteria, UA: NONE SEEN
Bilirubin Urine: NEGATIVE
Glucose, UA: NEGATIVE mg/dL
Hgb urine dipstick: NEGATIVE
Nitrite: NEGATIVE
Protein, ur: NEGATIVE mg/dL
Specific Gravity, Urine: 1.005 (ref 1.005–1.030)
pH: 7 (ref 5.0–8.0)

## 2022-10-29 LAB — MAGNESIUM: Magnesium: 1.9 mg/dL (ref 1.7–2.4)

## 2022-10-29 LAB — SARS CORONAVIRUS 2 BY RT PCR: SARS Coronavirus 2 by RT PCR: NEGATIVE

## 2022-10-29 LAB — LIPASE, BLOOD: Lipase: 25 U/L (ref 11–51)

## 2022-10-29 LAB — LACTIC ACID, PLASMA: Lactic Acid, Venous: 0.7 mmol/L (ref 0.5–1.9)

## 2022-10-29 MED ORDER — SODIUM CHLORIDE 0.9 % IV BOLUS
500.0000 mL | Freq: Once | INTRAVENOUS | Status: AC
  Administered 2022-10-29: 500 mL via INTRAVENOUS

## 2022-10-29 NOTE — Discharge Instructions (Addendum)
Hold the Miralax unless you are severely constipated.  Stop taking your antibiotics.

## 2022-10-29 NOTE — ED Triage Notes (Signed)
States it is not pain more of a discomfort.

## 2022-10-29 NOTE — ED Triage Notes (Signed)
States been having "digestive problems" for several days.  States suffer such for years.  Being treated for UTI.  Having constipation.  Right upper quadrant pain

## 2022-10-29 NOTE — ED Notes (Signed)
Pt ambulated to bathroom without issue, standby assist. Pt reported no significant discomfort. Pt did report some facial discomfort and slight balance adjustment r/t neuralgia.

## 2022-10-29 NOTE — ED Notes (Addendum)
(

## 2022-10-29 NOTE — ED Provider Notes (Signed)
[place] [facility]MENT AT [facility] Provider Note   CSN: [id number] Arrival date & time: 10/29/22  1117     History  Chief Complaint  Patient presents with   Abdominal Pain    [name] [name] is a 89 y.o. female.  Pt is a 89 yo female with pmhx significant for htn, hld, shingles, and anxiety.  Pt said she has been having increasing abd pain and discomfort for the past few days.  Her doctor called in an abx for a UTI on 9/8.  She did not get a sample, but was concerned she had one.  She does not feel any better.  She's had trouble with constipation that miralax is not helping.  She has been getting weaker and weaker.  No fevers.  She does have a pessary and is concerned it may need to come out.       Home Medications Prior to Admission medications   Medication Sig Start Date End Date Taking? Authorizing Provider  nitrofurantoin, macrocrystal-monohydrate, (MACROBID) 100 MG capsule Take 100 mg by mouth every 12 (twelve) hours. 10/26/22  Yes [provider]  amLODipine (NORVASC) 10 MG tablet Take 10 mg by mouth at bedtime. 05/14/15   [provider]  BESIVANCE 0.6 % SUSP Place 1 drop into both eyes in the morning, at noon, in the evening, and at bedtime. Starts 2 days after eye injection every 6 weeks 06/28/20   [provider]  Calcium Carbonate (CALCIUM 600 PO) Take 1 tablet by mouth 2 (two) times daily.    [provider]  Coenzyme Q-10 100 MG capsule Take 100 mg by mouth daily.    [provider]  diclofenac Sodium (VOLTAREN) 1 % GEL Apply 4 g topically 4 (four) times daily. 11/07/20   Palumbo, April, MD  estradiol (ESTRACE) 0.1 MG/GM vaginal cream Place 1 Applicatorful vaginally 3 (three) times a week. Helps prevent UTI    [provider]  HYDROcodone-acetaminophen (NORCO/VICODIN) 5-325 MG tablet Take 1-2 tablets by mouth every 4 (four) hours as needed for moderate pain (pain score 4-6). 11/09/20   Barrie Dunker  B, PA-C  lidocaine (LIDODERM) 5 % Place 1 patch onto the skin daily. Remove & Discard patch within 12 hours or as directed by MD 11/07/20   Nicanor Alcon, April, MD  lisinopril (ZESTRIL) 40 MG tablet Take 40 mg by mouth daily.    [provider]  Multiple Vitamin (MULTIVITAMIN WITH MINERALS) TABS Take 1 tablet by mouth daily.    [provider]  Omega-3 1000 MG CAPS Take 1 g by mouth daily.    [provider]  omeprazole (PRILOSEC OTC) 20 MG tablet Take 20 mg by mouth daily.    [provider]  polyethylene glycol (MIRALAX / GLYCOLAX) 17 g packet Take 17 g by mouth daily. 11/11/20   Burnadette Pop, MD  quinapril (ACCUPRIL) 40 MG tablet Take 40 mg by mouth daily.    [provider]  rosuvastatin (CRESTOR) 10 MG tablet Take 10 mg by mouth every other day. 07/17/20   [provider]  saccharomyces boulardii (FLORASTOR) 250 MG capsule Take 1 capsule by mouth every other day.    [provider]  senna (SENOKOT) 8.6 MG TABS tablet Take 1 tablet (8.6 mg total) by mouth 2 (two) times daily. 11/11/20   Burnadette Pop, MD      Allergies    Demerol, Iohexol, Suprax [cefixime], Latex, and Penicillins    Review of Systems   Review of  Systems  Gastrointestinal:  Positive for abdominal pain.  All other systems reviewed and are negative.   Physical Exam Updated Vital Signs BP (!) 166/73   Pulse 82   Temp 98.4 F (36.9 C) (Oral)   Resp 20   Ht 5\' 2"  (1.575 m)   Wt 48.5 kg   SpO2 99%   BMI 19.57 kg/m  Physical Exam Vitals and nursing note reviewed.  Constitutional:      Appearance: She is well-developed.  HENT:     Head: Normocephalic and atraumatic.     Mouth/Throat:     Mouth: Mucous membranes are moist.     Pharynx: Oropharynx is clear.  Eyes:     Extraocular Movements: Extraocular movements intact.     Pupils: Pupils are equal, round, and reactive to light.  Cardiovascular:     Rate and Rhythm: Normal rate and regular rhythm.      Heart sounds: Normal heart sounds.  Pulmonary:     Effort: Pulmonary effort is normal.     Breath sounds: Normal breath sounds.  Abdominal:     General: Abdomen is flat. Bowel sounds are normal.     Palpations: Abdomen is soft.     Tenderness: There is abdominal tenderness in the right upper quadrant and epigastric area.  Skin:    General: Skin is warm.     Capillary Refill: Capillary refill takes less than 2 seconds.  Neurological:     General: No focal deficit present.     Mental Status: She is alert and oriented to person, place, and time.     ED Results / Procedures / Treatments   Labs (all labs ordered are listed, but only abnormal results are displayed) Labs Reviewed  COMPREHENSIVE METABOLIC PANEL - Abnormal; Notable for the following components:      Result Value   Sodium 130 (*)    Chloride 92 (*)    Glucose, Bld 112 (*)    Total Protein 8.5 (*)    All other components within normal limits  URINALYSIS, ROUTINE W REFLEX MICROSCOPIC - Abnormal; Notable for the following components:   Color, Urine COLORLESS (*)    Ketones, ur TRACE (*)    Leukocytes,Ua TRACE (*)    All other components within normal limits  SARS CORONAVIRUS 2 BY RT PCR  CBC WITH DIFFERENTIAL/PLATELET  LIPASE, BLOOD  MAGNESIUM  LACTIC ACID, PLASMA  LACTIC ACID, PLASMA    EKG EKG Interpretation Date/Time:  Wednesday October 29 2022 12:20:41 EDT Ventricular Rate:  75 PR Interval:  157 QRS Duration:  84 QT Interval:  379 QTC Calculation: 424 R Axis:   50  Text Interpretation: Sinus rhythm Prominent P waves, nondiagnostic Abnormal R-wave progression, early transition No significant change since last tracing Confirmed by Jacalyn Lefevre [id number]) on 10/29/2022 12:26:08 PM  Radiology CT ABDOMEN PELVIS WO CONTRAST  Result Date: 10/29/2022 CLINICAL DATA:  89 year old female with abdominal pain. Being treated for UTI. Right upper quadrant pain. EXAM: CT ABDOMEN AND PELVIS WITHOUT CONTRAST TECHNIQUE:  Multidetector CT imaging of the abdomen and pelvis was performed following the standard protocol without IV contrast. RADIATION DOSE REDUCTION: This exam was performed according to the departmental dose-optimization program which includes automated exposure control, adjustment of the mA and/or kV according to patient size and/or use of iterative reconstruction technique. COMPARISON:  CT Abdomen and Pelvis 09/21/2011. FINDINGS: Lower chest: Chronically large lung volumes. No cardiomegaly. No pericardial or pleural effusion. Hepatobiliary: Negative noncontrast liver. Dependent sludge or stones within the gallbladder on  series 2, image 32. No pericholecystic inflammation. Pancreas: Negative. Spleen: Diminutive, negative. Adrenals/Urinary Tract: Negative adrenal glands. Kidneys appear nonobstructed, stable since 2013, negative. No hydroureter. Pelvis streak artifact from right hip arthroplasty. No bladder abnormality is evident. Stomach/Bowel: Large bowel diverticulosis, especially of redundant sigmoid in the pelvis. No active inflammation identified in that segment. Decompressed upstream descending colon. Below average volume of retained stool. No dilated small bowel. Appendix not delineated. No pericecal inflammation identified. Gastric ptosis, stomach decompressed. No free air, free fluid, or mesenteric inflammation identified. Vascular/Lymphatic: Severe Aortoiliac calcified atherosclerosis. Evidence of moderate to severe atherosclerotic stenosis of the infrarenal aorta on series 2, image 25, but Vascular patency is not evaluated in the absence of IV contrast. Normal caliber abdominal aorta. No lymphadenopathy. Reproductive: Pessary.  Otherwise negative noncontrast appearance. Other: No pelvis free fluid. Musculoskeletal: Chronic osteopenia. L3 compression fracture is mild, up to 30% anterior loss of vertebral height. No retropulsion or other complicating features. This was visible on thoracic radiographs last  September, chronic. Right hip arthroplasty.  No acute osseous abnormality identified. IMPRESSION: 1. No acute or inflammatory process identified in the noncontrast abdomen or pelvis. 2. But positive for Severe Aortic Atherosclerosis (ICD10-I70.0), with suspected high-grade stenosis of the infrarenal aorta (series 2, image 25). 3. Large bowel diverticulosis. Gallbladder sludge and/or stones. Osteopenia with chronic L3 compression fracture. Electronically Signed   By: Odessa Fleming M.D.   On: 10/29/2022 13:12    Procedures Procedures    Medications Ordered in ED Medications  sodium chloride 0.9 % bolus 500 mL (0 mLs Intravenous Stopped 10/29/22 1418)    ED Course/ Medical Decision Making/ A&P                                 Medical Decision Making Amount and/or Complexity of Data Reviewed Labs: ordered. Radiology: ordered.   This patient presents to the ED for concern of abd pain, this involves an extensive number of treatment options, and is a complaint that carries with it a high risk of complications and morbidity.  The differential diagnosis includes uti, electrolyte abn, infection   Co morbidities that complicate the patient evaluation  htn, hld, shingles, and anxiety   Additional history obtained:  Additional history obtained from epic chart review External records from outside source obtained and reviewed including daughter   Lab Tests:  I Ordered, and personally interpreted labs.  The pertinent results include:  cbc nl, cmp nl other than na low at 130, covid neg, lip nl, mg nl at 1.9, ua nl, lactic nl   Imaging Studies ordered:  I ordered imaging studies including ct abd/pelvis  I independently visualized and interpreted imaging which showed  No acute or inflammatory process identified in the noncontrast  abdomen or pelvis.    2. But positive for Severe Aortic Atherosclerosis (ICD10-I70.0),  with suspected high-grade stenosis of the infrarenal aorta (series  2, image  25).    3. Large bowel diverticulosis. Gallbladder sludge and/or stones.  Osteopenia with chronic L3 compression fracture.   I agree with the radiologist interpretation   Cardiac Monitoring:  The patient was maintained on a cardiac monitor.  I personally viewed and interpreted the cardiac monitored which showed an underlying rhythm of: nsr   Medicines ordered and prescription drug management:  I ordered medication including ivfs  for sx  Reevaluation of the patient after these medicines showed that the patient improved I have reviewed the patients home  medicines and have made adjustments as needed   Test Considered:  ct   Critical Interventions:  ivfs   Problem List / ED Course:  Abd pain:  there is nothing acute today.  I am going to have pt stop the abx as she does not have a uti.  She is to hold the miralax as she is not constipated.  She is to resume eating fruit which she stopped eating a few days ago.  She is to return if worse  Aortic atherosclerosis:  pt said this is known to her.  Lactic nl, so I don't think there is any mesenteric ischemia going on.    Reevaluation:  After the interventions noted above, I reevaluated the patient and found that they have :improved   Social Determinants of Health:  Lives in independent living   Dispostion:  After consideration of the diagnostic results and the patients response to treatment, I feel that the patent would benefit from discharge with outpatient f/u.          Final Clinical Impression(s) / ED Diagnoses Final diagnoses:  Aortic atherosclerosis (HCC)  Generalized abdominal pain  Dehydration    Rx / DC Orders ED Discharge Orders     None         Jacalyn Lefevre, MD 10/29/22 1501

## 2022-11-03 DIAGNOSIS — R3914 Feeling of incomplete bladder emptying: Secondary | ICD-10-CM | POA: Diagnosis not present

## 2022-11-03 DIAGNOSIS — N958 Other specified menopausal and perimenopausal disorders: Secondary | ICD-10-CM | POA: Diagnosis not present

## 2022-11-03 DIAGNOSIS — Z4689 Encounter for fitting and adjustment of other specified devices: Secondary | ICD-10-CM | POA: Diagnosis not present

## 2022-11-03 DIAGNOSIS — R350 Frequency of micturition: Secondary | ICD-10-CM | POA: Diagnosis not present

## 2022-11-06 DIAGNOSIS — Z4689 Encounter for fitting and adjustment of other specified devices: Secondary | ICD-10-CM | POA: Diagnosis not present

## 2022-11-06 DIAGNOSIS — R350 Frequency of micturition: Secondary | ICD-10-CM | POA: Diagnosis not present

## 2022-11-06 DIAGNOSIS — R3989 Other symptoms and signs involving the genitourinary system: Secondary | ICD-10-CM | POA: Diagnosis not present

## 2022-11-06 DIAGNOSIS — L292 Pruritus vulvae: Secondary | ICD-10-CM | POA: Diagnosis not present

## 2022-11-06 DIAGNOSIS — N958 Other specified menopausal and perimenopausal disorders: Secondary | ICD-10-CM | POA: Diagnosis not present

## 2022-11-19 DIAGNOSIS — Z4689 Encounter for fitting and adjustment of other specified devices: Secondary | ICD-10-CM | POA: Diagnosis not present

## 2022-11-19 DIAGNOSIS — N814 Uterovaginal prolapse, unspecified: Secondary | ICD-10-CM | POA: Diagnosis not present

## 2022-11-19 DIAGNOSIS — N958 Other specified menopausal and perimenopausal disorders: Secondary | ICD-10-CM | POA: Diagnosis not present

## 2022-11-25 DIAGNOSIS — Z23 Encounter for immunization: Secondary | ICD-10-CM | POA: Diagnosis not present

## 2022-11-28 ENCOUNTER — Encounter (INDEPENDENT_AMBULATORY_CARE_PROVIDER_SITE_OTHER): Payer: Medicare Other | Admitting: Ophthalmology

## 2022-11-28 DIAGNOSIS — H353231 Exudative age-related macular degeneration, bilateral, with active choroidal neovascularization: Secondary | ICD-10-CM | POA: Diagnosis not present

## 2022-11-28 DIAGNOSIS — H35033 Hypertensive retinopathy, bilateral: Secondary | ICD-10-CM | POA: Diagnosis not present

## 2022-11-28 DIAGNOSIS — I10 Essential (primary) hypertension: Secondary | ICD-10-CM

## 2022-11-28 DIAGNOSIS — H43813 Vitreous degeneration, bilateral: Secondary | ICD-10-CM | POA: Diagnosis not present

## 2022-12-09 DIAGNOSIS — Z23 Encounter for immunization: Secondary | ICD-10-CM | POA: Diagnosis not present

## 2022-12-31 DIAGNOSIS — H02055 Trichiasis without entropian left lower eyelid: Secondary | ICD-10-CM | POA: Diagnosis not present

## 2022-12-31 DIAGNOSIS — H02052 Trichiasis without entropian right lower eyelid: Secondary | ICD-10-CM | POA: Diagnosis not present

## 2022-12-31 DIAGNOSIS — Z961 Presence of intraocular lens: Secondary | ICD-10-CM | POA: Diagnosis not present

## 2022-12-31 DIAGNOSIS — H52203 Unspecified astigmatism, bilateral: Secondary | ICD-10-CM | POA: Diagnosis not present

## 2023-02-23 DIAGNOSIS — Z4689 Encounter for fitting and adjustment of other specified devices: Secondary | ICD-10-CM | POA: Diagnosis not present

## 2023-02-23 DIAGNOSIS — R350 Frequency of micturition: Secondary | ICD-10-CM | POA: Diagnosis not present

## 2023-02-23 DIAGNOSIS — N958 Other specified menopausal and perimenopausal disorders: Secondary | ICD-10-CM | POA: Diagnosis not present

## 2023-02-23 DIAGNOSIS — R3914 Feeling of incomplete bladder emptying: Secondary | ICD-10-CM | POA: Diagnosis not present

## 2023-02-23 DIAGNOSIS — N814 Uterovaginal prolapse, unspecified: Secondary | ICD-10-CM | POA: Diagnosis not present

## 2023-02-23 DIAGNOSIS — I10 Essential (primary) hypertension: Secondary | ICD-10-CM | POA: Diagnosis not present

## 2023-02-27 ENCOUNTER — Encounter (INDEPENDENT_AMBULATORY_CARE_PROVIDER_SITE_OTHER): Payer: Medicare Other | Admitting: Ophthalmology

## 2023-02-27 DIAGNOSIS — H35033 Hypertensive retinopathy, bilateral: Secondary | ICD-10-CM | POA: Diagnosis not present

## 2023-02-27 DIAGNOSIS — H43813 Vitreous degeneration, bilateral: Secondary | ICD-10-CM | POA: Diagnosis not present

## 2023-02-27 DIAGNOSIS — I10 Essential (primary) hypertension: Secondary | ICD-10-CM | POA: Diagnosis not present

## 2023-02-27 DIAGNOSIS — H353231 Exudative age-related macular degeneration, bilateral, with active choroidal neovascularization: Secondary | ICD-10-CM

## 2023-03-13 DIAGNOSIS — E7800 Pure hypercholesterolemia, unspecified: Secondary | ICD-10-CM | POA: Diagnosis not present

## 2023-03-13 DIAGNOSIS — I10 Essential (primary) hypertension: Secondary | ICD-10-CM | POA: Diagnosis not present

## 2023-03-13 DIAGNOSIS — B0229 Other postherpetic nervous system involvement: Secondary | ICD-10-CM | POA: Diagnosis not present

## 2023-03-13 DIAGNOSIS — K219 Gastro-esophageal reflux disease without esophagitis: Secondary | ICD-10-CM | POA: Diagnosis not present

## 2023-03-13 DIAGNOSIS — H353231 Exudative age-related macular degeneration, bilateral, with active choroidal neovascularization: Secondary | ICD-10-CM | POA: Diagnosis not present

## 2023-03-13 DIAGNOSIS — J3489 Other specified disorders of nose and nasal sinuses: Secondary | ICD-10-CM | POA: Diagnosis not present

## 2023-03-13 DIAGNOSIS — K9089 Other intestinal malabsorption: Secondary | ICD-10-CM | POA: Diagnosis not present

## 2023-03-13 DIAGNOSIS — I700 Atherosclerosis of aorta: Secondary | ICD-10-CM | POA: Diagnosis not present

## 2023-03-19 DIAGNOSIS — S76001A Unspecified injury of muscle, fascia and tendon of right hip, initial encounter: Secondary | ICD-10-CM | POA: Diagnosis not present

## 2023-03-19 DIAGNOSIS — W19XXXA Unspecified fall, initial encounter: Secondary | ICD-10-CM | POA: Diagnosis not present

## 2023-05-19 DIAGNOSIS — I10 Essential (primary) hypertension: Secondary | ICD-10-CM | POA: Diagnosis not present

## 2023-05-19 DIAGNOSIS — N958 Other specified menopausal and perimenopausal disorders: Secondary | ICD-10-CM | POA: Diagnosis not present

## 2023-05-19 DIAGNOSIS — Z4689 Encounter for fitting and adjustment of other specified devices: Secondary | ICD-10-CM | POA: Diagnosis not present

## 2023-05-26 DIAGNOSIS — Z85828 Personal history of other malignant neoplasm of skin: Secondary | ICD-10-CM | POA: Diagnosis not present

## 2023-05-26 DIAGNOSIS — D225 Melanocytic nevi of trunk: Secondary | ICD-10-CM | POA: Diagnosis not present

## 2023-05-26 DIAGNOSIS — D1801 Hemangioma of skin and subcutaneous tissue: Secondary | ICD-10-CM | POA: Diagnosis not present

## 2023-05-26 DIAGNOSIS — L820 Inflamed seborrheic keratosis: Secondary | ICD-10-CM | POA: Diagnosis not present

## 2023-05-26 DIAGNOSIS — L821 Other seborrheic keratosis: Secondary | ICD-10-CM | POA: Diagnosis not present

## 2023-05-26 DIAGNOSIS — D0472 Carcinoma in situ of skin of left lower limb, including hip: Secondary | ICD-10-CM | POA: Diagnosis not present

## 2023-05-26 DIAGNOSIS — C44622 Squamous cell carcinoma of skin of right upper limb, including shoulder: Secondary | ICD-10-CM | POA: Diagnosis not present

## 2023-05-29 ENCOUNTER — Encounter (INDEPENDENT_AMBULATORY_CARE_PROVIDER_SITE_OTHER): Payer: Medicare Other | Admitting: Ophthalmology

## 2023-05-29 DIAGNOSIS — H35033 Hypertensive retinopathy, bilateral: Secondary | ICD-10-CM | POA: Diagnosis not present

## 2023-05-29 DIAGNOSIS — I10 Essential (primary) hypertension: Secondary | ICD-10-CM

## 2023-05-29 DIAGNOSIS — H353231 Exudative age-related macular degeneration, bilateral, with active choroidal neovascularization: Secondary | ICD-10-CM | POA: Diagnosis not present

## 2023-05-29 DIAGNOSIS — H43813 Vitreous degeneration, bilateral: Secondary | ICD-10-CM

## 2023-06-17 DIAGNOSIS — R350 Frequency of micturition: Secondary | ICD-10-CM | POA: Diagnosis not present

## 2023-07-07 DIAGNOSIS — C44722 Squamous cell carcinoma of skin of right lower limb, including hip: Secondary | ICD-10-CM | POA: Diagnosis not present

## 2023-07-07 DIAGNOSIS — Z85828 Personal history of other malignant neoplasm of skin: Secondary | ICD-10-CM | POA: Diagnosis not present

## 2023-07-07 DIAGNOSIS — B0229 Other postherpetic nervous system involvement: Secondary | ICD-10-CM | POA: Diagnosis not present

## 2023-07-07 DIAGNOSIS — B078 Other viral warts: Secondary | ICD-10-CM | POA: Diagnosis not present

## 2023-07-15 DIAGNOSIS — M1711 Unilateral primary osteoarthritis, right knee: Secondary | ICD-10-CM | POA: Diagnosis not present

## 2023-07-28 ENCOUNTER — Other Ambulatory Visit: Payer: Self-pay

## 2023-07-28 ENCOUNTER — Encounter (HOSPITAL_COMMUNITY): Payer: Self-pay

## 2023-07-28 ENCOUNTER — Emergency Department (HOSPITAL_COMMUNITY)

## 2023-07-28 ENCOUNTER — Emergency Department (HOSPITAL_COMMUNITY)
Admission: EM | Admit: 2023-07-28 | Discharge: 2023-07-28 | Disposition: A | Discharge status: Home / Self Care | Attending: Emergency Medicine | Admitting: Emergency Medicine

## 2023-07-28 DIAGNOSIS — S82142A Displaced bicondylar fracture of left tibia, initial encounter for closed fracture: Secondary | ICD-10-CM | POA: Diagnosis not present

## 2023-07-28 DIAGNOSIS — I10 Essential (primary) hypertension: Secondary | ICD-10-CM | POA: Diagnosis not present

## 2023-07-28 DIAGNOSIS — S62614A Displaced fracture of proximal phalanx of right ring finger, initial encounter for closed fracture: Secondary | ICD-10-CM | POA: Insufficient documentation

## 2023-07-28 DIAGNOSIS — Z043 Encounter for examination and observation following other accident: Secondary | ICD-10-CM | POA: Diagnosis not present

## 2023-07-28 DIAGNOSIS — S6991XA Unspecified injury of right wrist, hand and finger(s), initial encounter: Secondary | ICD-10-CM | POA: Diagnosis present

## 2023-07-28 DIAGNOSIS — Z91040 Latex allergy status: Secondary | ICD-10-CM | POA: Diagnosis not present

## 2023-07-28 DIAGNOSIS — S0990XA Unspecified injury of head, initial encounter: Secondary | ICD-10-CM | POA: Insufficient documentation

## 2023-07-28 DIAGNOSIS — Z79899 Other long term (current) drug therapy: Secondary | ICD-10-CM | POA: Diagnosis not present

## 2023-07-28 DIAGNOSIS — S199XXA Unspecified injury of neck, initial encounter: Secondary | ICD-10-CM | POA: Diagnosis not present

## 2023-07-28 DIAGNOSIS — S00211A Abrasion of right eyelid and periocular area, initial encounter: Secondary | ICD-10-CM | POA: Insufficient documentation

## 2023-07-28 DIAGNOSIS — M47812 Spondylosis without myelopathy or radiculopathy, cervical region: Secondary | ICD-10-CM | POA: Diagnosis not present

## 2023-07-28 DIAGNOSIS — Y9289 Other specified places as the place of occurrence of the external cause: Secondary | ICD-10-CM | POA: Insufficient documentation

## 2023-07-28 DIAGNOSIS — M25462 Effusion, left knee: Secondary | ICD-10-CM | POA: Diagnosis not present

## 2023-07-28 DIAGNOSIS — S63289A Dislocation of proximal interphalangeal joint of unspecified finger, initial encounter: Secondary | ICD-10-CM | POA: Diagnosis not present

## 2023-07-28 DIAGNOSIS — M85862 Other specified disorders of bone density and structure, left lower leg: Secondary | ICD-10-CM | POA: Diagnosis not present

## 2023-07-28 DIAGNOSIS — S3993XA Unspecified injury of pelvis, initial encounter: Secondary | ICD-10-CM | POA: Diagnosis not present

## 2023-07-28 DIAGNOSIS — I6782 Cerebral ischemia: Secondary | ICD-10-CM | POA: Diagnosis not present

## 2023-07-28 DIAGNOSIS — R000 Tachycardia, unspecified: Secondary | ICD-10-CM | POA: Diagnosis not present

## 2023-07-28 DIAGNOSIS — Z96641 Presence of right artificial hip joint: Secondary | ICD-10-CM | POA: Diagnosis not present

## 2023-07-28 DIAGNOSIS — Z23 Encounter for immunization: Secondary | ICD-10-CM | POA: Insufficient documentation

## 2023-07-28 DIAGNOSIS — J439 Emphysema, unspecified: Secondary | ICD-10-CM | POA: Diagnosis not present

## 2023-07-28 DIAGNOSIS — R918 Other nonspecific abnormal finding of lung field: Secondary | ICD-10-CM | POA: Diagnosis not present

## 2023-07-28 DIAGNOSIS — M4802 Spinal stenosis, cervical region: Secondary | ICD-10-CM | POA: Diagnosis not present

## 2023-07-28 DIAGNOSIS — S61214A Laceration without foreign body of right ring finger without damage to nail, initial encounter: Secondary | ICD-10-CM | POA: Diagnosis not present

## 2023-07-28 DIAGNOSIS — M25562 Pain in left knee: Secondary | ICD-10-CM | POA: Diagnosis not present

## 2023-07-28 DIAGNOSIS — S61209A Unspecified open wound of unspecified finger without damage to nail, initial encounter: Secondary | ICD-10-CM

## 2023-07-28 DIAGNOSIS — S8992XA Unspecified injury of left lower leg, initial encounter: Secondary | ICD-10-CM | POA: Diagnosis not present

## 2023-07-28 LAB — COMPREHENSIVE METABOLIC PANEL WITH GFR
ALT: 13 U/L (ref 0–44)
AST: 20 U/L (ref 15–41)
Albumin: 4.2 g/dL (ref 3.5–5.0)
Alkaline Phosphatase: 98 U/L (ref 38–126)
Anion gap: 10 (ref 5–15)
BUN: 11 mg/dL (ref 8–23)
CO2: 23 mmol/L (ref 22–32)
Calcium: 9.9 mg/dL (ref 8.9–10.3)
Chloride: 95 mmol/L — ABNORMAL LOW (ref 98–111)
Creatinine, Ser: 0.65 mg/dL (ref 0.44–1.00)
GFR, Estimated: >60 mL/min (ref >60)
Glucose, Bld: 120 mg/dL — ABNORMAL HIGH (ref 70–99)
Potassium: 4.2 mmol/L (ref 3.5–5.1)
Sodium: 128 mmol/L — ABNORMAL LOW (ref 135–145)
Total Bilirubin: 0.6 mg/dL (ref 0.0–1.2)
Total Protein: 7.6 g/dL (ref 6.5–8.1)

## 2023-07-28 LAB — CBC
HCT: 37.1 % (ref 36.0–46.0)
Hemoglobin: 12.8 g/dL (ref 12.0–15.0)
MCH: 29.5 pg (ref 26.0–34.0)
MCHC: 34.5 g/dL (ref 30.0–36.0)
MCV: 85.5 fL (ref 80.0–100.0)
Platelets: 290 10*3/uL (ref 150–400)
RBC: 4.34 MIL/uL (ref 3.87–5.11)
RDW: 13.2 % (ref 11.5–15.5)
WBC: 6.3 10*3/uL (ref 4.0–10.5)
nRBC: 0 % (ref 0.0–0.2)

## 2023-07-28 LAB — I-STAT CHEM 8, ED
BUN: 13 mg/dL (ref 8–23)
Calcium, Ion: 1.16 mmol/L (ref 1.15–1.40)
Chloride: 94 mmol/L — ABNORMAL LOW (ref 98–111)
Creatinine, Ser: 0.8 mg/dL (ref 0.44–1.00)
Glucose, Bld: 121 mg/dL — ABNORMAL HIGH (ref 70–99)
HCT: 40 % (ref 36.0–46.0)
Hemoglobin: 13.6 g/dL (ref 12.0–15.0)
Potassium: 4.4 mmol/L (ref 3.5–5.1)
Sodium: 128 mmol/L — ABNORMAL LOW (ref 135–145)
TCO2: 24 mmol/L (ref 22–32)

## 2023-07-28 LAB — SAMPLE TO BLOOD BANK

## 2023-07-28 LAB — ETHANOL: Alcohol, Ethyl (B): <15 mg/dL (ref <15)

## 2023-07-28 LAB — PROTIME-INR
INR: 0.9 (ref 0.8–1.2)
Prothrombin Time: 12.7 s (ref 11.4–15.2)

## 2023-07-28 LAB — I-STAT CG4 LACTIC ACID, ED: Lactic Acid, Venous: 0.6 mmol/L (ref 0.5–1.9)

## 2023-07-28 MED ORDER — CEFAZOLIN SODIUM-DEXTROSE 2-4 GM/100ML-% IV SOLN: 2.0000 g | Freq: Once | INTRAVENOUS | Status: DC

## 2023-07-28 MED ORDER — CEPHALEXIN 500 MG PO CAPS: 500.0000 mg | ORAL_CAPSULE | Freq: Four times a day (QID) | ORAL | 0 refills | Status: AC

## 2023-07-28 MED ORDER — TETANUS-DIPHTH-ACELL PERTUSSIS 5-2.5-18.5 LF-MCG/0.5 IM SUSY
0.5000 mL | PREFILLED_SYRINGE | Freq: Once | INTRAMUSCULAR | Status: AC
  Administered 2023-07-28: 0.5 mL via INTRAMUSCULAR
  Filled 2023-07-28: qty 0.5

## 2023-07-28 MED ORDER — LIDOCAINE HCL (PF) 1 % IJ SOLN
5.0000 mL | Freq: Once | INTRAMUSCULAR | Status: AC
  Administered 2023-07-28: 5 mL
  Filled 2023-07-28: qty 5

## 2023-07-28 MED ORDER — CLINDAMYCIN PHOSPHATE 900 MG/50ML IV SOLN
900.0000 mg | INTRAVENOUS | Status: AC
  Administered 2023-07-28: 900 mg via INTRAVENOUS
  Filled 2023-07-28: qty 50
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
## 2023-07-28 NOTE — Progress Notes (Signed)
 Orthopedic Tech Progress Note Patient Details:  [name] [name] Apr 05, 1929 [id number]  Ortho Devices Type of Ortho Device: Finger splint Ortho Device/Splint Location: RUE Ortho Device/Splint Interventions: Ordered, Application, Adjustment   Post Interventions Patient Tolerated: Well Instructions Provided: Care of device, Adjustment of device Finger splint applied, unable to apply knee immoblizer at this time due to patient and bedding being soiled. RN made aware. [name] 07/28/2023, 9:51 PM

## 2023-07-28 NOTE — Progress Notes (Signed)
 Orthopedic Tech Progress Note Patient Details:  [name] [name] 1929/05/23 [id number]  Ortho Devices Type of Ortho Device: Finger splint, Knee Immobilizer Ortho Device/Splint Location: RUE Ortho Device/Splint Interventions: Ordered, Application, Adjustment   Post Interventions Patient Tolerated: Well Instructions Provided: Care of device New variation of finger splint applied to provide more stability, knee immobilizer applied to LLE. [name] 07/28/2023, 10:50 PM
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
## 2023-07-30 DIAGNOSIS — R278 Other lack of coordination: Secondary | ICD-10-CM | POA: Diagnosis not present

## 2023-07-30 DIAGNOSIS — R1312 Dysphagia, oropharyngeal phase: Secondary | ICD-10-CM | POA: Diagnosis not present

## 2023-07-30 DIAGNOSIS — M6281 Muscle weakness (generalized): Secondary | ICD-10-CM | POA: Diagnosis not present

## 2023-07-30 DIAGNOSIS — S82142D Displaced bicondylar fracture of left tibia, subsequent encounter for closed fracture with routine healing: Secondary | ICD-10-CM | POA: Diagnosis not present

## 2023-07-30 DIAGNOSIS — R2689 Other abnormalities of gait and mobility: Secondary | ICD-10-CM | POA: Diagnosis not present

## 2023-07-30 DIAGNOSIS — K219 Gastro-esophageal reflux disease without esophagitis: Secondary | ICD-10-CM | POA: Diagnosis not present

## 2023-07-31 DIAGNOSIS — R278 Other lack of coordination: Secondary | ICD-10-CM | POA: Diagnosis not present

## 2023-07-31 DIAGNOSIS — S63284A Dislocation of proximal interphalangeal joint of right ring finger, initial encounter: Secondary | ICD-10-CM | POA: Diagnosis not present

## 2023-07-31 DIAGNOSIS — R2689 Other abnormalities of gait and mobility: Secondary | ICD-10-CM | POA: Diagnosis not present

## 2023-07-31 DIAGNOSIS — M6281 Muscle weakness (generalized): Secondary | ICD-10-CM | POA: Diagnosis not present

## 2023-07-31 DIAGNOSIS — S61204A Unspecified open wound of right ring finger without damage to nail, initial encounter: Secondary | ICD-10-CM | POA: Diagnosis not present

## 2023-07-31 DIAGNOSIS — R1312 Dysphagia, oropharyngeal phase: Secondary | ICD-10-CM | POA: Diagnosis not present

## 2023-07-31 DIAGNOSIS — S82142D Displaced bicondylar fracture of left tibia, subsequent encounter for closed fracture with routine healing: Secondary | ICD-10-CM | POA: Diagnosis not present

## 2023-07-31 DIAGNOSIS — K219 Gastro-esophageal reflux disease without esophagitis: Secondary | ICD-10-CM | POA: Diagnosis not present

## 2023-08-03 DIAGNOSIS — R1312 Dysphagia, oropharyngeal phase: Secondary | ICD-10-CM | POA: Diagnosis not present

## 2023-08-03 DIAGNOSIS — R278 Other lack of coordination: Secondary | ICD-10-CM | POA: Diagnosis not present

## 2023-08-03 DIAGNOSIS — K219 Gastro-esophageal reflux disease without esophagitis: Secondary | ICD-10-CM | POA: Diagnosis not present

## 2023-08-03 DIAGNOSIS — S82142D Displaced bicondylar fracture of left tibia, subsequent encounter for closed fracture with routine healing: Secondary | ICD-10-CM | POA: Diagnosis not present

## 2023-08-03 DIAGNOSIS — M6281 Muscle weakness (generalized): Secondary | ICD-10-CM | POA: Diagnosis not present

## 2023-08-03 DIAGNOSIS — R2689 Other abnormalities of gait and mobility: Secondary | ICD-10-CM | POA: Diagnosis not present

## 2023-08-04 DIAGNOSIS — R278 Other lack of coordination: Secondary | ICD-10-CM | POA: Diagnosis not present

## 2023-08-04 DIAGNOSIS — M6281 Muscle weakness (generalized): Secondary | ICD-10-CM | POA: Diagnosis not present

## 2023-08-04 DIAGNOSIS — K219 Gastro-esophageal reflux disease without esophagitis: Secondary | ICD-10-CM | POA: Diagnosis not present

## 2023-08-04 DIAGNOSIS — S82142D Displaced bicondylar fracture of left tibia, subsequent encounter for closed fracture with routine healing: Secondary | ICD-10-CM | POA: Diagnosis not present

## 2023-08-04 DIAGNOSIS — R1312 Dysphagia, oropharyngeal phase: Secondary | ICD-10-CM | POA: Diagnosis not present

## 2023-08-04 DIAGNOSIS — R2689 Other abnormalities of gait and mobility: Secondary | ICD-10-CM | POA: Diagnosis not present

## 2023-08-05 DIAGNOSIS — S82142D Displaced bicondylar fracture of left tibia, subsequent encounter for closed fracture with routine healing: Secondary | ICD-10-CM | POA: Diagnosis not present

## 2023-08-05 DIAGNOSIS — R278 Other lack of coordination: Secondary | ICD-10-CM | POA: Diagnosis not present

## 2023-08-05 DIAGNOSIS — M6281 Muscle weakness (generalized): Secondary | ICD-10-CM | POA: Diagnosis not present

## 2023-08-05 DIAGNOSIS — K219 Gastro-esophageal reflux disease without esophagitis: Secondary | ICD-10-CM | POA: Diagnosis not present

## 2023-08-05 DIAGNOSIS — R1312 Dysphagia, oropharyngeal phase: Secondary | ICD-10-CM | POA: Diagnosis not present

## 2023-08-05 DIAGNOSIS — R2689 Other abnormalities of gait and mobility: Secondary | ICD-10-CM | POA: Diagnosis not present

## 2023-08-06 DIAGNOSIS — M6281 Muscle weakness (generalized): Secondary | ICD-10-CM | POA: Diagnosis not present

## 2023-08-06 DIAGNOSIS — R2689 Other abnormalities of gait and mobility: Secondary | ICD-10-CM | POA: Diagnosis not present

## 2023-08-06 DIAGNOSIS — K219 Gastro-esophageal reflux disease without esophagitis: Secondary | ICD-10-CM | POA: Diagnosis not present

## 2023-08-06 DIAGNOSIS — R1312 Dysphagia, oropharyngeal phase: Secondary | ICD-10-CM | POA: Diagnosis not present

## 2023-08-06 DIAGNOSIS — S82142D Displaced bicondylar fracture of left tibia, subsequent encounter for closed fracture with routine healing: Secondary | ICD-10-CM | POA: Diagnosis not present

## 2023-08-06 DIAGNOSIS — R278 Other lack of coordination: Secondary | ICD-10-CM | POA: Diagnosis not present

## 2023-08-07 DIAGNOSIS — K219 Gastro-esophageal reflux disease without esophagitis: Secondary | ICD-10-CM | POA: Diagnosis not present

## 2023-08-07 DIAGNOSIS — R278 Other lack of coordination: Secondary | ICD-10-CM | POA: Diagnosis not present

## 2023-08-07 DIAGNOSIS — R2689 Other abnormalities of gait and mobility: Secondary | ICD-10-CM | POA: Diagnosis not present

## 2023-08-07 DIAGNOSIS — S82142D Displaced bicondylar fracture of left tibia, subsequent encounter for closed fracture with routine healing: Secondary | ICD-10-CM | POA: Diagnosis not present

## 2023-08-07 DIAGNOSIS — M6281 Muscle weakness (generalized): Secondary | ICD-10-CM | POA: Diagnosis not present

## 2023-08-07 DIAGNOSIS — R1312 Dysphagia, oropharyngeal phase: Secondary | ICD-10-CM | POA: Diagnosis not present

## 2023-08-08 DIAGNOSIS — S82142D Displaced bicondylar fracture of left tibia, subsequent encounter for closed fracture with routine healing: Secondary | ICD-10-CM | POA: Diagnosis not present

## 2023-08-08 DIAGNOSIS — R2689 Other abnormalities of gait and mobility: Secondary | ICD-10-CM | POA: Diagnosis not present

## 2023-08-08 DIAGNOSIS — R278 Other lack of coordination: Secondary | ICD-10-CM | POA: Diagnosis not present

## 2023-08-08 DIAGNOSIS — M6281 Muscle weakness (generalized): Secondary | ICD-10-CM | POA: Diagnosis not present

## 2023-08-08 DIAGNOSIS — R1312 Dysphagia, oropharyngeal phase: Secondary | ICD-10-CM | POA: Diagnosis not present

## 2023-08-08 DIAGNOSIS — K219 Gastro-esophageal reflux disease without esophagitis: Secondary | ICD-10-CM | POA: Diagnosis not present

## 2023-08-10 DIAGNOSIS — K219 Gastro-esophageal reflux disease without esophagitis: Secondary | ICD-10-CM | POA: Diagnosis not present

## 2023-08-10 DIAGNOSIS — R2689 Other abnormalities of gait and mobility: Secondary | ICD-10-CM | POA: Diagnosis not present

## 2023-08-10 DIAGNOSIS — M6281 Muscle weakness (generalized): Secondary | ICD-10-CM | POA: Diagnosis not present

## 2023-08-10 DIAGNOSIS — S82142D Displaced bicondylar fracture of left tibia, subsequent encounter for closed fracture with routine healing: Secondary | ICD-10-CM | POA: Diagnosis not present

## 2023-08-10 DIAGNOSIS — R278 Other lack of coordination: Secondary | ICD-10-CM | POA: Diagnosis not present

## 2023-08-10 DIAGNOSIS — R1312 Dysphagia, oropharyngeal phase: Secondary | ICD-10-CM | POA: Diagnosis not present

## 2023-08-11 DIAGNOSIS — R2689 Other abnormalities of gait and mobility: Secondary | ICD-10-CM | POA: Diagnosis not present

## 2023-08-11 DIAGNOSIS — S82142D Displaced bicondylar fracture of left tibia, subsequent encounter for closed fracture with routine healing: Secondary | ICD-10-CM | POA: Diagnosis not present

## 2023-08-11 DIAGNOSIS — M6281 Muscle weakness (generalized): Secondary | ICD-10-CM | POA: Diagnosis not present

## 2023-08-11 DIAGNOSIS — R1312 Dysphagia, oropharyngeal phase: Secondary | ICD-10-CM | POA: Diagnosis not present

## 2023-08-11 DIAGNOSIS — R278 Other lack of coordination: Secondary | ICD-10-CM | POA: Diagnosis not present

## 2023-08-11 DIAGNOSIS — K219 Gastro-esophageal reflux disease without esophagitis: Secondary | ICD-10-CM | POA: Diagnosis not present

## 2023-08-12 DIAGNOSIS — R1312 Dysphagia, oropharyngeal phase: Secondary | ICD-10-CM | POA: Diagnosis not present

## 2023-08-12 DIAGNOSIS — M6281 Muscle weakness (generalized): Secondary | ICD-10-CM | POA: Diagnosis not present

## 2023-08-12 DIAGNOSIS — N8110 Cystocele, unspecified: Secondary | ICD-10-CM | POA: Diagnosis not present

## 2023-08-12 DIAGNOSIS — R2689 Other abnormalities of gait and mobility: Secondary | ICD-10-CM | POA: Diagnosis not present

## 2023-08-12 DIAGNOSIS — Z960 Presence of urogenital implants: Secondary | ICD-10-CM | POA: Diagnosis not present

## 2023-08-12 DIAGNOSIS — S82142D Displaced bicondylar fracture of left tibia, subsequent encounter for closed fracture with routine healing: Secondary | ICD-10-CM | POA: Diagnosis not present

## 2023-08-12 DIAGNOSIS — K219 Gastro-esophageal reflux disease without esophagitis: Secondary | ICD-10-CM | POA: Diagnosis not present

## 2023-08-12 DIAGNOSIS — R8290 Unspecified abnormal findings in urine: Secondary | ICD-10-CM | POA: Diagnosis not present

## 2023-08-12 DIAGNOSIS — R278 Other lack of coordination: Secondary | ICD-10-CM | POA: Diagnosis not present

## 2023-08-13 DIAGNOSIS — S82142D Displaced bicondylar fracture of left tibia, subsequent encounter for closed fracture with routine healing: Secondary | ICD-10-CM | POA: Diagnosis not present

## 2023-08-13 DIAGNOSIS — R278 Other lack of coordination: Secondary | ICD-10-CM | POA: Diagnosis not present

## 2023-08-13 DIAGNOSIS — R1312 Dysphagia, oropharyngeal phase: Secondary | ICD-10-CM | POA: Diagnosis not present

## 2023-08-13 DIAGNOSIS — K219 Gastro-esophageal reflux disease without esophagitis: Secondary | ICD-10-CM | POA: Diagnosis not present

## 2023-08-13 DIAGNOSIS — R2689 Other abnormalities of gait and mobility: Secondary | ICD-10-CM | POA: Diagnosis not present

## 2023-08-13 DIAGNOSIS — M6281 Muscle weakness (generalized): Secondary | ICD-10-CM | POA: Diagnosis not present

## 2023-08-14 DIAGNOSIS — R1312 Dysphagia, oropharyngeal phase: Secondary | ICD-10-CM | POA: Diagnosis not present

## 2023-08-14 DIAGNOSIS — R2689 Other abnormalities of gait and mobility: Secondary | ICD-10-CM | POA: Diagnosis not present

## 2023-08-14 DIAGNOSIS — S82142D Displaced bicondylar fracture of left tibia, subsequent encounter for closed fracture with routine healing: Secondary | ICD-10-CM | POA: Diagnosis not present

## 2023-08-14 DIAGNOSIS — R278 Other lack of coordination: Secondary | ICD-10-CM | POA: Diagnosis not present

## 2023-08-14 DIAGNOSIS — M6281 Muscle weakness (generalized): Secondary | ICD-10-CM | POA: Diagnosis not present

## 2023-08-14 DIAGNOSIS — K219 Gastro-esophageal reflux disease without esophagitis: Secondary | ICD-10-CM | POA: Diagnosis not present

## 2023-08-17 ENCOUNTER — Encounter (HOSPITAL_COMMUNITY): Payer: Self-pay

## 2023-08-17 ENCOUNTER — Emergency Department (HOSPITAL_COMMUNITY)
Admission: EM | Admit: 2023-08-17 | Discharge: 2023-08-17 | Disposition: A | Discharge status: Home / Self Care | Source: Skilled Nursing Facility | Attending: Emergency Medicine | Admitting: Emergency Medicine

## 2023-08-17 ENCOUNTER — Other Ambulatory Visit: Payer: Self-pay

## 2023-08-17 DIAGNOSIS — R1030 Lower abdominal pain, unspecified: Secondary | ICD-10-CM | POA: Diagnosis not present

## 2023-08-17 DIAGNOSIS — Z91040 Latex allergy status: Secondary | ICD-10-CM | POA: Insufficient documentation

## 2023-08-17 DIAGNOSIS — K5900 Constipation, unspecified: Secondary | ICD-10-CM | POA: Diagnosis not present

## 2023-08-17 DIAGNOSIS — K5289 Other specified noninfective gastroenteritis and colitis: Secondary | ICD-10-CM

## 2023-08-17 DIAGNOSIS — Z79899 Other long term (current) drug therapy: Secondary | ICD-10-CM | POA: Insufficient documentation

## 2023-08-17 DIAGNOSIS — K529 Noninfective gastroenteritis and colitis, unspecified: Secondary | ICD-10-CM | POA: Diagnosis not present

## 2023-08-17 DIAGNOSIS — R197 Diarrhea, unspecified: Secondary | ICD-10-CM | POA: Diagnosis not present

## 2023-08-17 DIAGNOSIS — I10 Essential (primary) hypertension: Secondary | ICD-10-CM | POA: Diagnosis not present

## 2023-08-17 DIAGNOSIS — R1084 Generalized abdominal pain: Secondary | ICD-10-CM | POA: Diagnosis not present

## 2023-08-17 LAB — COMPREHENSIVE METABOLIC PANEL WITH GFR
ALT: 23 U/L (ref 0–44)
AST: 25 U/L (ref 15–41)
Albumin: 4.2 g/dL (ref 3.5–5.0)
Alkaline Phosphatase: 167 U/L — ABNORMAL HIGH (ref 38–126)
Anion gap: 12 (ref 5–15)
BUN: 6 mg/dL — ABNORMAL LOW (ref 8–23)
CO2: 24 mmol/L (ref 22–32)
Calcium: 10.1 mg/dL (ref 8.9–10.3)
Chloride: 92 mmol/L — ABNORMAL LOW (ref 98–111)
Creatinine, Ser: 0.45 mg/dL (ref 0.44–1.00)
GFR, Estimated: >60 mL/min (ref >60)
Glucose, Bld: 112 mg/dL — ABNORMAL HIGH (ref 70–99)
Potassium: 4.5 mmol/L (ref 3.5–5.1)
Sodium: 128 mmol/L — ABNORMAL LOW (ref 135–145)
Total Bilirubin: 0.6 mg/dL (ref 0.0–1.2)
Total Protein: 8.1 g/dL (ref 6.5–8.1)

## 2023-08-17 LAB — CBC
HCT: 39.1 % (ref 36.0–46.0)
Hemoglobin: 13 g/dL (ref 12.0–15.0)
MCH: 29.1 pg (ref 26.0–34.0)
MCHC: 33.2 g/dL (ref 30.0–36.0)
MCV: 87.7 fL (ref 80.0–100.0)
Platelets: 509 10*3/uL — ABNORMAL HIGH (ref 150–400)
RBC: 4.46 MIL/uL (ref 3.87–5.11)
RDW: 13.4 % (ref 11.5–15.5)
WBC: 6 10*3/uL (ref 4.0–10.5)
nRBC: 0 % (ref 0.0–0.2)

## 2023-08-17 LAB — LIPASE, BLOOD: Lipase: 38 U/L (ref 11–51)

## 2023-08-17 MED ORDER — SODIUM CHLORIDE 0.9 % IV BOLUS
500.0000 mL | Freq: Once | INTRAVENOUS | Status: AC
  Administered 2023-08-17: 500 mL via INTRAVENOUS

## 2023-08-17 NOTE — ED Notes (Signed)
 Patient facility called for update, [name] [name] given update on patient's status and plan of care. Patient made aware of update.

## 2023-08-17 NOTE — ED Provider Triage Note (Signed)
 Emergency Medicine Provider Triage Evaluation Note  [name] [name] , a 89 y.o. female  was evaluated in triage.  Pt complains of previous constipation that was managed w/ multiple laxatives; now presents w/ multiple loose stools.  Most concerned w/ lower abd pain.  Further has concerns of dehydration, no dizziness.  Review of Systems  Positive: Abd pain Negative: dizziness  Physical Exam  BP (!) 143/68 (BP Location: Right Arm)   Pulse 80   Temp 98.2 F (36.8 C) (Oral)   Resp 14   Ht 5' 2 (1.575 m)   Wt 49 kg   SpO2 99%   BMI 19.76 kg/m  Gen:   Awake, no distress   Resp:  Normal effort  MSK:   Moves extremities without difficulty  Other:    Medical Decision Making  Medically screening exam initiated at 12:06 PM.  Appropriate orders placed.  [name] [name] was informed that the remainder of the evaluation will be completed by another provider, this initial triage assessment does not replace that evaluation, and the importance of remaining in the ED until their evaluation is complete.     [name] [name], [name] [name] 08/17/23 [id number]

## 2023-08-17 NOTE — Discharge Instructions (Signed)
 For the duration of the evening and through tomorrow, drink lots of electrolyte solutions, either Pedialyte or Gatorade, along with clear liquids.  Advance your diet as you can tolerate it, beginning with easy to digest foods.  Get plenty of rest over the next day, and follow-up with your primary care provider within the next week to 2 weeks.
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
## 2023-08-17 NOTE — ED Provider Notes (Signed)
 [place] [facility]GENC[facility] DEPARTMENT AT [facility] Provider Note   CSN: [id number] Arrival date & time: 08/17/23  1032     Patient presents with: Diarrhea   [name] [name] is a 89 y.o. female who presents to the ED today with multiple loose stools.  This is following her being given multiple laxatives including MiraLAX , milk of magnesia, and suppository along with attempted digital disimpaction of the rectum.  This all occurred at her senior living facility, and she was sent here to the hospital for further evaluation of lower abdominal pain secondary to multiple loose stools after heavy laxative use.  She presents with discomfort to the lower abdomen, however states that she does not have any further loose stools or nausea/vomiting.  It is notable this patient has a history of hypertension, and hyperlipidemia.    Diarrhea Associated symptoms: abdominal pain        Prior to Admission medications   Medication Sig Start Date End Date Taking? Authorizing Provider  acetaminophen  (TYLENOL ) 500 MG tablet Take 1,000 mg by mouth every 6 (six) hours as needed.    [provider]  amLODipine  (NORVASC ) 10 MG tablet Take 10 mg by mouth at bedtime. 05/14/15   [provider]  BESIVANCE 0.6 % SUSP Place 1 drop into both eyes in the morning, at noon, in the evening, and at bedtime. Starts 2 days after eye injection every 6 weeks 06/28/20   [provider]  Calcium  Carbonate (CALCIUM  600 PO) Take 1 tablet by mouth 2 (two) times daily.    [provider]  cephALEXin  (KEFLEX ) 500 MG capsule Take 1 capsule (500 mg total) by mouth 4 (four) times daily. 07/28/23   Patsey Lot, MD  Cholecalciferol 25 MCG (1000 UT) CHEW Chew 1,000 Units by mouth 2 (two) times a week. Take on Mon and Wed 12/23/11   [provider]  Coenzyme Q-10 100 MG capsule Take 100 mg by mouth daily.    [provider]  cyanocobalamin  (VITAMIN B12) 500 MCG tablet Take 500 mcg  by mouth 2 (two) times a week. Takes on Mon and Fri    [provider]  estradiol  (ESTRACE ) 0.1 MG/GM vaginal cream Place 1 Applicatorful vaginally 3 (three) times a week. Helps prevent UTI    [provider]  hypromellose (GENTEAL) 0.3 % GEL ophthalmic ointment Place 1 Application into both eyes at bedtime.    [provider]  lisinopril  (ZESTRIL ) 40 MG tablet Take 40 mg by mouth daily.    [provider]  meloxicam (MOBIC) 7.5 MG tablet Take 7.5 mg by mouth daily.    [provider]  mometasone (NASONEX) 50 MCG/ACT nasal spray Place 2 sprays into the nose daily.    [provider]  Multiple Vitamin (MULTIVITAMIN WITH MINERALS) TABS Take 1 tablet by mouth every other day.    [provider]  Omega-3 1000 MG CAPS Take 1 g by mouth daily.    [provider]  omeprazole  (PRILOSEC  OTC) 20 MG tablet Take 20 mg by mouth daily.    [provider]  Polyethyl Glycol-Propyl Glycol (SYSTANE ULTRA) 0.4-0.3 % SOLN Place 1 drop into both eyes in the morning.    [provider]  polyethylene glycol (MIRALAX  / GLYCOLAX ) 17 g packet Take 17 g by mouth daily. 11/11/20   Jillian Buttery, MD  rosuvastatin  (CRESTOR ) 10 MG tablet Take 10 mg by mouth every other day. 07/17/20   [provider]  saccharomyces boulardii (FLORASTOR) 250  MG capsule Take 1 capsule by mouth every other day.    [provider]    Allergies: Gabapentin, Hyoscyamine, Demerol, Iodine , Iohexol, Oxycodone, Suprax [cefixime], Latex, and Penicillins    Review of Systems  Gastrointestinal:  Positive for abdominal pain and diarrhea.  All other systems reviewed and are negative.   Updated Vital Signs BP (!) 143/68 (BP Location: Right Arm)   Pulse 80   Temp 98.2 F (36.8 C) (Oral)   Resp 14   Ht 5' 2 (1.575 m)   Wt 49 kg   SpO2 99%   BMI 19.76 kg/m   Physical Exam Vitals and nursing note reviewed.  Constitutional:      General:  She is not in acute distress.    Appearance: Normal appearance.  HENT:     Head: Normocephalic and atraumatic.     Mouth/Throat:     Mouth: Mucous membranes are moist.     Pharynx: Oropharynx is clear.   Eyes:     Extraocular Movements: Extraocular movements intact.     Conjunctiva/sclera: Conjunctivae normal.     Pupils: Pupils are equal, round, and reactive to light.    Cardiovascular:     Rate and Rhythm: Normal rate and regular rhythm.     Pulses: Normal pulses.     Heart sounds: Normal heart sounds. No murmur heard.    No friction rub. No gallop.  Pulmonary:     Effort: Pulmonary effort is normal.     Breath sounds: Normal breath sounds.  Abdominal:     General: Abdomen is flat. Bowel sounds are normal.     Palpations: Abdomen is soft.     Tenderness: There is abdominal tenderness in the right lower quadrant and left lower quadrant.     Comments: Mild tenderness to palpation of the lower quadrants, normal present bowel sounds are noted.   Musculoskeletal:        General: Normal range of motion.     Cervical back: Normal range of motion and neck supple.     Right lower leg: No edema.     Left lower leg: No edema.   Skin:    General: Skin is warm and dry.     Capillary Refill: Capillary refill takes less than 2 seconds.   Neurological:     General: No focal deficit present.     Mental Status: She is alert. Mental status is at baseline.   Psychiatric:        Mood and Affect: Mood normal.     (all labs ordered are listed, but only abnormal results are displayed) Labs Reviewed  COMPREHENSIVE METABOLIC PANEL WITH GFR - Abnormal; Notable for the following components:      Result Value   Sodium 128 (*)    Chloride 92 (*)    Glucose, Bld 112 (*)    BUN 6 (*)    Alkaline Phosphatase 167 (*)    All other components within normal limits  CBC - Abnormal; Notable for the following components:   Platelets 509 (*)    All other components within normal limits  LIPASE,  BLOOD  URINALYSIS, ROUTINE W REFLEX MICROSCOPIC    EKG: None  Radiology: No results found.   Procedures   Medications Ordered in the ED  sodium chloride  0.9 % bolus 500 mL (has no administration in time range)  Medical Decision Making Amount and/or Complexity of Data Reviewed Labs: ordered.   Medical Decision Making:   MIKKA KISSNER is a 89 y.o. female who presented to the ED today with lower abdominal discomfort detailed above.     Complete initial physical exam performed, notably the patient  was alert and oriented in no apparent distress.  Physical exam as noted, remarkably her abdomen is mostly nontender except for mild tenderness to the pelvic region, denies any dysuria or any other urinary symptoms at this time.    Reviewed and confirmed nursing documentation for past medical history, family history, social history.    Initial Assessment:   With the patient's presentation of lower abdominal discomfort, most likely diagnosis is laxative overuse. Other diagnoses were considered including (but not limited to) infectious colitis.   Initial Plan:    Screening labs including CBC and Metabolic panel to evaluate for infectious or metabolic etiology of disease.  Based on evaluation of this patient, imaging is deferred at this time as there appears to be low risk for bowel obstruction or other intra-abdominal pathology. Objective evaluation as below reviewed   Initial Study Results:   Laboratory  All laboratory results reviewed without evidence of clinically relevant pathology.   Exceptions include: Sodium is noted at 128, elevated alkaline phosphatase of 167.  Thrombocytosis of 509.   Reassessment and Plan:   On assessment this patient, she presents as stable and having lower abdominal discomfort that is most likely due to laxative overuse.  Lab evaluation this patient did demonstrate a mild hyponatremia which has been managed initially  with a 500 cc bolus of normal saline.  Will refer this patient for follow-up with primary care to ensure that she returns to baseline, believe with normal nutrition and hydration this will correct.  I reevaluation the patient no longer has any lower abdominal discomfort, and states that she is subjectively improved.  Based on full lab assessment, along with patient's status, believe this patient to be stable for discharge.  At this time we will discharge patient to return home with her family to her senior living center.  Recommend follow-up with primary care within the next 1 to 2 weeks for evaluation of labs to ensure that sodium returns to normal.       Final diagnoses:  None    ED Discharge Orders     None          Myriam Dorn BROCKS, PA 08/17/23 1453    Geraldene Hamilton, MD 08/20/23 1626

## 2023-08-17 NOTE — ED Triage Notes (Addendum)
 Pt coming in from [facility]. Pt complaining of diarrhea that started after she was given mira lax, milk of magnesia and a suppository for constipation. Pt complaining of lower abdominal pain .   166/70 Pulse 80 Rr18  97% on ra

## 2023-08-18 DIAGNOSIS — M6281 Muscle weakness (generalized): Secondary | ICD-10-CM | POA: Diagnosis not present

## 2023-08-18 DIAGNOSIS — R278 Other lack of coordination: Secondary | ICD-10-CM | POA: Diagnosis not present

## 2023-08-18 DIAGNOSIS — R2689 Other abnormalities of gait and mobility: Secondary | ICD-10-CM | POA: Diagnosis not present

## 2023-08-18 DIAGNOSIS — R2681 Unsteadiness on feet: Secondary | ICD-10-CM | POA: Diagnosis not present

## 2023-08-18 DIAGNOSIS — S82142D Displaced bicondylar fracture of left tibia, subsequent encounter for closed fracture with routine healing: Secondary | ICD-10-CM | POA: Diagnosis not present

## 2023-08-19 ENCOUNTER — Other Ambulatory Visit (HOSPITAL_COMMUNITY): Payer: Self-pay | Admitting: Medical

## 2023-08-19 ENCOUNTER — Ambulatory Visit (HOSPITAL_COMMUNITY)
Admission: RE | Admit: 2023-08-19 | Discharge: 2023-08-19 | Disposition: A | Discharge status: Home / Self Care | Source: Ambulatory Visit | Attending: Vascular Surgery | Admitting: Vascular Surgery

## 2023-08-19 DIAGNOSIS — M79662 Pain in left lower leg: Secondary | ICD-10-CM | POA: Diagnosis not present

## 2023-08-19 DIAGNOSIS — R2681 Unsteadiness on feet: Secondary | ICD-10-CM | POA: Diagnosis not present

## 2023-08-19 DIAGNOSIS — M6281 Muscle weakness (generalized): Secondary | ICD-10-CM | POA: Diagnosis not present

## 2023-08-19 DIAGNOSIS — R278 Other lack of coordination: Secondary | ICD-10-CM | POA: Diagnosis not present

## 2023-08-19 DIAGNOSIS — M25562 Pain in left knee: Secondary | ICD-10-CM | POA: Diagnosis not present

## 2023-08-19 DIAGNOSIS — S82142D Displaced bicondylar fracture of left tibia, subsequent encounter for closed fracture with routine healing: Secondary | ICD-10-CM | POA: Diagnosis not present

## 2023-08-19 DIAGNOSIS — R2689 Other abnormalities of gait and mobility: Secondary | ICD-10-CM | POA: Diagnosis not present

## 2023-08-20 DIAGNOSIS — S63284A Dislocation of proximal interphalangeal joint of right ring finger, initial encounter: Secondary | ICD-10-CM | POA: Diagnosis not present

## 2023-08-20 DIAGNOSIS — R278 Other lack of coordination: Secondary | ICD-10-CM | POA: Diagnosis not present

## 2023-08-20 DIAGNOSIS — S61204A Unspecified open wound of right ring finger without damage to nail, initial encounter: Secondary | ICD-10-CM | POA: Diagnosis not present

## 2023-08-20 DIAGNOSIS — R2689 Other abnormalities of gait and mobility: Secondary | ICD-10-CM | POA: Diagnosis not present

## 2023-08-20 DIAGNOSIS — S82142D Displaced bicondylar fracture of left tibia, subsequent encounter for closed fracture with routine healing: Secondary | ICD-10-CM | POA: Diagnosis not present

## 2023-08-20 DIAGNOSIS — R2681 Unsteadiness on feet: Secondary | ICD-10-CM | POA: Diagnosis not present

## 2023-08-20 DIAGNOSIS — M6281 Muscle weakness (generalized): Secondary | ICD-10-CM | POA: Diagnosis not present

## 2023-08-23 DIAGNOSIS — R278 Other lack of coordination: Secondary | ICD-10-CM | POA: Diagnosis not present

## 2023-08-23 DIAGNOSIS — R2681 Unsteadiness on feet: Secondary | ICD-10-CM | POA: Diagnosis not present

## 2023-08-23 DIAGNOSIS — R2689 Other abnormalities of gait and mobility: Secondary | ICD-10-CM | POA: Diagnosis not present

## 2023-08-23 DIAGNOSIS — M6281 Muscle weakness (generalized): Secondary | ICD-10-CM | POA: Diagnosis not present

## 2023-08-23 DIAGNOSIS — S82142D Displaced bicondylar fracture of left tibia, subsequent encounter for closed fracture with routine healing: Secondary | ICD-10-CM | POA: Diagnosis not present

## 2023-08-24 DIAGNOSIS — R278 Other lack of coordination: Secondary | ICD-10-CM | POA: Diagnosis not present

## 2023-08-24 DIAGNOSIS — R2689 Other abnormalities of gait and mobility: Secondary | ICD-10-CM | POA: Diagnosis not present

## 2023-08-24 DIAGNOSIS — M6281 Muscle weakness (generalized): Secondary | ICD-10-CM | POA: Diagnosis not present

## 2023-08-24 DIAGNOSIS — R2681 Unsteadiness on feet: Secondary | ICD-10-CM | POA: Diagnosis not present

## 2023-08-24 DIAGNOSIS — S82142D Displaced bicondylar fracture of left tibia, subsequent encounter for closed fracture with routine healing: Secondary | ICD-10-CM | POA: Diagnosis not present

## 2023-08-25 DIAGNOSIS — R278 Other lack of coordination: Secondary | ICD-10-CM | POA: Diagnosis not present

## 2023-08-25 DIAGNOSIS — S82142D Displaced bicondylar fracture of left tibia, subsequent encounter for closed fracture with routine healing: Secondary | ICD-10-CM | POA: Diagnosis not present

## 2023-08-25 DIAGNOSIS — R2689 Other abnormalities of gait and mobility: Secondary | ICD-10-CM | POA: Diagnosis not present

## 2023-08-25 DIAGNOSIS — R2681 Unsteadiness on feet: Secondary | ICD-10-CM | POA: Diagnosis not present

## 2023-08-25 DIAGNOSIS — M6281 Muscle weakness (generalized): Secondary | ICD-10-CM | POA: Diagnosis not present

## 2023-08-26 DIAGNOSIS — R2681 Unsteadiness on feet: Secondary | ICD-10-CM | POA: Diagnosis not present

## 2023-08-26 DIAGNOSIS — R2689 Other abnormalities of gait and mobility: Secondary | ICD-10-CM | POA: Diagnosis not present

## 2023-08-26 DIAGNOSIS — R278 Other lack of coordination: Secondary | ICD-10-CM | POA: Diagnosis not present

## 2023-08-26 DIAGNOSIS — S82142D Displaced bicondylar fracture of left tibia, subsequent encounter for closed fracture with routine healing: Secondary | ICD-10-CM | POA: Diagnosis not present

## 2023-08-26 DIAGNOSIS — M6281 Muscle weakness (generalized): Secondary | ICD-10-CM | POA: Diagnosis not present

## 2023-08-27 ENCOUNTER — Encounter (INDEPENDENT_AMBULATORY_CARE_PROVIDER_SITE_OTHER): Admitting: Ophthalmology

## 2023-08-31 DIAGNOSIS — M6281 Muscle weakness (generalized): Secondary | ICD-10-CM | POA: Diagnosis not present

## 2023-08-31 DIAGNOSIS — R2681 Unsteadiness on feet: Secondary | ICD-10-CM | POA: Diagnosis not present

## 2023-08-31 DIAGNOSIS — R2689 Other abnormalities of gait and mobility: Secondary | ICD-10-CM | POA: Diagnosis not present

## 2023-08-31 DIAGNOSIS — R278 Other lack of coordination: Secondary | ICD-10-CM | POA: Diagnosis not present

## 2023-08-31 DIAGNOSIS — S82142D Displaced bicondylar fracture of left tibia, subsequent encounter for closed fracture with routine healing: Secondary | ICD-10-CM | POA: Diagnosis not present

## 2023-09-01 DIAGNOSIS — N958 Other specified menopausal and perimenopausal disorders: Secondary | ICD-10-CM | POA: Diagnosis not present

## 2023-09-01 DIAGNOSIS — N898 Other specified noninflammatory disorders of vagina: Secondary | ICD-10-CM | POA: Diagnosis not present

## 2023-09-01 DIAGNOSIS — T8389XA Other specified complication of genitourinary prosthetic devices, implants and grafts, initial encounter: Secondary | ICD-10-CM | POA: Diagnosis not present

## 2023-09-09 DIAGNOSIS — S61204A Unspecified open wound of right ring finger without damage to nail, initial encounter: Secondary | ICD-10-CM | POA: Diagnosis not present

## 2023-09-09 DIAGNOSIS — S63284A Dislocation of proximal interphalangeal joint of right ring finger, initial encounter: Secondary | ICD-10-CM | POA: Diagnosis not present

## 2023-09-09 DIAGNOSIS — S82142D Displaced bicondylar fracture of left tibia, subsequent encounter for closed fracture with routine healing: Secondary | ICD-10-CM | POA: Diagnosis not present

## 2023-09-09 DIAGNOSIS — M79662 Pain in left lower leg: Secondary | ICD-10-CM | POA: Diagnosis not present

## 2023-09-15 DIAGNOSIS — R278 Other lack of coordination: Secondary | ICD-10-CM | POA: Diagnosis not present

## 2023-09-15 DIAGNOSIS — M6281 Muscle weakness (generalized): Secondary | ICD-10-CM | POA: Diagnosis not present

## 2023-09-15 DIAGNOSIS — R2689 Other abnormalities of gait and mobility: Secondary | ICD-10-CM | POA: Diagnosis not present

## 2023-09-15 DIAGNOSIS — R2681 Unsteadiness on feet: Secondary | ICD-10-CM | POA: Diagnosis not present

## 2023-09-15 DIAGNOSIS — S82142D Displaced bicondylar fracture of left tibia, subsequent encounter for closed fracture with routine healing: Secondary | ICD-10-CM | POA: Diagnosis not present

## 2023-09-18 DIAGNOSIS — M6281 Muscle weakness (generalized): Secondary | ICD-10-CM | POA: Diagnosis not present

## 2023-09-18 DIAGNOSIS — R2689 Other abnormalities of gait and mobility: Secondary | ICD-10-CM | POA: Diagnosis not present

## 2023-09-18 DIAGNOSIS — S82142D Displaced bicondylar fracture of left tibia, subsequent encounter for closed fracture with routine healing: Secondary | ICD-10-CM | POA: Diagnosis not present

## 2023-09-21 DIAGNOSIS — M6281 Muscle weakness (generalized): Secondary | ICD-10-CM | POA: Diagnosis not present

## 2023-09-21 DIAGNOSIS — S82142D Displaced bicondylar fracture of left tibia, subsequent encounter for closed fracture with routine healing: Secondary | ICD-10-CM | POA: Diagnosis not present

## 2023-09-21 DIAGNOSIS — R2689 Other abnormalities of gait and mobility: Secondary | ICD-10-CM | POA: Diagnosis not present

## 2023-09-22 ENCOUNTER — Encounter (INDEPENDENT_AMBULATORY_CARE_PROVIDER_SITE_OTHER): Admitting: Ophthalmology

## 2023-09-22 DIAGNOSIS — H43813 Vitreous degeneration, bilateral: Secondary | ICD-10-CM | POA: Diagnosis not present

## 2023-09-22 DIAGNOSIS — H35033 Hypertensive retinopathy, bilateral: Secondary | ICD-10-CM | POA: Diagnosis not present

## 2023-09-22 DIAGNOSIS — H353231 Exudative age-related macular degeneration, bilateral, with active choroidal neovascularization: Secondary | ICD-10-CM | POA: Diagnosis not present

## 2023-09-22 DIAGNOSIS — I10 Essential (primary) hypertension: Secondary | ICD-10-CM | POA: Diagnosis not present

## 2023-09-23 DIAGNOSIS — R2689 Other abnormalities of gait and mobility: Secondary | ICD-10-CM | POA: Diagnosis not present

## 2023-09-23 DIAGNOSIS — S82142D Displaced bicondylar fracture of left tibia, subsequent encounter for closed fracture with routine healing: Secondary | ICD-10-CM | POA: Diagnosis not present

## 2023-09-23 DIAGNOSIS — M6281 Muscle weakness (generalized): Secondary | ICD-10-CM | POA: Diagnosis not present

## 2023-09-25 DIAGNOSIS — M6281 Muscle weakness (generalized): Secondary | ICD-10-CM | POA: Diagnosis not present

## 2023-09-25 DIAGNOSIS — R2689 Other abnormalities of gait and mobility: Secondary | ICD-10-CM | POA: Diagnosis not present

## 2023-09-25 DIAGNOSIS — S82142D Displaced bicondylar fracture of left tibia, subsequent encounter for closed fracture with routine healing: Secondary | ICD-10-CM | POA: Diagnosis not present

## 2023-09-28 DIAGNOSIS — R2689 Other abnormalities of gait and mobility: Secondary | ICD-10-CM | POA: Diagnosis not present

## 2023-09-28 DIAGNOSIS — S82142D Displaced bicondylar fracture of left tibia, subsequent encounter for closed fracture with routine healing: Secondary | ICD-10-CM | POA: Diagnosis not present

## 2023-09-28 DIAGNOSIS — M6281 Muscle weakness (generalized): Secondary | ICD-10-CM | POA: Diagnosis not present

## 2023-10-02 DIAGNOSIS — R2689 Other abnormalities of gait and mobility: Secondary | ICD-10-CM | POA: Diagnosis not present

## 2023-10-02 DIAGNOSIS — S82142D Displaced bicondylar fracture of left tibia, subsequent encounter for closed fracture with routine healing: Secondary | ICD-10-CM | POA: Diagnosis not present

## 2023-10-02 DIAGNOSIS — M6281 Muscle weakness (generalized): Secondary | ICD-10-CM | POA: Diagnosis not present

## 2023-10-20 DIAGNOSIS — S82142A Displaced bicondylar fracture of left tibia, initial encounter for closed fracture: Secondary | ICD-10-CM | POA: Diagnosis not present

## 2023-10-22 DIAGNOSIS — Z4689 Encounter for fitting and adjustment of other specified devices: Secondary | ICD-10-CM | POA: Diagnosis not present

## 2023-10-22 DIAGNOSIS — N958 Other specified menopausal and perimenopausal disorders: Secondary | ICD-10-CM | POA: Diagnosis not present

## 2023-10-22 DIAGNOSIS — I10 Essential (primary) hypertension: Secondary | ICD-10-CM | POA: Diagnosis not present

## 2023-10-27 DIAGNOSIS — Z85828 Personal history of other malignant neoplasm of skin: Secondary | ICD-10-CM | POA: Diagnosis not present

## 2023-10-27 DIAGNOSIS — C44722 Squamous cell carcinoma of skin of right lower limb, including hip: Secondary | ICD-10-CM | POA: Diagnosis not present

## 2023-10-27 DIAGNOSIS — L570 Actinic keratosis: Secondary | ICD-10-CM | POA: Diagnosis not present

## 2023-10-27 DIAGNOSIS — L309 Dermatitis, unspecified: Secondary | ICD-10-CM | POA: Diagnosis not present

## 2023-10-29 DIAGNOSIS — Z1331 Encounter for screening for depression: Secondary | ICD-10-CM | POA: Diagnosis not present

## 2023-10-29 DIAGNOSIS — K9089 Other intestinal malabsorption: Secondary | ICD-10-CM | POA: Diagnosis not present

## 2023-10-29 DIAGNOSIS — Z0000 Encounter for general adult medical examination without abnormal findings: Secondary | ICD-10-CM | POA: Diagnosis not present

## 2023-10-29 DIAGNOSIS — Z79899 Other long term (current) drug therapy: Secondary | ICD-10-CM | POA: Diagnosis not present

## 2023-10-29 DIAGNOSIS — I10 Essential (primary) hypertension: Secondary | ICD-10-CM | POA: Diagnosis not present

## 2023-10-29 DIAGNOSIS — B0229 Other postherpetic nervous system involvement: Secondary | ICD-10-CM | POA: Diagnosis not present

## 2023-10-29 DIAGNOSIS — N819 Female genital prolapse, unspecified: Secondary | ICD-10-CM | POA: Diagnosis not present

## 2023-10-29 DIAGNOSIS — H353231 Exudative age-related macular degeneration, bilateral, with active choroidal neovascularization: Secondary | ICD-10-CM | POA: Diagnosis not present

## 2023-10-29 DIAGNOSIS — R1030 Lower abdominal pain, unspecified: Secondary | ICD-10-CM | POA: Diagnosis not present

## 2023-10-29 DIAGNOSIS — E7800 Pure hypercholesterolemia, unspecified: Secondary | ICD-10-CM | POA: Diagnosis not present

## 2023-10-29 DIAGNOSIS — K219 Gastro-esophageal reflux disease without esophagitis: Secondary | ICD-10-CM | POA: Diagnosis not present

## 2023-11-17 DIAGNOSIS — E871 Hypo-osmolality and hyponatremia: Secondary | ICD-10-CM | POA: Diagnosis not present

## 2023-11-17 DIAGNOSIS — Z23 Encounter for immunization: Secondary | ICD-10-CM | POA: Diagnosis not present

## 2023-11-30 DIAGNOSIS — N390 Urinary tract infection, site not specified: Secondary | ICD-10-CM | POA: Diagnosis not present

## 2023-11-30 DIAGNOSIS — N958 Other specified menopausal and perimenopausal disorders: Secondary | ICD-10-CM | POA: Diagnosis not present

## 2023-11-30 DIAGNOSIS — Z4689 Encounter for fitting and adjustment of other specified devices: Secondary | ICD-10-CM | POA: Diagnosis not present

## 2023-12-21 ENCOUNTER — Encounter (INDEPENDENT_AMBULATORY_CARE_PROVIDER_SITE_OTHER): Admitting: Ophthalmology

## 2023-12-21 DIAGNOSIS — H35033 Hypertensive retinopathy, bilateral: Secondary | ICD-10-CM | POA: Diagnosis not present

## 2023-12-21 DIAGNOSIS — H43813 Vitreous degeneration, bilateral: Secondary | ICD-10-CM

## 2023-12-21 DIAGNOSIS — H353231 Exudative age-related macular degeneration, bilateral, with active choroidal neovascularization: Secondary | ICD-10-CM | POA: Diagnosis not present

## 2023-12-21 DIAGNOSIS — I10 Essential (primary) hypertension: Secondary | ICD-10-CM

## 2023-12-22 ENCOUNTER — Encounter (INDEPENDENT_AMBULATORY_CARE_PROVIDER_SITE_OTHER): Admitting: Ophthalmology

## 2024-01-07 ENCOUNTER — Encounter: Payer: Self-pay | Admitting: Internal Medicine

## 2024-01-07 ENCOUNTER — Ambulatory Visit: Admitting: Internal Medicine

## 2024-01-07 NOTE — Progress Notes (Unsigned)
 Office Visit  Subjective   Patient ID: [name] [name]   DOB: [birth year]   Age: 89 y.o.   MRN: [id number]   Chief Complaint Chief Complaint  Patient presents with   office visit    Patient is here for right ear wax build up     History of Present Illness The patient states she went to the audiologist last week where they noted wax in her right ear.  She only had a hearing aid in her left ear.  They asked her to come in today to have the wax removed from her right ear.  She states she does not have any symptoms from this wax.  She has post herpetic neuralgia of her left side of her face.     Past Medical History Past Medical History:  Diagnosis Date   Abnormal albumin    LOW   HTN (hypertension)    Hyperlipidemia    Hypertension    Hypokalemia    Hypomagnesemia    Moderate anxiety    SERVE STRESS   Numbness on right side    SECONDARY TO ELECTROLYTES ABNORMALITIES     Allergies Allergies  Allergen Reactions   Gabapentin Other (See Comments)    Psychosis;felt like I was out of my mind) per patient    Hyoscyamine     Other Reaction(s): dizziness, Dizziness  Blurred vision  hyoscyamine   Demerol Other (See Comments)    Passed out   Iodine  Other (See Comments)   Iohexol      Code: RASH, Desc: Pt states she had an IVP 15-75yrs ago and broke out in a rash all over her body.  She's never had IV contrast since., Onset Date: [id number]    Oxycodone     N/V   Suprax [Cefixime] Other (See Comments)    c diff   Latex Rash   Penicillins Rash     Medications  Current Outpatient Medications:    acetaminophen  (TYLENOL ) 500 MG tablet, Take 1,000 mg by mouth every 6 (six) hours as needed., Disp: , Rfl:    amLODipine  (NORVASC ) 10 MG tablet, Take 10 mg by mouth at bedtime., Disp: , Rfl:    BESIVANCE 0.6 % SUSP, Place 1 drop into both eyes in the morning, at noon, in the evening, and at bedtime. Starts 2 days after eye injection every 6 weeks, Disp: , Rfl:    Calcium   Carbonate (CALCIUM  600 PO), Take 1 tablet by mouth 2 (two) times daily., Disp: , Rfl:    cephALEXin  (KEFLEX ) 500 MG capsule, Take 1 capsule (500 mg total) by mouth 4 (four) times daily., Disp: 28 capsule, Rfl: 0   Cholecalciferol 25 MCG (1000 UT) CHEW, Chew 1,000 Units by mouth 2 (two) times a week. Take on Mon and Wed, Disp: , Rfl:    Coenzyme Q-10 100 MG capsule, Take 100 mg by mouth daily., Disp: , Rfl:    cyanocobalamin  (VITAMIN B12) 500 MCG tablet, Take 500 mcg by mouth 2 (two) times a week. Takes on Mon and Fri, Disp: , Rfl:    estradiol  (ESTRACE ) 0.1 MG/GM vaginal cream, Place 1 Applicatorful vaginally 3 (three) times a week. Helps prevent UTI, Disp: , Rfl:    hypromellose (GENTEAL) 0.3 % GEL ophthalmic ointment, Place 1 Application into both eyes at bedtime., Disp: , Rfl:    lisinopril  (ZESTRIL ) 40 MG tablet, Take 40 mg by mouth daily., Disp: , Rfl:    meloxicam (MOBIC) 7.5 MG tablet, Take 7.5 mg by mouth  daily., Disp: , Rfl:    mometasone (NASONEX) 50 MCG/ACT nasal spray, Place 2 sprays into the nose daily., Disp: , Rfl:    Multiple Vitamin (MULTIVITAMIN WITH MINERALS) TABS, Take 1 tablet by mouth every other day., Disp: , Rfl:    Omega-3 1000 MG CAPS, Take 1 g by mouth daily., Disp: , Rfl:    omeprazole  (PRILOSEC  OTC) 20 MG tablet, Take 20 mg by mouth daily., Disp: , Rfl:    Polyethyl Glycol-Propyl Glycol (SYSTANE ULTRA) 0.4-0.3 % SOLN, Place 1 drop into both eyes in the morning., Disp: , Rfl:    polyethylene glycol (MIRALAX  / GLYCOLAX ) 17 g packet, Take 17 g by mouth daily., Disp: 14 each, Rfl: 0   rosuvastatin  (CRESTOR ) 10 MG tablet, Take 10 mg by mouth every other day., Disp: , Rfl:    saccharomyces boulardii (FLORASTOR) 250 MG capsule, Take 1 capsule by mouth every other day., Disp: , Rfl:    Review of Systems Review of Systems  Constitutional:  Negative for chills and fever.  HENT:  Positive for hearing loss. Negative for ear discharge, ear pain and tinnitus.   Gastrointestinal:   Negative for diarrhea, nausea and vomiting.  Neurological:  Negative for dizziness, weakness and headaches.       Objective:    Vitals BP 130/80   Pulse 86   Temp (!) 97.2 F (36.2 C)   Resp 18   Wt 102 lb (46.3 kg)   SpO2 92%   BMI 18.66 kg/m    Physical Examination Physical Exam     Assessment & Plan:   No problem-specific Assessment & Plan notes found for this encounter.    No follow-ups on file.   Selinda Fleeta Finger, MD

## 2024-03-22 ENCOUNTER — Encounter (INDEPENDENT_AMBULATORY_CARE_PROVIDER_SITE_OTHER): Admitting: Ophthalmology

## 2024-03-23 ENCOUNTER — Encounter (INDEPENDENT_AMBULATORY_CARE_PROVIDER_SITE_OTHER): Admitting: Ophthalmology

## 2024-03-29 ENCOUNTER — Encounter (INDEPENDENT_AMBULATORY_CARE_PROVIDER_SITE_OTHER): Admitting: Ophthalmology
